# Patient Record
Sex: Female | Born: 1965 | Race: White | Hispanic: No | Smoking: Current every day smoker
Health system: Southern US, Community
[De-identification: ages and names within clinical notes are randomized; demographics above are authoritative.]

## PROBLEM LIST (undated history)

## (undated) DIAGNOSIS — I10 Essential (primary) hypertension: Secondary | ICD-10-CM

## (undated) DIAGNOSIS — F1010 Alcohol abuse, uncomplicated: Secondary | ICD-10-CM

## (undated) DIAGNOSIS — F329 Major depressive disorder, single episode, unspecified: Secondary | ICD-10-CM

## (undated) DIAGNOSIS — F32A Depression, unspecified: Secondary | ICD-10-CM

## (undated) DIAGNOSIS — N289 Disorder of kidney and ureter, unspecified: Secondary | ICD-10-CM

## (undated) HISTORY — PX: BREAST SURGERY: SHX581

## (undated) HISTORY — PX: OTHER SURGICAL HISTORY: SHX169

---

## 2009-09-08 ENCOUNTER — Ambulatory Visit (HOSPITAL_COMMUNITY): Admission: RE | Admit: 2009-09-08 | Discharge: 2009-09-08 | Payer: Self-pay | Admitting: General Surgery

## 2010-05-06 ENCOUNTER — Encounter: Payer: Self-pay | Admitting: Orthopaedic Surgery

## 2010-07-02 LAB — BASIC METABOLIC PANEL
BUN: 10 mg/dL (ref 6–23)
BUN: 13 mg/dL (ref 6–23)
CO2: 29 mEq/L (ref 19–32)
Calcium: 9.6 mg/dL (ref 8.4–10.5)
Chloride: 103 mEq/L (ref 96–112)
Creatinine, Ser: 0.71 mg/dL (ref 0.4–1.2)
GFR calc non Af Amer: >60 mL/min (ref >60)
Glucose, Bld: 101 mg/dL — ABNORMAL HIGH (ref 70–99)
Glucose, Bld: 107 mg/dL — ABNORMAL HIGH (ref 70–99)
Potassium: 4 mEq/L (ref 3.5–5.1)
Sodium: 139 mEq/L (ref 135–145)

## 2010-07-02 LAB — CULTURE, ROUTINE-ABSCESS

## 2010-07-02 LAB — ANAEROBIC CULTURE

## 2010-09-28 ENCOUNTER — Other Ambulatory Visit (HOSPITAL_COMMUNITY): Payer: Self-pay | Admitting: Family Medicine

## 2010-09-28 DIAGNOSIS — N6320 Unspecified lump in the left breast, unspecified quadrant: Secondary | ICD-10-CM

## 2010-10-24 ENCOUNTER — Encounter (HOSPITAL_COMMUNITY): Payer: Self-pay

## 2011-09-24 ENCOUNTER — Emergency Department (HOSPITAL_COMMUNITY)
Admission: EM | Admit: 2011-09-24 | Discharge: 2011-09-24 | Disposition: A | Discharge status: Home / Self Care | Payer: Self-pay | Attending: Emergency Medicine | Admitting: Emergency Medicine

## 2011-09-24 ENCOUNTER — Encounter (HOSPITAL_COMMUNITY): Payer: Self-pay | Admitting: *Deleted

## 2011-09-24 DIAGNOSIS — R000 Tachycardia, unspecified: Secondary | ICD-10-CM | POA: Insufficient documentation

## 2011-09-24 DIAGNOSIS — F17200 Nicotine dependence, unspecified, uncomplicated: Secondary | ICD-10-CM | POA: Insufficient documentation

## 2011-09-24 DIAGNOSIS — N289 Disorder of kidney and ureter, unspecified: Secondary | ICD-10-CM | POA: Insufficient documentation

## 2011-09-24 DIAGNOSIS — F1010 Alcohol abuse, uncomplicated: Secondary | ICD-10-CM | POA: Insufficient documentation

## 2011-09-24 DIAGNOSIS — F1020 Alcohol dependence, uncomplicated: Secondary | ICD-10-CM

## 2011-09-24 DIAGNOSIS — I10 Essential (primary) hypertension: Secondary | ICD-10-CM | POA: Insufficient documentation

## 2011-09-24 HISTORY — DX: Disorder of kidney and ureter, unspecified: N28.9

## 2011-09-24 HISTORY — DX: Essential (primary) hypertension: I10

## 2011-09-24 LAB — COMPREHENSIVE METABOLIC PANEL
BUN: 10 mg/dL (ref 6–23)
CO2: 25 mEq/L (ref 19–32)
Chloride: 100 mEq/L (ref 96–112)
Creatinine, Ser: 0.59 mg/dL (ref 0.50–1.10)
GFR calc non Af Amer: >90 mL/min (ref >90)
Glucose, Bld: 101 mg/dL — ABNORMAL HIGH (ref 70–99)
Total Bilirubin: 0.3 mg/dL (ref 0.3–1.2)

## 2011-09-24 LAB — RAPID URINE DRUG SCREEN, HOSP PERFORMED
Opiates: NOT DETECTED
Tetrahydrocannabinol: NOT DETECTED

## 2011-09-24 LAB — URINALYSIS, ROUTINE W REFLEX MICROSCOPIC
Hgb urine dipstick: NEGATIVE
Leukocytes, UA: NEGATIVE
Protein, ur: NEGATIVE mg/dL
Urobilinogen, UA: 0.2 mg/dL (ref 0.0–1.0)

## 2011-09-24 LAB — DIFFERENTIAL
Lymphocytes Relative: 43 % (ref 12–46)
Monocytes Absolute: 0.6 10*3/uL (ref 0.1–1.0)
Monocytes Relative: 8 % (ref 3–12)
Neutro Abs: 3.6 10*3/uL (ref 1.7–7.7)

## 2011-09-24 LAB — ETHANOL: Alcohol, Ethyl (B): 129 mg/dL — ABNORMAL HIGH (ref 0–11)

## 2011-09-24 LAB — CBC
HCT: 41.8 % (ref 36.0–46.0)
Hemoglobin: 14.4 g/dL (ref 12.0–15.0)
WBC: 8.1 10*3/uL (ref 4.0–10.5)

## 2011-09-24 MED ORDER — LORAZEPAM 2 MG/ML IJ SOLN
1.0000 mg | Freq: Once | INTRAMUSCULAR | Status: AC
  Administered 2011-09-24: 1 mg via INTRAVENOUS
  Filled 2011-09-24: qty 1

## 2011-09-24 MED ORDER — SODIUM CHLORIDE 0.9 % IV BOLUS (SEPSIS)
1000.0000 mL | Freq: Once | INTRAVENOUS | Status: AC
  Administered 2011-09-24: 1000 mL via INTRAVENOUS

## 2011-09-24 NOTE — ED Notes (Signed)
Sandy called at [facility]. Facility will expect pt in 1 hours time.

## 2011-09-24 NOTE — ED Notes (Signed)
Pt out to desk asking again if husband will be taking her, what the plan was and if she needed to stay. Pt continues to slur her words as well as smell of alcohol on her breath. Pt was cooperative and returned to her room when plan was discussed.

## 2011-09-24 NOTE — Progress Notes (Signed)
CALLED SANDY AT [facility]. PT IS ACCEPTED. CALLED KATIE OF [facility] WHO AUTHORIZED3 DAYS #[id number]. DR Ranae Palms AGREES WITH DISPOSITION.

## 2011-09-24 NOTE — ED Notes (Signed)
Pt is discharged to husband's care, to be transported to [facility] per ACT team acceptance in this program

## 2011-09-24 NOTE — ED Notes (Signed)
Pt out to desk asking if she could leave. Pt states "must be outside waiting for husband when he arrives as he has 4 children under the age of 63 with him".  Pt was asked to wait in her room, she asked to wait in the lobby. Pt quietly went back to her room on the third request.

## 2011-09-24 NOTE — BH Assessment (Signed)
Assessment Note   Kaylee Ochoa is an 46 y.o. female. The patient came to the ED requesting detox from alcohol She states that she has been drinking 4-6 glasses of wine daily for the last 20 years. She has had no period of sobriety. This has caused legal problems(DUI&DWI), job loss, loss of friends, and alienated  Her with her husband. She has been married 5 years, they have adopted 4 children. She reports that she has ruined many vacations by drinking. She is not suicidal and has no history of suicide attempts. She is not homicidal and has no history of violence. She is not psychotic. She has no history of seizures. She has spoken to [facility] earlier today and is requesting that she be referred there. Spoke With staff at [facility] and will fax referral information to them for review  Axis I: Alcohol Dependence; Substance induced mood disorder Axis II: Deferred Axis III:  Past Medical History  Diagnosis Date  . Hypertension   . Renal disorder    Axis IV: occupational problems and problems with primary support group Axis V: 41-50 serious symptoms  Past Medical History:  Past Medical History  Diagnosis Date  . Hypertension   . Renal disorder     History reviewed. No pertinent past surgical history.  Family History: History reviewed. No pertinent family history.  Social History:  reports that she has been smoking.  She does not have any smokeless tobacco history on file. She reports that she drinks alcohol. She reports that she does not use illicit drugs.  Additional Social History:     CIWA: CIWA-Ar BP: 153/92 mmHg Pulse Rate: 85  COWS:    Allergies:  Allergies  Allergen Reactions  . Other Hives, Itching and Rash    UNKNOWN ANTIBIOTIC: Patient cannot recall name of the medication but it casued a rash all over the body, hives, and itching    Home Medications:  (Not in a hospital admission)  OB/GYN Status:  Patient's last menstrual period was 09/10/2011.  General Assessment  Data Location of Assessment: [facility] ACT Assessment: Yes Living Arrangements: Spouse/significant other;Children Can pt return to current living arrangement?: Yes Admission Status: Voluntary Is patient capable of signing voluntary admission?: Yes Transfer from: Acute Hospital Referral Source: MD  Education Status Is patient currently in school?: No  Risk to self Suicidal Ideation: No Suicidal Intent: No Is patient at risk for suicide?: No Suicidal Plan?: No Access to Means: No What has been your use of drugs/alcohol within the last 12 months?: etoh daily for 20 years-2-6 glasses of wine daily Previous Attempts/Gestures: No How many times?: 0  Other Self Harm Risks: drinking Triggers for Past Attempts: None known Intentional Self Injurious Behavior: None Family Suicide History: No Recent stressful life event(s): Conflict (Comment);Job Loss;Turmoil (Comment) (conflicy with spouse over drinking) Persecutory voices/beliefs?: No Depression: Yes Depression Symptoms: Insomnia;Tearfulness;Isolating;Loss of interest in usual pleasures;Feeling worthless/self pity Substance abuse history and/or treatment for substance abuse?: Yes Suicide prevention information given to non-admitted patients: Yes  Risk to Others Homicidal Ideation: No Thoughts of Harm to Others: No Current Homicidal Intent: No Current Homicidal Plan: No Access to Homicidal Means: No History of harm to others?: No Assessment of Violence: None Noted Does patient have access to weapons?: No Criminal Charges Pending?: No Does patient have a court date: No  Psychosis Hallucinations: None noted Delusions: None noted  Mental Status Report Appear/Hygiene: Improved Eye Contact: Good Motor Activity: Restlessness Speech: Logical/coherent;Soft Level of Consciousness: Alert;Crying Mood: Depressed;Anxious;Guilty;Fearful Affect: Anxious;Depressed;Fearful  Anxiety Level: Minimal Thought Processes:  Coherent;Relevant Judgement: Unimpaired Orientation: Person;Place;Time;Situation Obsessive Compulsive Thoughts/Behaviors: Minimal  Cognitive Functioning Concentration: Decreased Memory: Recent Intact;Remote Intact IQ: Average Insight: Poor Impulse Control: Poor Appetite: Fair Sleep: No Change Vegetative Symptoms: None  ADLScreening [facility] Assessment Services) Patient's cognitive ability adequate to safely complete daily activities?: Yes Patient able to express need for assistance with ADLs?: Yes Independently performs ADLs?: Yes  Abuse/Neglect [facility]) Physical Abuse: Denies Verbal Abuse: Denies Sexual Abuse: Denies  Prior Inpatient Therapy Prior Inpatient Therapy: No  Prior Outpatient Therapy Prior Outpatient Therapy: Yes Prior Therapy Facilty/Provider(s): Twice in [place];1 in [place] [facility] Reason for Treatment: IOP  ADL Screening (condition at time of admission) Patient's cognitive ability adequate to safely complete daily activities?: Yes Patient able to express need for assistance with ADLs?: Yes Independently performs ADLs?: Yes       Abuse/Neglect Assessment (Assessment to be complete while patient is alone) Physical Abuse: Denies Verbal Abuse: Denies Sexual Abuse: Denies Values / Beliefs Cultural Requests During Hospitalization: None Spiritual Requests During Hospitalization: None        Additional Information 1:1 In Past 12 Months?: No CIRT Risk: No Elopement Risk: No Does patient have medical clearance?: Yes     Disposition:  Disposition Disposition of Patient: Inpatient treatment program;Referred to Patient referred to: [facility]  On Site Evaluation by:   Reviewed with Physician:     Jearld Pies 09/24/2011 4:16 PM

## 2011-09-24 NOTE — ED Provider Notes (Addendum)
History  This chart was scribed for [name], MD by Bennett Scrape. This patient was seen in room APA08/[facility]08 and the patient's care was started at 2:11PM.  CSN: [id number]  Arrival date & time 09/24/11  1342   First MD Initiated Contact with Patient 09/24/11 1411      No chief complaint on file.   The history is provided by the patient. No language interpreter was used.    Kaylee Ochoa is a 46 y.o. female who presents to the Emergency Department requesting help with EtOH detox. Pt states that she has been consuming 4 to 6 glasses of wine a day for the past 20 years. She reports that she has tried detox 10 times before without success. She states that she had half a glass of wine around 1PM today. She denies SI or hallucinations. She denies having a h/o psychiatric  problems. She states that she has a h/o HTN but does not take medications for it. She is a current everyday smoker.  Past Medical History  Diagnosis Date  . Hypertension   . Renal disorder     History reviewed. No pertinent past surgical history.  History reviewed. No pertinent family history.  History  Substance Use Topics  . Smoking status: Current Everyday Smoker  . Smokeless tobacco: Not on file  . Alcohol Use: Yes    Review of Systems  Constitutional: Negative for fatigue.  HENT: Negative for congestion, sinus pressure and ear discharge.   Eyes: Negative for discharge.  Respiratory: Negative for cough.   Cardiovascular: Negative for chest pain.  Gastrointestinal: Negative for abdominal pain and diarrhea.  Genitourinary: Negative for frequency and hematuria.  Musculoskeletal: Negative for back pain.  Skin: Negative for rash.  Neurological: Negative for seizures and headaches.  Hematological: Negative.   Psychiatric/Behavioral: Negative for suicidal ideas and hallucinations. The patient is nervous/anxious.     Allergies  Review of patient's allergies indicates not on file.  Home  Medications  No current outpatient prescriptions on file.  Triage Vitals: BP 184/114  Pulse 110  Temp(Src) 98.7 F (37.1 C) (Oral)  Resp 20  Ht 5' 7.5" (1.715 m)  Wt 131 lb (59.421 kg)  BMI 20.21 kg/m2  SpO2 98%  LMP 09/10/2011  Physical Exam  Nursing note and vitals reviewed. Constitutional: She is oriented to person, place, and time. She appears well-developed and well-nourished.       Mild tremors  HENT:  Head: Normocephalic and atraumatic.  Eyes: Conjunctivae and EOM are normal. No scleral icterus.  Neck: Neck supple.  Cardiovascular: Regular rhythm.  Tachycardia present.  Exam reveals no gallop and no friction rub.   No murmur heard. Pulmonary/Chest: Effort normal and breath sounds normal.  Abdominal: Soft. She exhibits no distension.  Musculoskeletal: Normal range of motion. She exhibits no edema.  Neurological: She is alert and oriented to person, place, and time. Coordination normal.  Skin: No rash noted. No erythema.  Psychiatric: Her behavior is normal. Her mood appears anxious.    ED Course  Procedures (including critical care time)  DIAGNOSTIC STUDIES: Oxygen Saturation is 98% on room air, normal by my interpretation.    COORDINATION OF CARE: 2:19PM-Discussed treatment plan with pt and pt agreed to plan. 3:55PM-Per ACT team member, pt is having withdrawals.   Labs Reviewed  COMPREHENSIVE METABOLIC PANEL - Abnormal; Notable for the following:    Glucose, Bld 101 (*)    AST 57 (*)    All other components within normal limits  URINALYSIS,  ROUTINE W REFLEX MICROSCOPIC - Abnormal; Notable for the following:    Specific Gravity, Urine <1.005 (*)    All other components within normal limits  ETHANOL - Abnormal; Notable for the following:    Alcohol, Ethyl (B) 239 (*)    All other components within normal limits  CBC  DIFFERENTIAL  URINE RAPID DRUG SCREEN (HOSP PERFORMED)   No results found.   No diagnosis found.    MDM  I personally performed the  services described in this documentation, which was scribed in my presence. The recorded information has been reviewed and considered.  Awaiting rehab placement.       [name], MD 09/24/11 2542  [name], MD 09/30/11 [phone]

## 2011-09-24 NOTE — ED Notes (Signed)
ACT team member notified of alcohol level results.

## 2011-09-24 NOTE — ED Notes (Signed)
Kaylee Ochoa From the ACT team at bedside talking to pt.

## 2011-09-24 NOTE — ED Notes (Signed)
Pt here for detox from etoh.  Drinks 4-6 glasses of wine daily.  "my husband wants me to detox"

## 2012-05-26 ENCOUNTER — Emergency Department (HOSPITAL_COMMUNITY)
Admission: EM | Admit: 2012-05-26 | Discharge: 2012-05-26 | Disposition: A | Discharge status: Home / Self Care | Payer: BC Managed Care – PPO | Source: Home / Self Care | Attending: Emergency Medicine | Admitting: Emergency Medicine

## 2012-05-26 ENCOUNTER — Emergency Department (HOSPITAL_COMMUNITY)
Admission: EM | Admit: 2012-05-26 | Discharge: 2012-05-26 | Disposition: A | Discharge status: Other Acute Inpatient Hospital | Payer: BC Managed Care – PPO | Attending: Emergency Medicine | Admitting: Emergency Medicine

## 2012-05-26 ENCOUNTER — Inpatient Hospital Stay (HOSPITAL_COMMUNITY)
Admission: EM | Admit: 2012-05-26 | Discharge: 2012-05-27 | DRG: 897 | Disposition: A | Discharge status: Home / Self Care | Payer: 59 | Source: Intra-hospital | Attending: Psychiatry | Admitting: Psychiatry

## 2012-05-26 ENCOUNTER — Encounter (HOSPITAL_COMMUNITY): Payer: Self-pay | Admitting: Emergency Medicine

## 2012-05-26 ENCOUNTER — Encounter (HOSPITAL_COMMUNITY): Payer: Self-pay

## 2012-05-26 DIAGNOSIS — I10 Essential (primary) hypertension: Secondary | ICD-10-CM | POA: Diagnosis present

## 2012-05-26 DIAGNOSIS — F3289 Other specified depressive episodes: Secondary | ICD-10-CM | POA: Insufficient documentation

## 2012-05-26 DIAGNOSIS — Z79899 Other long term (current) drug therapy: Secondary | ICD-10-CM | POA: Insufficient documentation

## 2012-05-26 DIAGNOSIS — R45851 Suicidal ideations: Secondary | ICD-10-CM | POA: Insufficient documentation

## 2012-05-26 DIAGNOSIS — F17200 Nicotine dependence, unspecified, uncomplicated: Secondary | ICD-10-CM | POA: Insufficient documentation

## 2012-05-26 DIAGNOSIS — F1010 Alcohol abuse, uncomplicated: Secondary | ICD-10-CM | POA: Insufficient documentation

## 2012-05-26 DIAGNOSIS — F10239 Alcohol dependence with withdrawal, unspecified: Principal | ICD-10-CM | POA: Diagnosis present

## 2012-05-26 DIAGNOSIS — Z8659 Personal history of other mental and behavioral disorders: Secondary | ICD-10-CM | POA: Insufficient documentation

## 2012-05-26 DIAGNOSIS — Z87448 Personal history of other diseases of urinary system: Secondary | ICD-10-CM | POA: Insufficient documentation

## 2012-05-26 DIAGNOSIS — F10939 Alcohol use, unspecified with withdrawal, unspecified: Principal | ICD-10-CM | POA: Diagnosis present

## 2012-05-26 DIAGNOSIS — N289 Disorder of kidney and ureter, unspecified: Secondary | ICD-10-CM | POA: Insufficient documentation

## 2012-05-26 DIAGNOSIS — F1020 Alcohol dependence, uncomplicated: Secondary | ICD-10-CM | POA: Diagnosis present

## 2012-05-26 DIAGNOSIS — F329 Major depressive disorder, single episode, unspecified: Secondary | ICD-10-CM | POA: Insufficient documentation

## 2012-05-26 HISTORY — DX: Depression, unspecified: F32.A

## 2012-05-26 HISTORY — DX: Major depressive disorder, single episode, unspecified: F32.9

## 2012-05-26 LAB — COMPREHENSIVE METABOLIC PANEL
ALT: 15 U/L (ref 0–35)
AST: 33 U/L (ref 0–37)
Alkaline Phosphatase: 77 U/L (ref 39–117)
CO2: 26 mEq/L (ref 19–32)
Chloride: 102 mEq/L (ref 96–112)
GFR calc non Af Amer: >90 mL/min (ref >90)
Potassium: 4.1 mEq/L (ref 3.5–5.1)
Sodium: 141 mEq/L (ref 135–145)
Total Bilirubin: 0.4 mg/dL (ref 0.3–1.2)

## 2012-05-26 LAB — RAPID URINE DRUG SCREEN, HOSP PERFORMED
Amphetamines: NOT DETECTED
Barbiturates: NOT DETECTED
Tetrahydrocannabinol: NOT DETECTED

## 2012-05-26 LAB — CBC WITH DIFFERENTIAL/PLATELET
Basophils Absolute: 0.1 10*3/uL (ref 0.0–0.1)
HCT: 44.2 % (ref 36.0–46.0)
Lymphocytes Relative: 43 % (ref 12–46)
Monocytes Absolute: 0.6 10*3/uL (ref 0.1–1.0)
Neutro Abs: 3.2 10*3/uL (ref 1.7–7.7)
Neutrophils Relative %: 46 % (ref 43–77)
RDW: 12.8 % (ref 11.5–15.5)
WBC: 7 10*3/uL (ref 4.0–10.5)

## 2012-05-26 MED ORDER — ALBUTEROL SULFATE HFA 108 (90 BASE) MCG/ACT IN AERS: 2.0000 | INHALATION_SPRAY | Freq: Four times a day (QID) | RESPIRATORY_TRACT | Status: DC | PRN

## 2012-05-26 MED ORDER — THIAMINE HCL 100 MG/ML IJ SOLN: 100.0000 mg | Freq: Every day | INTRAMUSCULAR | Status: DC

## 2012-05-26 MED ORDER — LORAZEPAM 1 MG PO TABS
0.0000 mg | ORAL_TABLET | Freq: Four times a day (QID) | ORAL | Status: DC
  Administered 2012-05-26: 1 mg via ORAL
  Filled 2012-05-26: qty 1

## 2012-05-26 MED ORDER — ONDANSETRON 4 MG PO TBDP
4.0000 mg | ORAL_TABLET | Freq: Once | ORAL | Status: AC
  Administered 2012-05-26: 4 mg via ORAL
  Filled 2012-05-26: qty 1

## 2012-05-26 MED ORDER — VITAMIN B-1 100 MG PO TABS
100.0000 mg | ORAL_TABLET | Freq: Every day | ORAL | Status: DC
  Administered 2012-05-26: 100 mg via ORAL
  Filled 2012-05-26: qty 1

## 2012-05-26 MED ORDER — LORAZEPAM 1 MG PO TABS: 0.0000 mg | ORAL_TABLET | Freq: Two times a day (BID) | ORAL | Status: DC

## 2012-05-26 MED ORDER — LORAZEPAM 2 MG/ML IJ SOLN: 1.0000 mg | Freq: Four times a day (QID) | INTRAMUSCULAR | Status: DC | PRN

## 2012-05-26 MED ORDER — SODIUM CHLORIDE 0.9 % IV BOLUS (SEPSIS): 1000.0000 mL | INTRAVENOUS | Status: DC

## 2012-05-26 MED ORDER — FOLIC ACID 1 MG PO TABS
1.0000 mg | ORAL_TABLET | Freq: Every day | ORAL | Status: DC
  Administered 2012-05-26: 1 mg via ORAL
  Filled 2012-05-26: qty 1

## 2012-05-26 MED ORDER — LORAZEPAM 1 MG PO TABS
1.0000 mg | ORAL_TABLET | Freq: Four times a day (QID) | ORAL | Status: DC | PRN
  Administered 2012-05-26: 1 mg via ORAL
  Filled 2012-05-26: qty 1

## 2012-05-26 MED ORDER — ADULT MULTIVITAMIN W/MINERALS CH
1.0000 | ORAL_TABLET | Freq: Every day | ORAL | Status: DC
  Administered 2012-05-26: 1 via ORAL
  Filled 2012-05-26: qty 1

## 2012-05-26 NOTE — ED Provider Notes (Signed)
Medical screening examination/treatment/procedure(s) were performed by non-physician practitioner and as supervising physician I was immediately available for consultation/collaboration.   Kassey Laforest, MD 05/26/12 2329 

## 2012-05-26 NOTE — ED Notes (Signed)
Was here earlier via EMS and labs were being resulted when pt stated that she didn't want to stay. Rob PA-C tried to talk pt into going through detox program here and she declined. Pt returned after about 30 mins and stated she was ready to go through the program.

## 2012-05-26 NOTE — ED Provider Notes (Signed)
History  This chart was scribed for non-physician practitioner working with [name], MD by Candelaria Stagers, ED Scribe. This patient was seen in room WTR3/[facility]PT3 and the patient's care was started at 3:46 PM   CSN: [id number]  Arrival date & time 05/26/12  1433   First MD Initiated Contact with Patient 05/26/12 1544      Chief Complaint  Patient presents with   Alcohol Intoxication    .320   Suicidal    PER EMS- stated to staff that she desired to harm self at [facility]     The history is provided by the patient and the EMS personnel. No language interpreter was used.   Kaylee Ochoa is a 47 y.o. female who presents to the Emergency Department BIBA from [facility], a detox center, after she expressed SI to the staff there earlier today.  Pt was at [facility] for ETOH detox.  Pt reports that she drank earlier today before going to the treatment center.  She denies SI/HI and reports she was just upset regarding a fight with her husband when she stated SI this morning.  She expresses desire to return to the treatment center.  She denies any pain at this time.  Pt has h/o HTN.  Pt denies use of recreational drugs.           Past Medical History  Diagnosis Date   Hypertension    Renal disorder     No past surgical history on file.  No family history on file.  History  Substance Use Topics   Smoking status: Current Every Day Smoker   Smokeless tobacco: Not on file   Alcohol Use: Yes    OB History   Grav Para Term Preterm Abortions TAB SAB Ect Mult Living                  Review of Systems  Musculoskeletal: Negative for arthralgias.  Psychiatric/Behavioral: Negative for suicidal ideas, self-injury and agitation.  All other systems reviewed and are negative.    Allergies  Other and Sulfa antibiotics  Home Medications   Current Outpatient Rx  Name  Route  Sig  Dispense  Refill   albuterol (VENTOLIN HFA) 108 (90 BASE) MCG/ACT inhaler  Inhalation   Inhale 2 puffs into the lungs every 6 (six) hours as needed.           BP 155/80   Pulse 94   Temp(Src) 98.2 F (36.8 C)   Resp 18   SpO2 100%   LMP 05/12/2012  Physical Exam  Nursing note and vitals reviewed. Constitutional: She is oriented to person, place, and time. She appears well-developed and well-nourished. No distress.  HENT:  Head: Normocephalic and atraumatic.  Eyes: EOM are normal.  Neck: Neck supple. No tracheal deviation present.  Cardiovascular: Normal rate, regular rhythm and normal heart sounds.  Exam reveals no gallop and no friction rub.   No murmur heard. Pulmonary/Chest: Effort normal and breath sounds normal. No respiratory distress. She has no wheezes. She has no rales. She exhibits no tenderness.  Abdominal: Soft. She exhibits no distension and no mass. There is no tenderness. There is no rebound and no guarding.  Musculoskeletal: Normal range of motion.  Neurological: She is alert and oriented to person, place, and time.  CN 3-12 intact.    Skin: Skin is warm and dry.  Psychiatric: She has a normal mood and affect. Her behavior is normal. Judgment and thought content normal. She expresses  no homicidal and no suicidal ideation. She expresses no suicidal plans and no homicidal plans.    ED Course  Procedures   DIAGNOSTIC STUDIES: Oxygen Saturation is 100% on room air, normal by my interpretation.    COORDINATION OF CARE:  3:37 PM Ordered: CBC with Differential; Comprehensive metabolic panel; Drug screen panel, emergency; Ethanol 3:59 PM Will consult with [name], MD.  Pt understands and agrees.    Labs Reviewed  CBC WITH DIFFERENTIAL - Abnormal; Notable for the following:    Hemoglobin 15.1 (*)    MCV 101.4 (*)    MCH 34.6 (*)    All other components within normal limits  COMPREHENSIVE METABOLIC PANEL  ETHANOL   No results found.   1. Alcohol abuse       MDM  This is a 47 year old female, who presents emergency  department with chief complaint of wanting to detox from alcohol. Patient states that she is being seen at the detox center, when she became frustrated, and said, "I just want to kill myself."  I spent approximately 15-20 minutes talking with the patient. I do not feel that the patient is suicidal at this time. She denies any desire to kill herself or harm anyone. The patient has never tried to commit suicide before. She does not live alone. She has a husband and 4 kids, and states that she would never want to kill herself. She states that she had just become frustrated because of an argument with her husband, and in the heat of the moment said that she wanted to. She does not have a plan. She tells me that she just wants to go back to the treatment center, so that she can finish going through treatment for alcohol abuse and put her life back together.  I discussed this patient with Dr. Patria Mane, who agrees with the plan.  I personally performed the services described in this documentation, which was scribed in my presence. The recorded information has been reviewed and is accurate.        [name], PA-C 05/26/12 [phone]

## 2012-05-26 NOTE — ED Notes (Signed)
Pt very upset states wants rehab, pt now denies SI/HI, pt wanting to leave.

## 2012-05-26 NOTE — ED Notes (Signed)
Patient Informed that we are past due for a urine sample. Patient given a cup of water to drink.

## 2012-05-26 NOTE — ED Notes (Signed)
Upon accepting patient there was past due orders on chart. Written contacted [place], [place] to see if he still wanted 1000 ml bolus. Pa reported that bolus could be cancel. Bolus cancel by Clinical research associate per verbal order. Remaining orders will be perform by writter.

## 2012-05-26 NOTE — ED Provider Notes (Signed)
History  This chart was scribed for non-physician practitioner working with [name], MD by Candelaria Stagers, ED Scribe. This patient was seen in room WTR3/[facility]PT3 and the patient's care was started at 3:46 PM   CSN: [id number]  Arrival date & time 05/26/12  1433   First MD Initiated Contact with Patient 05/26/12 1544      Chief Complaint  Patient presents with  . Alcohol Intoxication    .320  . Suicidal    PER EMS- stated to staff that she desired to harm self at [facility]     Alcohol Intoxication Pertinent negatives include no arthralgias.  Patient is a 47 y.o. female presenting with intoxication. The history is provided by the patient and the EMS personnel. No language interpreter was used.   Kaylee Ochoa is a 47 y.o. female who presents to the Emergency Department BIBA from [facility], a detox center, after she expressed SI to the staff there earlier today.  Pt was at [facility] for ETOH detox.  Pt reports that she drank earlier today before going to the treatment center.  She denies SI/HI and reports she was just upset regarding a fight with her husband when she stated SI this morning.  She expresses desire to return to the treatment center.  She denies any pain at this time.  Pt has h/o HTN.  Pt denies use of recreational drugs.           Past Medical History  Diagnosis Date  . Hypertension   . Renal disorder     No past surgical history on file.  No family history on file.  History  Substance Use Topics  . Smoking status: Current Every Day Smoker  . Smokeless tobacco: Not on file  . Alcohol Use: Yes    OB History   Grav Para Term Preterm Abortions TAB SAB Ect Mult Living                  Review of Systems  Musculoskeletal: Negative for arthralgias.  Psychiatric/Behavioral: Negative for suicidal ideas, self-injury and agitation.  All other systems reviewed and are negative.    Allergies  Other and Sulfa antibiotics  Home  Medications   Current Outpatient Rx  Name  Route  Sig  Dispense  Refill  . albuterol (VENTOLIN HFA) 108 (90 BASE) MCG/ACT inhaler   Inhalation   Inhale 2 puffs into the lungs every 6 (six) hours as needed.           BP 155/80  Pulse 94  Temp(Src) 98.2 F (36.8 C)  Resp 18  SpO2 100%  LMP 05/12/2012  Physical Exam  Nursing note and vitals reviewed. Constitutional: She is oriented to person, place, and time. She appears well-developed and well-nourished. No distress.  HENT:  Head: Normocephalic and atraumatic.  Eyes: EOM are normal.  Neck: Neck supple. No tracheal deviation present.  Cardiovascular: Normal rate, regular rhythm and normal heart sounds.  Exam reveals no gallop and no friction rub.   No murmur heard. Pulmonary/Chest: Effort normal and breath sounds normal. No respiratory distress. She has no wheezes. She has no rales. She exhibits no tenderness.  Abdominal: Soft. She exhibits no distension and no mass. There is no tenderness. There is no rebound and no guarding.  Musculoskeletal: Normal range of motion.  Neurological: She is alert and oriented to person, place, and time.  CN 3-12 intact.    Skin: Skin is warm and dry.  Psychiatric: She has a normal  mood and affect. Her behavior is normal. Judgment and thought content normal. She expresses no homicidal and no suicidal ideation. She expresses no suicidal plans and no homicidal plans.    ED Course  Procedures   DIAGNOSTIC STUDIES: Oxygen Saturation is 100% on room air, normal by my interpretation.    COORDINATION OF CARE:  3:37 PM Ordered: CBC with Differential; Comprehensive metabolic panel; Drug screen panel, emergency; Ethanol 3:59 PM Will consult with [name], MD.  Pt understands and agrees.    Labs Reviewed  CBC WITH DIFFERENTIAL - Abnormal; Notable for the following:    Hemoglobin 15.1 (*)    MCV 101.4 (*)    MCH 34.6 (*)    All other components within normal limits  ETHANOL - Abnormal;  Notable for the following:    Alcohol, Ethyl (B) 301 (*)    All other components within normal limits  COMPREHENSIVE METABOLIC PANEL   No results found.   1. Alcohol abuse       MDM  This is a 47 year old female, who presents emergency department with chief complaint of wanting to detox from alcohol. Patient states that she is being seen at the detox center, when she became frustrated, and said, "I just want to kill myself."  I spent approximately 15-20 minutes talking with the patient. I do not feel that the patient is suicidal at this time. She denies any desire to kill herself or harm anyone. The patient has never tried to commit suicide before. She does not live alone. She has a husband and 4 kids, and states that she would never want to kill herself. She states that she had just become frustrated because of an argument with her husband, and in the heat of the moment said that she wanted to. She does not have a plan. She tells me that she just wants to go back to the treatment center, so that she can finish going through treatment for alcohol abuse and put her life back together.  I discussed this patient with Dr. Patria Mane, who agrees with the plan.  I personally performed the services described in this documentation, which was scribed in my presence. The recorded information has been reviewed and is accurate.  5:27 PM [facility] tells me that they do not want to take the patient back with an elevated BAL.  They recommended that the patient start treatment here, and then be moved tomorrow.  I discussed the option with the patient, and she states that she does not want to stay for detox.  The patient maintains that she is not suicidal.  She states that she will follow up with [name] tomorrow.  I have discussed this with Dr. Patria Mane, who agrees that she maybe discharged at this time, and can follow up with [facility] tomorrow.          [name],  PA-C 05/26/12 1618  [name], PA-C 05/26/12 [phone]

## 2012-05-26 NOTE — ED Provider Notes (Signed)
Medical screening examination/treatment/procedure(s) were performed by non-physician practitioner and as supervising physician I was immediately available for consultation/collaboration.   Lyanne Co, MD 05/26/12 2258

## 2012-05-26 NOTE — BH Assessment (Signed)
Assessment Note   Kaylee Ochoa is a 47 y.o. female presenting to [facility] for detox from alcohol.  Pt denies SI/HI/Psych. Pt presented earlier to emerg dept for help with detox, however she left and has now returned stating that she is ready for detox treatment.  Pt reports relapse after 27 days sobriety and cites no precip event for relapse.  Pt consumes 2 bottles of wine daily, last use was 05/26/12, pt says she drank 2 glasses of wine, however BAL shows 301 at 1600 when drawn.  Pt denies any current w/d sxs, no problems with seizures/blackouts and says there are no other SA issues.  Pt.'s last admission for detox was in 2013 with [facility].     Axis I: Alcohol Dependence  Axis II: Deferred Axis III:  Past Medical History  Diagnosis Date  . Hypertension   . Renal disorder   . Depression    Axis IV: other psychosocial or environmental problems, problems related to social environment and problems with primary support group Axis V: 51-60 moderate symptoms  Past Medical History:  Past Medical History  Diagnosis Date  . Hypertension   . Renal disorder   . Depression     History reviewed. No pertinent past surgical history.  Family History: No family history on file.  Social History:  reports that she has been smoking.  She does not have any smokeless tobacco history on file. She reports that  drinks alcohol. She reports that she does not use illicit drugs.  Additional Social History:  Alcohol / Drug Use Pain Medications: None  Prescriptions: None  Over the Counter: none  History of alcohol / drug use?: Yes Longest period of sobriety (when/how long): 27 Days  Negative Consequences of Use: Financial;Personal relationships Withdrawal Symptoms: Other (Comment) Substance #1 Name of Substance 1: Alcohol--Wine  1 - Age of First Use: 45 YOF  1 - Amount (size/oz): 2 Bottles 1 - Frequency: Daily  1 - Duration: On-going  1 - Last Use / Amount: 05/26/12  CIWA: CIWA-Ar BP: 154/87 mmHg Pulse  Rate: 102 Nausea and Vomiting: no nausea and no vomiting Tactile Disturbances: none Tremor: no tremor Auditory Disturbances: not present Paroxysmal Sweats: no sweat visible Visual Disturbances: not present Anxiety: no anxiety, at ease Headache, Fullness in Head: none present Agitation: normal activity Orientation and Clouding of Sensorium: oriented and can do serial additions CIWA-Ar Total: 0 COWS:    Allergies:  Allergies  Allergen Reactions  . Other Hives, Itching and Rash    UNKNOWN ANTIBIOTIC: Patient cannot recall name of the medication but it casued a rash all over the body, hives, and itching  . Sulfa Antibiotics Hives    Home Medications:  (Not in a hospital admission)  OB/GYN Status:  Patient's last menstrual period was 05/12/2012.  General Assessment Data Location of Assessment: [facility] Living Arrangements: Alone Can pt return to current living arrangement?: Yes Admission Status: Voluntary Is patient capable of signing voluntary admission?: Yes Transfer from: Acute Hospital Referral Source: MD  Education Status Is patient currently in school?: No Current Grade: None  Highest grade of school patient has completed: None  Name of school: None  Contact person: None   Risk to self Suicidal Ideation: No Suicidal Intent: No Is patient at risk for suicide?: No Suicidal Plan?: No Access to Means: No What has been your use of drugs/alcohol within the last 12 months?: Abusing alcohol  Previous Attempts/Gestures: No How many times?: 0 Other Self Harm Risks: None  Triggers for  Past Attempts: None known Intentional Self Injurious Behavior: None Family Suicide History: No Recent stressful life event(s): Other (Comment) (Relapse after 27 days sober) Persecutory voices/beliefs?: No Depression: Yes Depression Symptoms: Loss of interest in usual pleasures Substance abuse history and/or treatment for substance abuse?: Yes Suicide prevention information given to  non-admitted patients: Not applicable  Risk to Others Homicidal Ideation: No Thoughts of Harm to Others: No Current Homicidal Intent: No Current Homicidal Plan: No Access to Homicidal Means: No Identified Victim: None  History of harm to others?: No Assessment of Violence: None Noted Violent Behavior Description: None  Does patient have access to weapons?: No Criminal Charges Pending?: No Does patient have a court date: No  Psychosis Hallucinations: None noted Delusions: None noted  Mental Status Report Appear/Hygiene: Disheveled Eye Contact: Fair Motor Activity: Unremarkable Speech: Logical/coherent;Soft Level of Consciousness: Alert Mood: Depressed Affect: Depressed Anxiety Level: None Thought Processes: Coherent;Relevant Judgement: Unimpaired Orientation: Person;Place;Time;Situation Obsessive Compulsive Thoughts/Behaviors: None  Cognitive Functioning Concentration: Normal Memory: Recent Intact;Remote Intact IQ: Average Insight: Fair Impulse Control: Fair Appetite: Good Weight Loss: 0 Weight Gain: 0 Sleep: No Change Total Hours of Sleep: 6 Vegetative Symptoms: None  ADLScreening [facility]) Patient's cognitive ability adequate to safely complete daily activities?: Yes Patient able to express need for assistance with ADLs?: Yes Independently performs ADLs?: Yes (appropriate for developmental age)  Abuse/Neglect [facility]) Physical Abuse: Denies Verbal Abuse: Denies Sexual Abuse: Denies  Prior Inpatient Therapy Prior Inpatient Therapy: Yes Prior Therapy Dates: 2013 Prior Therapy Facilty/Provider(s): [facility] Reason for Treatment: Detox   Prior Outpatient Therapy Prior Outpatient Therapy: No Prior Therapy Dates: None  Prior Therapy Facilty/Provider(s): None  Reason for Treatment: None   ADL Screening (condition at time of admission) Patient's cognitive ability adequate to safely complete daily activities?: Yes Patient able to express need for  assistance with ADLs?: Yes Independently performs ADLs?: Yes (appropriate for developmental age) Weakness of Legs: None Weakness of Arms/Hands: None  Home Assistive Devices/Equipment Home Assistive Devices/Equipment: None  Therapy Consults (therapy consults require a physician order) PT Evaluation Needed: No OT Evalulation Needed: No SLP Evaluation Needed: No Abuse/Neglect Assessment (Assessment to be complete while patient is alone) Physical Abuse: Denies Verbal Abuse: Denies Sexual Abuse: Denies Exploitation of patient/patient's resources: Denies Self-Neglect: Denies Values / Beliefs Cultural Requests During Hospitalization: None Spiritual Requests During Hospitalization: None Consults Spiritual Care Consult Needed: No Social Work Consult Needed: No Merchant navy officer (For Healthcare) Advance Directive: Patient does not have advance directive;Patient would not like information Nutrition Screen- [facility] Adult/[facility] Patient's home diet: Regular Have you recently lost weight without trying?: No Have you been eating poorly because of a decreased appetite?: No Malnutrition Screening Tool Score: 0  Additional Information 1:1 In Past 12 Months?: No CIRT Risk: No Elopement Risk: No Does patient have medical clearance?: Yes     Disposition:  Disposition Disposition of Patient: Inpatient treatment program;Referred to [facility] ) Type of inpatient treatment program: Adult Patient referred to: Other (Comment) [facility] )  On Site Evaluation by:   Reviewed with Physician:     Murrell Redden 05/26/2012 9:35 PM

## 2012-05-26 NOTE — ED Notes (Signed)
PER EMS : Pt was transported via EMS # 41, from [facility]. Pt has a ETOH level of .320. Pt is alert, oriented and cooperative

## 2012-05-26 NOTE — ED Provider Notes (Signed)
History  This chart was scribed for non-physician practitioner working with [name], MD by Candelaria Stagers, ED Scribe. This patient was seen in room [facility] and the patient's care was started at 3:46 PM   CSN: [id number]  Arrival date & time 05/26/12  1732   First MD Initiated Contact with Patient 05/26/12 1544      No chief complaint on file.    Patient is a 47 y.o. female presenting with intoxication. The history is provided by the patient and the EMS personnel. No language interpreter was used.  Alcohol Intoxication Pertinent negatives include no arthralgias.   Kaylee Ochoa is a 47 y.o. female who presents to the emergency department for alcohol detox. Patient was seen here earlier today for the same, but he said not to pursue detox time. She now states that she would like to start detox here, and then continued tomorrow at [facility].  Pt reports that she drank earlier today before going to the treatment center.  She denies SI/HI and reports she was just upset regarding a fight with her husband when she stated SI this morning.  She expresses desire to return to the treatment center.  She denies any pain at this time.  Pt has h/o HTN.  Pt denies use of recreational drugs.           Past Medical History  Diagnosis Date  . Hypertension   . Renal disorder     No past surgical history on file.  No family history on file.  History  Substance Use Topics  . Smoking status: Current Every Day Smoker  . Smokeless tobacco: Not on file  . Alcohol Use: Yes    OB History   Grav Para Term Preterm Abortions TAB SAB Ect Mult Living                  Review of Systems  Musculoskeletal: Negative for arthralgias.  Psychiatric/Behavioral: Negative for suicidal ideas, self-injury and agitation.  All other systems reviewed and are negative.    Allergies  Other and Sulfa antibiotics  Home Medications   Current Outpatient Rx  Name  Route  Sig  Dispense  Refill  .  albuterol (VENTOLIN HFA) 108 (90 BASE) MCG/ACT inhaler   Inhalation   Inhale 2 puffs into the lungs every 6 (six) hours as needed.           BP 155/80  Pulse 94  Temp(Src) 98.2 F (36.8 C)  Resp 18  SpO2 100%  LMP 05/12/2012  Physical Exam  Nursing note and vitals reviewed. Constitutional: She is oriented to person, place, and time. She appears well-developed and well-nourished. No distress.  HENT:  Head: Normocephalic and atraumatic.  Eyes: EOM are normal.  Neck: Neck supple. No tracheal deviation present.  Cardiovascular: Normal rate, regular rhythm and normal heart sounds.  Exam reveals no gallop and no friction rub.   No murmur heard. Pulmonary/Chest: Effort normal and breath sounds normal. No respiratory distress. She has no wheezes. She has no rales. She exhibits no tenderness.  Abdominal: Soft. She exhibits no distension and no mass. There is no tenderness. There is no rebound and no guarding.  Musculoskeletal: Normal range of motion.  Neurological: She is alert and oriented to person, place, and time.  CN 3-12 intact.    Skin: Skin is warm and dry.  Psychiatric: She has a normal mood and affect. Her behavior is normal. Judgment and thought content normal. She expresses no homicidal and no  suicidal ideation. She expresses no suicidal plans and no homicidal plans.    ED Course  Procedures   DIAGNOSTIC STUDIES: Oxygen Saturation is 100% on room air, normal by my interpretation.    COORDINATION OF CARE:  3:37 PM Ordered: CBC with Differential; Comprehensive metabolic panel; Drug screen panel, emergency; Ethanol 3:59 PM Will consult with [name], MD.  Pt understands and agrees.    Labs Reviewed  URINE RAPID DRUG SCREEN (HOSP PERFORMED)   No results found.   No diagnosis found.    MDM  Please see my previous note for more information. Patient will be moved to psych ED, the psych hold orders have been placed, CIWA protocol has been initiated. I find  the patient medically clear to be moved to psych ED begin detox.              [name], PA-C 05/26/12 2002

## 2012-05-26 NOTE — ED Notes (Signed)
Pt very upset that she is at the hospital. Pt denies intoxication.Pt stated that she told the staff that "she wanted to kill herself this morning" Pt is alert, wanting to call her husband

## 2012-05-27 ENCOUNTER — Encounter (HOSPITAL_COMMUNITY): Payer: Self-pay

## 2012-05-27 DIAGNOSIS — F10239 Alcohol dependence with withdrawal, unspecified: Principal | ICD-10-CM

## 2012-05-27 DIAGNOSIS — F1020 Alcohol dependence, uncomplicated: Secondary | ICD-10-CM

## 2012-05-27 DIAGNOSIS — F10939 Alcohol use, unspecified with withdrawal, unspecified: Principal | ICD-10-CM | POA: Diagnosis present

## 2012-05-27 MED ORDER — CLONIDINE HCL 0.1 MG PO TABS: 0.1000 mg | ORAL_TABLET | Freq: Two times a day (BID) | ORAL | Status: DC

## 2012-05-27 MED ORDER — CHLORDIAZEPOXIDE HCL 25 MG PO CAPS
25.0000 mg | ORAL_CAPSULE | Freq: Four times a day (QID) | ORAL | Status: DC
  Administered 2012-05-27 (×2): 25 mg via ORAL
  Filled 2012-05-27 (×2): qty 1

## 2012-05-27 MED ORDER — CHLORDIAZEPOXIDE HCL 25 MG PO CAPS
25.0000 mg | ORAL_CAPSULE | Freq: Four times a day (QID) | ORAL | Status: DC | PRN
  Administered 2012-05-27: 25 mg via ORAL
  Filled 2012-05-27: qty 1

## 2012-05-27 MED ORDER — TRAZODONE HCL 50 MG PO TABS
50.0000 mg | ORAL_TABLET | Freq: Every day | ORAL | Status: DC
  Filled 2012-05-27 (×3): qty 1

## 2012-05-27 MED ORDER — CLONIDINE HCL 0.1 MG PO TABS
0.1000 mg | ORAL_TABLET | Freq: Two times a day (BID) | ORAL | Status: DC
  Administered 2012-05-27: 0.1 mg via ORAL
  Filled 2012-05-27 (×4): qty 1

## 2012-05-27 MED ORDER — ONDANSETRON 4 MG PO TBDP: 4.0000 mg | ORAL_TABLET | Freq: Four times a day (QID) | ORAL | Status: DC | PRN

## 2012-05-27 MED ORDER — ALBUTEROL SULFATE HFA 108 (90 BASE) MCG/ACT IN AERS: 2.0000 | INHALATION_SPRAY | Freq: Four times a day (QID) | RESPIRATORY_TRACT | Status: DC | PRN

## 2012-05-27 MED ORDER — NICOTINE 14 MG/24HR TD PT24
14.0000 mg | MEDICATED_PATCH | Freq: Every day | TRANSDERMAL | Status: DC
  Administered 2012-05-27: 14 mg via TRANSDERMAL
  Filled 2012-05-27 (×4): qty 1

## 2012-05-27 MED ORDER — ADULT MULTIVITAMIN W/MINERALS CH
1.0000 | ORAL_TABLET | Freq: Every day | ORAL | Status: DC
  Administered 2012-05-27: 1 via ORAL
  Filled 2012-05-27 (×3): qty 1

## 2012-05-27 MED ORDER — ALUM & MAG HYDROXIDE-SIMETH 200-200-20 MG/5ML PO SUSP: 30.0000 mL | ORAL | Status: DC | PRN

## 2012-05-27 MED ORDER — CHLORDIAZEPOXIDE HCL 25 MG PO CAPS: 25.0000 mg | ORAL_CAPSULE | Freq: Every day | ORAL | Status: DC

## 2012-05-27 MED ORDER — CHLORDIAZEPOXIDE HCL 25 MG PO CAPS: 25.0000 mg | ORAL_CAPSULE | Freq: Three times a day (TID) | ORAL | Status: DC

## 2012-05-27 MED ORDER — HYDROXYZINE HCL 25 MG PO TABS
25.0000 mg | ORAL_TABLET | Freq: Four times a day (QID) | ORAL | Status: DC | PRN
  Administered 2012-05-27: 25 mg via ORAL

## 2012-05-27 MED ORDER — LOPERAMIDE HCL 2 MG PO CAPS: 2.0000 mg | ORAL_CAPSULE | ORAL | Status: DC | PRN

## 2012-05-27 MED ORDER — MAGNESIUM HYDROXIDE 400 MG/5ML PO SUSP: 30.0000 mL | Freq: Every day | ORAL | Status: DC | PRN

## 2012-05-27 MED ORDER — TRAZODONE HCL 50 MG PO TABS: 50.0000 mg | ORAL_TABLET | Freq: Every evening | ORAL | Status: DC | PRN

## 2012-05-27 MED ORDER — ACETAMINOPHEN 325 MG PO TABS: 650.0000 mg | ORAL_TABLET | Freq: Four times a day (QID) | ORAL | Status: DC | PRN

## 2012-05-27 MED ORDER — VITAMIN B-1 100 MG PO TABS
100.0000 mg | ORAL_TABLET | Freq: Every day | ORAL | Status: DC
  Filled 2012-05-27 (×2): qty 1

## 2012-05-27 MED ORDER — CHLORDIAZEPOXIDE HCL 25 MG PO CAPS: 25.0000 mg | ORAL_CAPSULE | ORAL | Status: DC

## 2012-05-27 MED ORDER — THIAMINE HCL 100 MG/ML IJ SOLN: 100.0000 mg | Freq: Once | INTRAMUSCULAR | Status: DC

## 2012-05-27 NOTE — Discharge Summary (Signed)
Physician Discharge Summary Note  Patient:  Kaylee Ochoa is an 47 y.o., female MRN:  [id number] DOB:  Jun 04, 1965 Patient phone:  [phone] (home)  Patient address:   [address] [place] [address],   Date of Admission:  05/26/2012  Date of Discharge: 05/27/12  Reason for Admission: Alcohol intoxication  Discharge Diagnoses: Active Problems:   Alcohol dependence   Alcohol withdrawal  Review of Systems  Constitutional: Negative.   HENT: Negative.   Eyes: Negative.   Respiratory: Positive for hemoptysis.   Cardiovascular: Negative.   Gastrointestinal: Negative.   Genitourinary: Negative.   Musculoskeletal: Negative.   Skin: Negative.   Endo/Heme/Allergies: Negative.   Psychiatric/Behavioral: Positive for substance abuse. Negative for depression, suicidal ideas, hallucinations and memory loss. The patient is not nervous/anxious and does not have insomnia.    Axis Diagnosis:   AXIS I:  Alcohol Abuse, alcohol dependence AXIS II:  Deferred AXIS III:   Past Medical History  Diagnosis Date  . Hypertension   . Renal disorder   . Depression    AXIS IV:  Alcoholism AXIS V:  63  Level of Care:  RTC  Hospital Course:  Drinking on and off for the last 20 years. When she was with her kids for two years and the drinking escalated. She got a job, husband "kicked her out." He gave her a year to get sober. The first few months she was living by herself, so she got to drink more. 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 They stay with her during weekends. She was told that they could smell it. The work is being supportive. Drinking one a half to two bottles of white wine.  Ms. Bourquin stay in this hospital was rather very brief. She arrived and was discharged on this same day. Ms. dahan [facility] for [facility]. While a patient at the [facility],  her blood pressure was noted to be outrageously high. She was then referred to [facility] to have her high blood pressure readings stabilized. During admission assessment and evaluation, Ms. Dea informed us that she was taken some blood pressure medicine she decribed as a tiny pill, but unable to remember the name. She was not quite sure when she took the medicine last. Her pharmacy was called to verify this information. We verified that the name/dose of the medicine was Clonidine 0.1mg  bid. The last time this medication was filled by the patient was last June, 2013.  Ms. Bucknam was then restarted on her medication for high blood pressure. She was also started on Librium protocol for alcohol detoxification. Once her medication was restarted and this was made aware to the [name], patient was called to return to [name] to continue her treatment for alcoholism.  Upon discharge, patient adamantly denies suicidal, homicidal ideations, auditory, visual hallucinations, delusional thinking and or paranoia. She left [facility] with all personal belongings in no apparent distress. She received 14 days worth supply samples of her discharge medication. Transportation per Statistician.  Consults:  None  Significant Diagnostic Studies:  labs: CBC with diff, CMP, UDS, Toxicology tests.  Discharge Vitals:   Blood pressure 162/106, pulse 104, temperature 98.6 F (37 C), temperature source Oral, resp. rate 16, height 5' 5.5" (1.664 m), weight 53.524 kg (118 lb), last menstrual period 05/12/2012. Body mass index is 19.33 kg/(m^2). Lab Results:   Results for orders  placed during the hospital encounter of 05/26/12 (from the past 72 hour(s))  URINE RAPID DRUG SCREEN (HOSP PERFORMED)     Status: None   Collection Time    05/26/12 11:22 PM      Result Value Range   Opiates NONE DETECTED  NONE DETECTED   Cocaine NONE DETECTED  NONE DETECTED   Benzodiazepines NONE DETECTED  NONE DETECTED   Amphetamines  NONE DETECTED  NONE DETECTED   Tetrahydrocannabinol NONE DETECTED  NONE DETECTED   Barbiturates NONE DETECTED  NONE DETECTED   Comment:            DRUG SCREEN FOR MEDICAL PURPOSES     ONLY.  IF CONFIRMATION IS NEEDED     FOR ANY PURPOSE, NOTIFY LAB     WITHIN 5 DAYS.                LOWEST DETECTABLE LIMITS     FOR URINE DRUG SCREEN     Drug Class       Cutoff (ng/mL)     Amphetamine      1000     Barbiturate      200     Benzodiazepine   200     Tricyclics       300     Opiates          300     Cocaine          300     THC              50    Physical Findings: AIMS: Facial and Oral Movements Muscles of Facial Expression: None, normal Lips and Perioral Area: None, normal Jaw: None, normal Tongue: None, normal,Extremity Movements Upper (arms, wrists, hands, fingers): None, normal Lower (legs, knees, ankles, toes): None, normal, Trunk Movements Neck, shoulders, hips: None, normal, Overall Severity Severity of abnormal movements (highest score from questions above): None, normal Incapacitation due to abnormal movements: None, normal Patient's awareness of abnormal movements (rate only patient's report): No Awareness, Dental Status Current problems with teeth and/or dentures?: No Does patient usually wear dentures?: No  CIWA:  CIWA-Ar Total: 6 COWS:  COWS Total Score: 3  Psychiatric Specialty Exam: See Psychiatric Specialty Exam and Suicide Risk Assessment completed by Attending Physician prior to discharge.  Discharge destination:  [facility]  Is patient on multiple antipsychotic therapies at discharge:  No   Has Patient had three or more failed trials of antipsychotic monotherapy by history:  No  Recommended Plan for Multiple Antipsychotic Therapies: NA      Discharge Orders   Future Orders Complete By Expires     Diet - low sodium heart healthy  As directed     Increase activity slowly  As directed         Medication List    TAKE these  medications     Indication   albuterol 108 (90 BASE) MCG/ACT inhaler  Commonly known as:  PROVENTIL HFA;VENTOLIN HFA  Inhale 2 puffs into the lungs every 6 (six) hours as needed for wheezing or shortness of breath. For shortness of breath   Indication:  Asthma, Chronic Obstructive Lung Disease, Reversible Obstructive Lung Disease     cloNIDine 0.1 MG tablet  Commonly known as:  CATAPRES  Take 1 tablet (0.1 mg total) by mouth 2 (two) times daily. For high blood pressure control   Indication:  High Blood Pressure       Follow-up Information   Follow  up with [name] On 05/28/2012. (Admit to [name] today; volunter should be here by noon)    Contact information:   [address] [place]  [place], [place] [address]  [phone] FAX [phone]        Follow-up recommendations:  Activity:  as tolerated Other:  Keep all scheduled follow-up appointments as recommended.   Comments: Take all your medications as prescribed by your mental healthcare provider. Report any adverse effects and or reactions from your medicines to your outpatient provider promptly. Patient is instructed and cautioned to not engage in alcohol and or illegal drug use while on prescription medicines. In the event of worsening symptoms, patient is instructed to call the crisis hotline, 911 and or go to the nearest ED for appropriate evaluation and treatment of symptoms. Follow-up with your primary care provider for your other medical issues, concerns and or health care needs.   Total Discharge Time:  Greater than 30 minutes.  SignedArmandina Stammer I 05/28/2012, 3:15 PM

## 2012-05-27 NOTE — Tx Team (Addendum)
Interdisciplinary Treatment Plan Update (Adult)  Date: 05/27/2012  Time Reviewed: 10:45 AM   Progress in Treatment: Attending groups: yes Participating in groups: yes Taking medication as prescribed:  Yes Tolerating medication:  Yes Family/Significant othe contact made: Not as yet Patient understands diagnosis: Yes Discussing patient identified problems/goals with staff: Yes Medical problems stabilized or resolved:  Yes Denies suicidal/homicidal ideation: Yes Issues/concerns per patient self-inventory:  NA Other: N/A  New problem(s) identified: Elevated blood pressure readings; patient reports that she has been non compliant with prescribed medication to treat blood pressure  Reason for Continuation of Hospitalization: Medical Issues Withdrawal symptoms  Interventions implemented related to continuation of hospitalization: mood stabilization, medication monitoring and adjustment, group therapy and psycho education, suicide risk assessment, collateral contact, aftercare planning, ongoing physician assessments and safety checks q 15 mins  Additional comments: N/A  Estimated length of stay: 1-2 days  Discharge Plan: Patient to go to [name] today 2/12 or tomorrow once vitals are stable  New goal(s): N/A  Review of initial/current patient goals per problem list:     1.  Goal: Patient will be able to identify effective and ineffective coping patterns  Met: No  Target Date: 1-2 days  As evidenced by: Patient participation in groups and further treatment at [facility] where she will discharge to today or tomorrow 2. Goal (s): Reduce depressive symptoms  Met: No  Target date: 1-2  days when patient discharges to  As Evidenced by Patient participation in groups and further treatment at [facility] where she will discharge to today or tomorrow 3 Complete Detox Protocol and identify comprehensive mental wellness and sobriety plan  Met: No & Yes Target date: 1-2  days As evidenced JX:[id number] report and discharge to [name]    Attendees: Patient:     Family:     Physician:  Geoffery Lyons 05/27/2012 10:45 AM   Nursing:   Robbie Louis, RN 05/27/2012 10:45 AM   Clinical Social Worker Ronda Fairly 05/27/2012 10:45 AM   Other:  Chinita Greenland, RN 05/27/2012 10:45 AM   Other:  Olivia Mackie, Psych Intern 05/27/2012 10:45 AM   Other:   05/27/2012 10:45 AM   Other:   05/27/2012 10:45 AM    Scribe for Treatment Team:   Carney Bern, LCSWA  05/27/2012 10:45 AM

## 2012-05-27 NOTE — H&P (Signed)
Psychiatric Admission Assessment Adult  Patient Identification:  Kaylee Ochoa Date of Evaluation:  05/27/2012 Chief Complaint:  Alcohol Dependence History of Present Illness:: Drinking on and off for the last 20 years. When she was with her kids for two years and the drinking escalated. She got a job, husband "kicked her out." He gave her a year to get sober. The first few months she was living by herself, so she got to drink more. 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 They stay with her during weekends. She was told that they could smell it. The work is being supportive. Drinking one a half to two bottles of white wine Elements:  Location:  In patient. Quality:  Incresed use of alcohol. Severity:  moderate. Timing:  every day. Duration:  last several years. Associated Signs/Synptoms: Depression Symptoms:  depressed mood, anhedonia, fatigue, feelings of worthlessness/guilt, anxiety, loss of energy/fatigue, weight loss, increased appetite, (Hypo) Manic Symptoms:  Denies Anxiety Symptoms:  Excessive Worry, Psychotic Symptoms:  Denies PTSD Symptoms: NA  Psychiatric Specialty Exam: Physical Exam  ROS  Blood pressure 133/90, pulse 98, temperature 98.6 F (37 C), temperature source Oral, resp. rate 16, height 5' 5.5" (1.664 m), weight 53.524 kg (118 lb), last menstrual period 05/12/2012.Body mass index is 19.33 kg/(m^2).  General Appearance: Fairly Groomed  Patent attorney::  Fair  Speech:  Clear and Coherent  Volume:  Decreased  Mood:  worried  Affect:  worried  Thought Process:  Coherent and Goal Directed  Orientation:  Full (Time, Place, and Person)  Thought Content:  worries, concerns  Suicidal Thoughts:  No  Homicidal Thoughts:  No  Memory:  Immediate;   Fair Recent;   Fair Remote;   Fair  Judgement:  Fair  Insight:  Present  Psychomotor Activity:  Decreased  Concentration:  Fair  Recall:  Fair  Akathisia:  No   Handed:  Right  AIMS (if indicated):     Assets:  Desire for Improvement Housing Social Support Transportation  Sleep:  Number of Hours: 4.25    Past Psychiatric History: Diagnosis: Alcohol Dependence  Hospitalizations: [facility] May 2013. Detox  Outpatient Care: AA for the 29 days she abstained/ [facility]  Substance Abuse Care: AA  Self-Mutilation: Denies  Suicidal Attempts:Denies  Violent Behaviors:Denies   Past Medical History:   Past Medical History  Diagnosis Date  . Hypertension   . Renal disorder   . Depression     Allergies:   Allergies  Allergen Reactions  . Other Hives, Itching and Rash    UNKNOWN ANTIBIOTIC: Patient cannot recall name of the medication but it casued a rash all over the body, hives, and itching  . Sulfa Antibiotics Hives   PTA Medications: Prescriptions prior to admission  Medication Sig Dispense Refill  . [DISCONTINUED] albuterol (PROVENTIL HFA;VENTOLIN HFA) 108 (90 BASE) MCG/ACT inhaler Inhale 2 puffs into the lungs every 6 (six) hours as needed for wheezing.        Previous Psychotropic Medications:  Medication/Dose  Denies               Substance Abuse History in the last 12 months:  yes  Consequences of Substance Abuse: Legal Consequences:  2 DWI Family Consequences:  separated from husband  Social History:  reports that she has been smoking Cigarettes.  She has a 2.5 pack-year smoking history. She does not have any smokeless tobacco history on file. She reports that  drinks alcohol.  She reports that she does not use illicit drugs. Additional Social History: Pain Medications: None  Prescriptions: None  Over the Counter: none  History of alcohol / drug use?: Yes Longest period of sobriety (when/how long): 27 Days  Negative Consequences of Use: Financial;Legal;Personal relationships Withdrawal Symptoms: Tremors Name of Substance 1: Alcohol--Wine  1 - Age of First Use: 57 YOF  1 - Amount (size/oz): 2  Bottles 1 - Frequency: Daily  1 - Duration: On-going  1 - Last Use / Amount: 05/26/12                  Current Place of Residence:   Place of Birth:   Family Members: Marital Status:  Separated Children: 4 adopted kids from foster care  Sons: 3 twins  Daughters: 9, 7 Relationships: Education:  Corporate treasurer Problems/Performance: Religious Beliefs/Practices: History of Abuse (Emotional/Phsycial/Sexual) Occupational Experiences; Restaurant work Hotel manager History:  None. Legal History: Hobbies/Interests:  Family History:  History reviewed. No pertinent family history., Alcohol, Depression  Results for orders placed during the hospital encounter of 05/26/12 (from the past 72 hour(s))  URINE RAPID DRUG SCREEN (HOSP PERFORMED)     Status: None   Collection Time    05/26/12 11:22 PM      Result Value Range   Opiates NONE DETECTED  NONE DETECTED   Cocaine NONE DETECTED  NONE DETECTED   Benzodiazepines NONE DETECTED  NONE DETECTED   Amphetamines NONE DETECTED  NONE DETECTED   Tetrahydrocannabinol NONE DETECTED  NONE DETECTED   Barbiturates NONE DETECTED  NONE DETECTED   Comment:            DRUG SCREEN FOR MEDICAL PURPOSES     ONLY.  IF CONFIRMATION IS NEEDED     FOR ANY PURPOSE, NOTIFY LAB     WITHIN 5 DAYS.                LOWEST DETECTABLE LIMITS     FOR URINE DRUG SCREEN     Drug Class       Cutoff (ng/mL)     Amphetamine      1000     Barbiturate      200     Benzodiazepine   200     Tricyclics       300     Opiates          300     Cocaine          300     THC              50   Psychological Evaluations:  Assessment:   AXIS I:  Alcohol Dependence, withdrawal, Substance Induced mood disorder Vs. Adjustment disorder with depression  AXIS II:  Deferred AXIS III:   Past Medical History  Diagnosis Date  . Hypertension   . Renal disorder   . Depression    AXIS IV:  problems with primary support group AXIS V:  61-70 mild symptoms  Treatment  Plan/Recommendations:  Supportive approach / coping skills /relapse prevention                                                                   Treatment Plan Summary: Daily contact with patient to assess and evaluate symptoms  and progress in treatment Medication management Current Medications:  Current Facility-Administered Medications  Medication Dose Route Frequency Provider Last Rate Last Dose  . acetaminophen (TYLENOL) tablet 650 mg  650 mg Oral Q6H PRN Kerry Hough, PA      . albuterol (PROVENTIL HFA;VENTOLIN HFA) 108 (90 BASE) MCG/ACT inhaler 2 puff  2 puff Inhalation Q6H PRN Kerry Hough, PA      . alum & mag hydroxide-simeth (MAALOX/MYLANTA) 200-200-20 MG/5ML suspension 30 mL  30 mL Oral Q4H PRN Kerry Hough, PA      . chlordiazePOXIDE (LIBRIUM) capsule 25 mg  25 mg Oral Q6H PRN Kerry Hough, PA      . chlordiazePOXIDE (LIBRIUM) capsule 25 mg  25 mg Oral QID Kerry Hough, PA   25 mg at 05/27/12 1478   Followed by  . [START ON 05/28/2012] chlordiazePOXIDE (LIBRIUM) capsule 25 mg  25 mg Oral TID Kerry Hough, PA       Followed by  . [START ON 05/29/2012] chlordiazePOXIDE (LIBRIUM) capsule 25 mg  25 mg Oral BH-qamhs Kerry Hough, PA       Followed by  . [START ON 05/31/2012] chlordiazePOXIDE (LIBRIUM) capsule 25 mg  25 mg Oral Daily Kerry Hough, PA      . hydrOXYzine (ATARAX/VISTARIL) tablet 25 mg  25 mg Oral Q6H PRN Kerry Hough, PA   25 mg at 05/27/12 2956  . loperamide (IMODIUM) capsule 2-4 mg  2-4 mg Oral PRN Kerry Hough, PA      . magnesium hydroxide (MILK OF MAGNESIA) suspension 30 mL  30 mL Oral Daily PRN Kerry Hough, PA      . multivitamin with minerals tablet 1 tablet  1 tablet Oral Daily Kerry Hough, PA   1 tablet at 05/27/12 [phone]  . nicotine (NICODERM CQ - dosed in mg/24 hours) patch 14 mg  14 mg Transdermal Q0600 Kerry Hough, PA   14 mg at 05/27/12 8657  . ondansetron (ZOFRAN-ODT) disintegrating tablet 4 mg  4 mg Oral Q6H PRN  Kerry Hough, PA      . thiamine (B-1) injection 100 mg  100 mg Intramuscular Once Kerry Hough, [place]      . Melene Muller ON 05/28/2012] thiamine (VITAMIN B-1) tablet 100 mg  100 mg Oral Daily Kerry Hough, PA      . traZODone (DESYREL) tablet 50 mg  50 mg Oral QHS Kerry Hough, PA        Observation Level/Precautions:  15 minute checks  Laboratory:  As per the ED  Psychotherapy:  Individual/group/relapse prevention  Medications:  Assess for librium detox  Consultations:    Discharge Concerns:    Estimated LOS:  Other:  To be admitted to [name]   I certify that inpatient services furnished can reasonably be expected to improve the patient's condition.   Martisha Toulouse A 2/12/20149:41 AM

## 2012-05-27 NOTE — BHH Counselor (Signed)
Adult Comprehensive Assessment  Patient ID: Kaylee Ochoa, female   DOB: 1965/07/24, 47 y.o.   MRN: [id number]  Information Source: Information source: Patient  Current Stressors:  Educational / Learning stressors: N/A Employment / Job issues: None Family Relationships: Recent split with husband Surveyor, quantity / Lack of resources (include bankruptcy): Not until the last few months after the separation from her husband. Harder to pay bills Housing / Lack of housing: None Physical health (include injuries & life threatening diseases): None Social relationships: None Substance abuse: Alcohol use Bereavement / Loss: None  Living/Environment/Situation:  Living Arrangements: Alone Living conditions (as described by patient or guardian): Lives in [place] in a rented 3 bedroom house How long has patient lived in current situation?: 5 months What is atmosphere in current home: Comfortable  Family History:  Marital status: Separated Separated, when?: September 2013 What types of issues is patient dealing with in the relationship?: Her alcohol abuse Does patient have children?: Yes How many children?: 4 How is patient's relationship with their children?: Wonderful. Sees them daily and stay with her on the weekends  Childhood History:  By whom was/is the patient raised?: Mother Description of patient's relationship with caregiver when they were a child: Very good Patient's description of current relationship with people who raised him/her: Good, older so cannot get around too much but still a good relationship Does patient have siblings?: Yes Number of Siblings: 3 Description of patient's current relationship with siblings: Older sister in [place] but get along well.  Also gets along with younger sister.  Brother and her currently having problems. 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 Currently a Consulting civil engineer?: No Learning disability?: No  Employment/Work Situation:   Employment situation: Employed 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 A bottle and a half a day. If attempted suicide, did drugs/alcohol play a role in this?: No Alcohol/Substance Abuse Treatment Hx: Past detox If yes, describe treatment: [facility], for five days Has alcohol/substance abuse ever caused legal problems?: Yes  Social Support System:   Patient's Community Support System: Fair Describe Community Support System: Her husband until last week, why she is here. 3 or 4 good friends at work Type of faith/religion: Ephriam Knuckles How does patient's faith help to cope with current illness?: Talk to God  Leisure/Recreation:   Leisure and Hobbies: Kids, no time for other things  Strengths/Needs:   What things does the patient do well?: Job and mother. Makes friends easily  In what areas does patient  struggle / problems for patient: Not getting  aggrevated with the kids. Have more structure  Discharge Plan:   Does patient have access to transportation?: Yes Will patient be returning to same living situation after discharge?: No Plan for living situation after discharge: [name] Currently receiving community mental health services: Yes (From Whom) If no, would patient like referral for services when discharged?: No Does patient have financial barriers related to discharge medications?: No  Summary/Recommendations:   Summary and Recommendations (to be completed by the evaluator): Kaylee Ochoa is a 47 year old female, recently seperated from her Husband.  Diagnosed with alcohol dependance. She was a good reporter of her current struggle and easy to follow when providing answers.   Kaylee Ochoa. 05/27/2012

## 2012-05-27 NOTE — Progress Notes (Signed)
Psychoeducational Group Note  Date:  05/27/2012 Time:  1100  Group Topic/Focus:  Personal Choices and Values:   The focus of this group is to help patients assess and explore the importance of values in their lives, how their values affect their decisions, how they express their values and what opposes their expression.  Participation Level: Did Not Attend  Participation Quality:  Not Applicable  Affect:  Not Applicable  Cognitive:  Not Applicable  Insight:  Not Applicable  Engagement in Group: Not Applicable  Additional Comments:  Pt did not attend this group.  Sharyn Lull 05/27/2012, 2:10 PM

## 2012-05-27 NOTE — Progress Notes (Signed)
Nutrition Brief Note  Patient identified on the Malnutrition Screening Tool (MST) Report  Body mass index is 19.33 kg/(m^2). Patient weight low normal based on current BMI.   Patient reports weight loss from 135 lbs since August.  Causes included separation from her husband, increased responsibility, increased ETOH use.  States that she gets the kids after school.  They get home about 5:00.  She prepares them dinner, cleans up and gets them ready to be with their dad at 6:00.  (Children ages 48,7, 90 yo twins.)  Discussed the importance of improved nutrition and eating with her children.    Current diet order is regular, patient is consuming fair intake of meals at this time. Labs and medications reviewed.   If further nutrition issues arise, please consult RD.   Oran Rein, RD, LDN Clinical Inpatient Dietitian Pager:  [phone] Weekend and after hours pager:  [phone]

## 2012-05-27 NOTE — BHH Suicide Risk Assessment (Signed)
Suicide Risk Assessment  Admission Assessment     Nursing information obtained from:  Patient Demographic factors:  Low socioeconomic status;Living alone Current Mental Status:  NA Loss Factors:  Decline in physical health;Loss of significant relationship;Financial problems / change in socioeconomic status Historical Factors:  NA Risk Reduction Factors:  Employed;Positive social support;Sense of responsibility to family;Responsible for children under 51 years of age  CLINICAL FACTORS:   Alcohol/Substance Abuse/Dependencies  COGNITIVE FEATURES THAT CONTRIBUTE TO RISK: None identified   SUICIDE RISK:   Minimal: No identifiable suicidal ideation.  Patients presenting with no risk factors but with morbid ruminations; may be classified as minimal risk based on the severity of the depressive symptoms  PLAN OF CARE: Detox if necessary                              Refer back to  [name] for residential treatment  I certify that inpatient services furnished can reasonably be expected to improve the patient's condition.  Gailen Venne A 05/27/2012, 12:26 PM

## 2012-05-27 NOTE — Progress Notes (Signed)
Patient ID: Kaylee Ochoa, female   DOB: [birth year], 47 y.o.   MRN: [id number]  Admission Note:  47 yr female who presents [facility] in no acute distress for ETOH detox and Depression. Pt appears flat and depressed. Pt was calm and cooperative with admission process.  Pt denies SI/HI/ AVH. Pt CIWA score was 6 at 0015. PT states her husband will take custody of their 4 adopted kids (9,7, twins 70yr) if she does not get help for her drinking. Pt complains of increased anxiety and decreased sleep for the past few days. Pt has Past medical Hx of Asthma, Depression, renal disorder.Skin was assessed and found to be clear of any abnormal marks apart from a scar on L-nipple from cyst removal, small tattoo on R-lower abdomen . POC and unit policies explained and understanding verbalized. Consents obtained. Food and fluids offered and  accepted. Pt had no additional questions or concerns.

## 2012-05-27 NOTE — BHH Suicide Risk Assessment (Signed)
Suicide Risk Assessment  Discharge Assessment     Demographic Factors:  Caucasian  Mental Status Per Nursing Assessment::   On Admission:  NA  Current Mental Status by Physician: In full contact with reality. There are no suicidal ideas, plans or intent. She is motivated to pursue rehab at [facility] today   Loss Factors: NA  Historical Factors: NA  Risk Reduction Factors:   Responsible for children under 45 years of age, Sense of responsibility to family, Employed, Living with another person, especially a relative and Positive social support  Continued Clinical Symptoms:  Alcohol/Substance Abuse/Dependencies  Cognitive Features That Contribute To Risk: No evidence   Suicide Risk:  Minimal: No identifiable suicidal ideation.  Patients presenting with no risk factors but with morbid ruminations; may be classified as minimal risk based on the severity of the depressive symptoms  Discharge Diagnoses:   AXIS I:  Alcohol Dependence AXIS II:  Deferred AXIS III:   Past Medical History  Diagnosis Date  . Hypertension   . Renal disorder   . Depression    AXIS IV:  problems with primary support group AXIS V:  61-70 mild symptoms  Plan Of Care/Follow-up recommendations:  Activity:  As tolerated Diet:  regular [name] Is patient on multiple antipsychotic therapies at discharge:  No   Has Patient had three or more failed trials of antipsychotic monotherapy by history:  No  Recommended Plan for Multiple Antipsychotic Therapies: N/A   Tayven Renteria A 05/27/2012, 2:40 PM

## 2012-05-27 NOTE — Progress Notes (Signed)
Patient ID: Kaylee Ochoa, female   DOB: May 21, 1965, 47 y.o.   MRN: [id number]   CSW spoke with admissions at [facility] as vitals were needed before they could confirm pick up and admission.  Blood pressure at 9 am was high 180/120 with respirations at 120.  Before lunch her vitals were taken again and read 172/108 with pulse of 118 and 128.  Patient reports her blood pressure always runs high and she has been non compliant with medication.  RN will provide reading after lunch today and these will be reported to [facility]. Pending note at this time Clide Dales 12:22 PM   Addendum 05/28/2012 10:43 AM Vitals were printed and faxed to [name] at 1:15PM on 2.12.14.  [name] called at 2:30 PM and stated that they were sending maintenance truck and peer counselor to pick up patient as weather conditions were worsening and they wanted to get her in ASAP.  Patient was thrilled to be discharging to [facility]. ROI signed and with chart.   Carney Bern, LCSWA Clinical Social Worker [phone]

## 2012-05-27 NOTE — Progress Notes (Signed)
Pt was discharged to [facility] today.  She denied any S/I H/I or A/V hallucinations.    She was given f/u appointment, rx, hotline info booklet.She voiced understanding to all instructions provided.  She declined the need for smoking cessation materials.  She removed her nicotine patch before she left.

## 2012-05-27 NOTE — Tx Team (Signed)
Initial Interdisciplinary Treatment Plan  PATIENT STRENGTHS: (choose at least two) Ability for insight General fund of knowledge  PATIENT STRESSORS: Financial difficulties Marital or family conflict Substance abuse   PROBLEM LIST: Problem List/Patient Goals Date to be addressed Date deferred Reason deferred Estimated date of resolution  Detox from ETOH                                                       DISCHARGE CRITERIA:  Ability to meet basic life and health needs Improved stabilization in mood, thinking, and/or behavior Withdrawal symptoms are absent or subacute and managed without 24-hour nursing intervention  PRELIMINARY DISCHARGE PLAN: Attend aftercare/continuing care group Attend 12-step recovery group Outpatient therapy  PATIENT/FAMIILY INVOLVEMENT: This treatment plan has been presented to and reviewed with the patient, Kaylee Ochoa.  The patient and family have been given the opportunity to ask questions and make suggestions.  Jacques Navy A 05/27/2012, 12:59 AM

## 2012-05-27 NOTE — Progress Notes (Signed)
[facility] LCSW Aftercare Discharge Planning Group Note  05/27/2012 8:45 AM  Participation Quality:  Appropriate  Affect:  Appropriate  Cognitive:  Alert and Oriented  Insight:  Limited  Engagement in Group:  Engaged  Modes of Intervention:  Clarification, Exploration, Rapport Building and Support  Summary of Progress/Problems: Patient reports she will be picked up at noon by volunteer from [facility] and needs her vitals faxed to Admissions at [facility] facility.  Patient reports her BAC was over 300 when she arrived at [facility] yesterday and they sent her here for medical reasons last evening.  CSW will make contact.   Clide Dales 05/27/2012,

## 2012-05-28 NOTE — Progress Notes (Signed)
[facility] Adult Case Management Discharge Plan :  Will you be returning to the same living situation after discharge: No. Patient discharged to [facility] At discharge, do you have transportation home?:Yes,  staff from [name] Do you have the ability to pay for your medications:Yes,  insurance  Release of information consent forms completed and in the chart;  Patient's signature needed at discharge.  Patient to Follow up at: Follow-up Information   Follow up with [name] On 05/28/2012. (Admit to [name] today; volunter should be here by noon)    Contact information:   [address]  [place], [place] [address]  [phone] FAX [phone]        Patient denies SI/HI:   Yes,  denies both    Safety Planning and Suicide Prevention discussed:  Yes,  with patient  Clide Dales 05/28/2012, 10:44 AM

## 2012-06-01 NOTE — Progress Notes (Signed)
Patient Discharge Instructions:  After Visit Summary (AVS):   Faxed to:  06/01/12 Discharge Summary Note:   Faxed to:  06/01/12 Psychiatric Admission Assessment Note:   Faxed to:  06/01/12 Suicide Risk Assessment - Discharge Assessment:   Faxed to:  06/01/12 Faxed/Sent to the Next Level Care provider:  06/01/12 Faxed to [facility] @ [phone]  Jerelene Redden, 06/01/2012, 4:16 PM

## 2012-06-12 NOTE — Discharge Summary (Signed)
Agree with assessment and plan Khanh Tanori A. Azlee Monforte, M.D. 

## 2012-11-03 ENCOUNTER — Encounter (HOSPITAL_COMMUNITY): Payer: Self-pay

## 2012-11-03 ENCOUNTER — Emergency Department (HOSPITAL_COMMUNITY)
Admission: EM | Admit: 2012-11-03 | Discharge: 2012-11-04 | Disposition: A | Discharge status: Home / Self Care | Payer: BC Managed Care – PPO | Attending: Emergency Medicine | Admitting: Emergency Medicine

## 2012-11-03 DIAGNOSIS — F329 Major depressive disorder, single episode, unspecified: Secondary | ICD-10-CM | POA: Insufficient documentation

## 2012-11-03 DIAGNOSIS — F10920 Alcohol use, unspecified with intoxication, uncomplicated: Secondary | ICD-10-CM

## 2012-11-03 DIAGNOSIS — F3289 Other specified depressive episodes: Secondary | ICD-10-CM | POA: Insufficient documentation

## 2012-11-03 DIAGNOSIS — I10 Essential (primary) hypertension: Secondary | ICD-10-CM | POA: Insufficient documentation

## 2012-11-03 DIAGNOSIS — Z87448 Personal history of other diseases of urinary system: Secondary | ICD-10-CM | POA: Insufficient documentation

## 2012-11-03 DIAGNOSIS — F1020 Alcohol dependence, uncomplicated: Secondary | ICD-10-CM | POA: Insufficient documentation

## 2012-11-03 DIAGNOSIS — Z3202 Encounter for pregnancy test, result negative: Secondary | ICD-10-CM | POA: Insufficient documentation

## 2012-11-03 DIAGNOSIS — Z79899 Other long term (current) drug therapy: Secondary | ICD-10-CM | POA: Insufficient documentation

## 2012-11-03 DIAGNOSIS — F17200 Nicotine dependence, unspecified, uncomplicated: Secondary | ICD-10-CM | POA: Insufficient documentation

## 2012-11-03 HISTORY — DX: Alcohol abuse, uncomplicated: F10.10

## 2012-11-03 LAB — CBC WITH DIFFERENTIAL/PLATELET
Basophils Absolute: 0 10*3/uL (ref 0.0–0.1)
Eosinophils Relative: 1 % (ref 0–5)
Lymphocytes Relative: 37 % (ref 12–46)
Neutro Abs: 5.9 10*3/uL (ref 1.7–7.7)
Platelets: 216 10*3/uL (ref 150–400)
RDW: 13.2 % (ref 11.5–15.5)
WBC: 10.5 10*3/uL (ref 4.0–10.5)

## 2012-11-03 LAB — ETHANOL: Alcohol, Ethyl (B): 366 mg/dL — ABNORMAL HIGH (ref 0–11)

## 2012-11-03 LAB — BASIC METABOLIC PANEL
CO2: 25 mEq/L (ref 19–32)
Calcium: 9.2 mg/dL (ref 8.4–10.5)
Chloride: 96 mEq/L (ref 96–112)
Sodium: 137 mEq/L (ref 135–145)

## 2012-11-03 NOTE — ED Notes (Signed)
Pt here with friends who [facility] and after she has been medically cleared.  Pt has been drinking heavily for more than a week and has not been eating.  Pt denies SI at this time

## 2012-11-04 LAB — RAPID URINE DRUG SCREEN, HOSP PERFORMED
Amphetamines: NOT DETECTED
Opiates: NOT DETECTED

## 2012-11-04 LAB — URINALYSIS, ROUTINE W REFLEX MICROSCOPIC
Glucose, UA: NEGATIVE mg/dL
Nitrite: NEGATIVE

## 2012-11-04 LAB — URINE MICROSCOPIC-ADD ON

## 2012-11-04 MED ORDER — LORAZEPAM 1 MG PO TABS: 1.0000 mg | ORAL_TABLET | Freq: Four times a day (QID) | ORAL | Status: DC | PRN

## 2012-11-04 MED ORDER — VITAMIN B-1 100 MG PO TABS: 100.0000 mg | ORAL_TABLET | Freq: Every day | ORAL | Status: DC

## 2012-11-04 MED ORDER — LORAZEPAM 2 MG/ML IJ SOLN: 1.0000 mg | Freq: Four times a day (QID) | INTRAMUSCULAR | Status: DC | PRN

## 2012-11-04 MED ORDER — ADULT MULTIVITAMIN W/MINERALS CH: 1.0000 | ORAL_TABLET | Freq: Every day | ORAL | Status: DC

## 2012-11-04 MED ORDER — FOLIC ACID 1 MG PO TABS: 1.0000 mg | ORAL_TABLET | Freq: Every day | ORAL | Status: DC

## 2012-11-04 MED ORDER — FAMOTIDINE 20 MG PO TABS
20.0000 mg | ORAL_TABLET | Freq: Once | ORAL | Status: AC
  Administered 2012-11-04: 20 mg via ORAL
  Filled 2012-11-04: qty 1

## 2012-11-04 MED ORDER — CLONIDINE HCL 0.1 MG PO TABS
0.1000 mg | ORAL_TABLET | Freq: Once | ORAL | Status: AC
  Administered 2012-11-04: 0.1 mg via ORAL
  Filled 2012-11-04: qty 1

## 2012-11-04 MED ORDER — THIAMINE HCL 100 MG/ML IJ SOLN: 100.0000 mg | Freq: Every day | INTRAMUSCULAR | Status: DC

## 2012-11-04 MED ORDER — PANTOPRAZOLE SODIUM 40 MG PO TBEC
40.0000 mg | DELAYED_RELEASE_TABLET | Freq: Once | ORAL | Status: AC
  Administered 2012-11-04: 40 mg via ORAL
  Filled 2012-11-04: qty 1

## 2012-11-04 NOTE — ED Notes (Signed)
Awake, talking with friend. Trying to develop a plan for detox. Tearful at times

## 2012-11-04 NOTE — ED Provider Notes (Addendum)
History    CSN: [id number] Arrival date & time 11/03/12  2258  First MD Initiated Contact with Patient 11/03/12 2329     Chief Complaint  Patient presents with  . V70.1   (Consider location/radiation/quality/duration/timing/severity/associated sxs/prior Treatment) HPI HPI Comments: Kaylee Ochoa is a 47 y.o. female who presents to the Emergency Department complaining of alcohol abuse. She is here accompanied by a friend who [facility] They called the hall and were advised they needed to talk with the admission person after 8 am. She was brought to the ER for etoh detox.   PCP Arn Medal  Past Medical History  Diagnosis Date  . Hypertension   . Renal disorder   . Depression   . ETOH abuse    Past Surgical History  Procedure Laterality Date  . Breast surgery    . Cyst removal on breast     No family history on file. History  Substance Use Topics  . Smoking status: Current Every Day Smoker -- 0.25 packs/day for 10 years    Types: Cigarettes  . Smokeless tobacco: Not on file  . Alcohol Use: Yes     Comment: 2 bottles--daily    OB History   Grav Para Term Preterm Abortions TAB SAB Ect Mult Living                 Review of Systems  Constitutional: Negative for fever.       10 Systems reviewed and are negative for acute change except as noted in the HPI.  HENT: Negative for congestion.   Eyes: Negative for discharge and redness.  Respiratory: Negative for cough and shortness of breath.   Cardiovascular: Negative for chest pain.  Gastrointestinal: Negative for vomiting and abdominal pain.  Musculoskeletal: Negative for back pain.  Skin: Negative for rash.  Neurological: Negative for syncope, numbness and headaches.  Psychiatric/Behavioral:       No behavior change.    Allergies  Other and Sulfa antibiotics  Home Medications   Current Outpatient Rx  Name  Route  Sig  Dispense  Refill  . albuterol  (PROVENTIL HFA;VENTOLIN HFA) 108 (90 BASE) MCG/ACT inhaler   Inhalation   Inhale 2 puffs into the lungs every 6 (six) hours as needed for wheezing or shortness of breath. For shortness of breath         . cloNIDine (CATAPRES) 0.1 MG tablet   Oral   Take 1 tablet (0.1 mg total) by mouth 2 (two) times daily. For high blood pressure control   60 tablet   0    BP 157/107  Pulse 132  Temp(Src) 99 F (37.2 C) (Oral)  Resp 20  Wt 136 lb (61.689 kg)  BMI 22.28 kg/m2  SpO2 98%  LMP 10/13/2012 Physical Exam  Nursing note and vitals reviewed. Constitutional: She appears well-developed and well-nourished.  Awake, alert, nontoxic appearance.  HENT:  Head: Normocephalic and atraumatic.  Eyes: EOM are normal. Pupils are equal, round, and reactive to light.  Neck: Neck supple.  Cardiovascular: Normal rate and intact distal pulses.   Pulmonary/Chest: Effort normal. She exhibits no tenderness.  Abdominal: Soft. There is no tenderness. There is no rebound.  Musculoskeletal: She exhibits no tenderness.  Baseline ROM, no obvious new focal weakness.  Neurological:  Mental status and motor strength appears baseline for patient and situation.  Skin: No rash noted.  Psychiatric: She has a normal mood and affect.  ED Course  Procedures (including critical care time) Results for orders placed during the hospital encounter of 11/03/12  CBC WITH DIFFERENTIAL      Result Value Range   WBC 10.5  4.0 - 10.5 K/uL   RBC 4.69  3.87 - 5.11 MIL/uL   Hemoglobin 14.2  12.0 - 15.0 g/dL   HCT 14.7  82.9 - 56.2 %   MCV 88.9  78.0 - 100.0 fL   MCH 30.3  26.0 - 34.0 pg   MCHC 34.1  30.0 - 36.0 g/dL   RDW 13.0  86.5 - 78.4 %   Platelets 216  150 - 400 K/uL   Neutrophils Relative % 56  43 - 77 %   Neutro Abs 5.9  1.7 - 7.7 K/uL   Lymphocytes Relative 37  12 - 46 %   Lymphs Abs 3.9  0.7 - 4.0 K/uL   Monocytes Relative 5  3 - 12 %   Monocytes Absolute 0.6  0.1 - 1.0 K/uL   Eosinophils Relative 1  0 -  5 %   Eosinophils Absolute 0.1  0.0 - 0.7 K/uL   Basophils Relative 0  0 - 1 %   Basophils Absolute 0.0  0.0 - 0.1 K/uL  BASIC METABOLIC PANEL      Result Value Range   Sodium 137  135 - 145 mEq/L   Potassium 3.5  3.5 - 5.1 mEq/L   Chloride 96  96 - 112 mEq/L   CO2 25  19 - 32 mEq/L   Glucose, Bld 192 (*) 70 - 99 mg/dL   BUN 6  6 - 23 mg/dL   Creatinine, Ser 6.96  0.50 - 1.10 mg/dL   Calcium 9.2  8.4 - 29.5 mg/dL   GFR calc non Af Amer >90  >90 mL/min   GFR calc Af Amer >90  >90 mL/min  URINALYSIS, ROUTINE W REFLEX MICROSCOPIC      Result Value Range   Color, Urine YELLOW  YELLOW   APPearance CLOUDY (*) CLEAR   Specific Gravity, Urine 1.010  1.005 - 1.030   pH 6.0  5.0 - 8.0   Glucose, UA NEGATIVE  NEGATIVE mg/dL   Hgb urine dipstick MODERATE (*) NEGATIVE   Bilirubin Urine NEGATIVE  NEGATIVE   Ketones, ur NEGATIVE  NEGATIVE mg/dL   Protein, ur TRACE (*) NEGATIVE mg/dL   Urobilinogen, UA 0.2  0.0 - 1.0 mg/dL   Nitrite NEGATIVE  NEGATIVE   Leukocytes, UA TRACE (*) NEGATIVE  URINE RAPID DRUG SCREEN (HOSP PERFORMED)      Result Value Range   Opiates NONE DETECTED  NONE DETECTED   Cocaine NONE DETECTED  NONE DETECTED   Benzodiazepines NONE DETECTED  NONE DETECTED   Amphetamines NONE DETECTED  NONE DETECTED   Tetrahydrocannabinol NONE DETECTED  NONE DETECTED   Barbiturates NONE DETECTED  NONE DETECTED  PREGNANCY, URINE      Result Value Range   Preg Test, Ur NEGATIVE  NEGATIVE  ETHANOL      Result Value Range   Alcohol, Ethyl (B) 366 (*) 0 - 11 mg/dL  URINE MICROSCOPIC-ADD ON      Result Value Range   Squamous Epithelial / LPF MANY (*) RARE   WBC, UA 0-2  <3 WBC/hpf   RBC / HPF 0-2  <3 RBC/hpf   Bacteria, UA FEW (*) RARE    0200 Left messages on answering machine for ACT team.  0400 Received call back from [place] who advised she was sick and  unable to attend to the patient. [place] is on vacation. [facility] will need to be called in the AM for someone to see the patient.  MDM   Patient with excessive alcohol use. Medically cleared for evaluation for alcohol detox. ACT to see in the morning.   MDM Reviewed: nursing note and vitals Interpretation: labs     Nicoletta Dress. Colon Branch, MD 11/04/12 0102  Nicoletta Dress. Colon Branch, MD 11/04/12 4132  Nicoletta Dress. Colon Branch, MD 11/04/12 [phone]

## 2012-11-04 NOTE — ED Notes (Signed)
sleeping

## 2012-11-04 NOTE — ED Notes (Signed)
Pt states she wants to leave.  Her plan is to remain with her friend in her care, and follow up with [name] (still awaiting return call from [place] presently). Pt denies suicidal/homicidal ideation; pt is calm, cooperative. EDP notified.

## 2012-11-04 NOTE — ED Provider Notes (Signed)
Patient is not suicidal patient is not having significant alcohol withdrawal. Patient wants discharge. Patient is functional. Patient has a history of alcohol abuse and came in intoxicated. Patient supposedly has alcohol detox or range for today but she wants to go home stay she'll go there on her own.  Kaylee Jakes, MD 11/04/12 [phone]

## 2012-11-04 NOTE — ED Notes (Signed)
Patient assist to restroom, tolerated well. 

## 2012-11-04 NOTE — ED Notes (Signed)
Pt's sponsor from AA with her and will stay all night, has a calming effect on pt.

## 2012-11-04 NOTE — ED Notes (Signed)
Much calmer than upon admission. Laying quietly on stretcher watching TV and talking with friend. cooperative

## 2012-11-04 NOTE — ED Notes (Signed)
Sleeping soundly 

## 2012-11-04 NOTE — ED Notes (Signed)
Pt contacted [name] - per pt, they need records from this facility.  Contacted [name] to check bed availability and see what records they require. Left voicemail for [facility].  Awaiting return call.

## 2012-11-26 ENCOUNTER — Ambulatory Visit (HOSPITAL_COMMUNITY): Admission: RE | Admit: 2012-11-26 | Payer: BC Managed Care – PPO | Source: Home / Self Care | Admitting: Psychiatry

## 2013-05-19 ENCOUNTER — Encounter (HOSPITAL_COMMUNITY): Payer: Self-pay | Admitting: Emergency Medicine

## 2013-05-19 ENCOUNTER — Emergency Department (HOSPITAL_COMMUNITY)
Admission: EM | Admit: 2013-05-19 | Discharge: 2013-05-20 | Disposition: A | Discharge status: Other Acute Inpatient Hospital | Payer: Self-pay | Attending: Emergency Medicine | Admitting: Emergency Medicine

## 2013-05-19 DIAGNOSIS — Z79899 Other long term (current) drug therapy: Secondary | ICD-10-CM | POA: Insufficient documentation

## 2013-05-19 DIAGNOSIS — Z3202 Encounter for pregnancy test, result negative: Secondary | ICD-10-CM | POA: Insufficient documentation

## 2013-05-19 DIAGNOSIS — F1020 Alcohol dependence, uncomplicated: Secondary | ICD-10-CM | POA: Insufficient documentation

## 2013-05-19 DIAGNOSIS — R45851 Suicidal ideations: Secondary | ICD-10-CM | POA: Insufficient documentation

## 2013-05-19 DIAGNOSIS — F17200 Nicotine dependence, unspecified, uncomplicated: Secondary | ICD-10-CM | POA: Insufficient documentation

## 2013-05-19 DIAGNOSIS — I10 Essential (primary) hypertension: Secondary | ICD-10-CM | POA: Insufficient documentation

## 2013-05-19 DIAGNOSIS — F10929 Alcohol use, unspecified with intoxication, unspecified: Secondary | ICD-10-CM

## 2013-05-19 DIAGNOSIS — F411 Generalized anxiety disorder: Secondary | ICD-10-CM | POA: Insufficient documentation

## 2013-05-19 DIAGNOSIS — Z87448 Personal history of other diseases of urinary system: Secondary | ICD-10-CM | POA: Insufficient documentation

## 2013-05-19 LAB — CBC WITH DIFFERENTIAL/PLATELET
BASOS PCT: 1 % (ref 0–1)
Basophils Absolute: 0 10*3/uL (ref 0.0–0.1)
EOS ABS: 0.1 10*3/uL (ref 0.0–0.7)
Eosinophils Relative: 1 % (ref 0–5)
HCT: 45.1 % (ref 36.0–46.0)
HEMOGLOBIN: 15.4 g/dL — AB (ref 12.0–15.0)
LYMPHS ABS: 3 10*3/uL (ref 0.7–4.0)
Lymphocytes Relative: 38 % (ref 12–46)
MCH: 33.8 pg (ref 26.0–34.0)
MCHC: 34.1 g/dL (ref 30.0–36.0)
MCV: 98.9 fL (ref 78.0–100.0)
MONOS PCT: 7 % (ref 3–12)
Monocytes Absolute: 0.5 10*3/uL (ref 0.1–1.0)
NEUTROS ABS: 4.2 10*3/uL (ref 1.7–7.7)
NEUTROS PCT: 54 % (ref 43–77)
PLATELETS: 287 10*3/uL (ref 150–400)
RBC: 4.56 MIL/uL (ref 3.87–5.11)
RDW: 13.1 % (ref 11.5–15.5)
WBC: 7.9 10*3/uL (ref 4.0–10.5)

## 2013-05-19 LAB — URINE MICROSCOPIC-ADD ON

## 2013-05-19 LAB — URINALYSIS, ROUTINE W REFLEX MICROSCOPIC
BILIRUBIN URINE: NEGATIVE
GLUCOSE, UA: NEGATIVE mg/dL
Ketones, ur: NEGATIVE mg/dL
Leukocytes, UA: NEGATIVE
Nitrite: NEGATIVE
PH: 5 (ref 5.0–8.0)
Protein, ur: NEGATIVE mg/dL
SPECIFIC GRAVITY, URINE: 1.015 (ref 1.005–1.030)
Urobilinogen, UA: 0.2 mg/dL (ref 0.0–1.0)

## 2013-05-19 LAB — COMPREHENSIVE METABOLIC PANEL
ALBUMIN: 4.2 g/dL (ref 3.5–5.2)
ALK PHOS: 103 U/L (ref 39–117)
ALT: 42 U/L — AB (ref 0–35)
AST: 119 U/L — ABNORMAL HIGH (ref 0–37)
BILIRUBIN TOTAL: 0.3 mg/dL (ref 0.3–1.2)
BUN: 11 mg/dL (ref 6–23)
CHLORIDE: 101 meq/L (ref 96–112)
CO2: 23 mEq/L (ref 19–32)
Calcium: 8.9 mg/dL (ref 8.4–10.5)
Creatinine, Ser: 0.63 mg/dL (ref 0.50–1.10)
GFR calc Af Amer: >90 mL/min (ref >90)
GFR calc non Af Amer: >90 mL/min (ref >90)
Glucose, Bld: 89 mg/dL (ref 70–99)
POTASSIUM: 3.8 meq/L (ref 3.7–5.3)
SODIUM: 142 meq/L (ref 137–147)
TOTAL PROTEIN: 7.7 g/dL (ref 6.0–8.3)

## 2013-05-19 LAB — ETHANOL
Alcohol, Ethyl (B): 349 mg/dL — ABNORMAL HIGH (ref 0–11)
Alcohol, Ethyl (B): 161 mg/dL — ABNORMAL HIGH (ref 0–11)

## 2013-05-19 LAB — RAPID URINE DRUG SCREEN, HOSP PERFORMED
AMPHETAMINES: NOT DETECTED
BARBITURATES: NOT DETECTED
BENZODIAZEPINES: NOT DETECTED
COCAINE: NOT DETECTED
OPIATES: NOT DETECTED
TETRAHYDROCANNABINOL: NOT DETECTED

## 2013-05-19 LAB — PREGNANCY, URINE: Preg Test, Ur: NEGATIVE

## 2013-05-19 MED ORDER — LORAZEPAM 1 MG PO TABS: 0.0000 mg | ORAL_TABLET | Freq: Two times a day (BID) | ORAL | Status: DC

## 2013-05-19 MED ORDER — SODIUM CHLORIDE 0.9 % IV SOLN
Freq: Once | INTRAVENOUS | Status: AC
  Administered 2013-05-19: 19:00:00 via INTRAVENOUS

## 2013-05-19 MED ORDER — VITAMIN B-1 100 MG PO TABS
100.0000 mg | ORAL_TABLET | Freq: Every day | ORAL | Status: DC
  Administered 2013-05-19: 100 mg via ORAL
  Filled 2013-05-19: qty 1

## 2013-05-19 MED ORDER — LORAZEPAM 1 MG PO TABS
0.0000 mg | ORAL_TABLET | Freq: Four times a day (QID) | ORAL | Status: DC
  Administered 2013-05-19 – 2013-05-20 (×3): 1 mg via ORAL
  Filled 2013-05-19 (×3): qty 1

## 2013-05-19 MED ORDER — THIAMINE HCL 100 MG/ML IJ SOLN: 100.0000 mg | Freq: Every day | INTRAMUSCULAR | Status: DC

## 2013-05-19 NOTE — ED Notes (Signed)
Pt advises feeling better after eating. No needs voiced at this time.

## 2013-05-19 NOTE — ED Provider Notes (Signed)
CSN: [id number]     Arrival date & time 05/19/13  1645 History   First MD Initiated Contact with Patient 05/19/13 1715     Chief Complaint  Patient presents with  . V70.1   (Consider location/radiation/quality/duration/timing/severity/associated sxs/prior Treatment) HPI Patient presents with request for assistance with alcohol abuse. She has a long history of alcohol abuse, has been hospitalized for this before. Patient currently denies suicidal ideation, homicidal ideation. She does have a history of prior suicide attempts, however. She denies current physical pain. She states that she has been drinking increasing amounts, with her significant other, who is also in this facility, currently requesting similar assistance.  Past Medical History  Diagnosis Date  . Hypertension   . Renal disorder   . Depression   . ETOH abuse    Past Surgical History  Procedure Laterality Date  . Breast surgery    . Cyst removal on breast     No family history on file. History  Substance Use Topics  . Smoking status: Current Every Day Smoker -- 0.25 packs/day for 10 years    Types: Cigarettes  . Smokeless tobacco: Not on file  . Alcohol Use: Yes     Comment: heavily daily   OB History   Grav Para Term Preterm Abortions TAB SAB Ect Mult Living                 Review of Systems  Constitutional:       Per HPI, otherwise negative  HENT:       Per HPI, otherwise negative  Respiratory:       Per HPI, otherwise negative  Cardiovascular:       Per HPI, otherwise negative  Gastrointestinal: Negative for vomiting.  Endocrine:       Negative aside from HPI  Genitourinary:       Neg aside from HPI   Musculoskeletal:       Per HPI, otherwise negative  Skin: Negative.   Neurological: Negative for syncope.  Psychiatric/Behavioral: Positive for suicidal ideas. The patient is nervous/anxious.     Allergies  Other and Sulfa antibiotics  Home Medications   Current Outpatient Rx  Name   Route  Sig  Dispense  Refill  . albuterol (PROVENTIL HFA;VENTOLIN HFA) 108 (90 BASE) MCG/ACT inhaler   Inhalation   Inhale 2 puffs into the lungs every 6 (six) hours as needed for wheezing.         Marland Kitchen ibuprofen (ADVIL,MOTRIN) 200 MG tablet   Oral   Take 800 mg by mouth every 6 (six) hours as needed for pain.          BP 136/92  Pulse 107  Temp(Src) 98.1 F (36.7 C) (Oral)  Resp 18  SpO2 98%  LMP 05/09/2013 Physical Exam  Nursing note and vitals reviewed. Constitutional: She is oriented to person, place, and time. She appears well-developed and well-nourished. No distress.  Clinically intoxicated  HENT:  Head: Normocephalic and atraumatic.  Eyes: Conjunctivae and EOM are normal.  Cardiovascular: Normal rate and regular rhythm.   Pulmonary/Chest: Effort normal and breath sounds normal. No stridor. No respiratory distress.  Abdominal: She exhibits no distension.  Musculoskeletal: She exhibits no edema.  Neurological: She is alert and oriented to person, place, and time. No cranial nerve deficit.  Skin: Skin is warm and dry.  Psychiatric: She has a normal mood and affect.  No current suicidal ideation, but the patient does not prior self-harm.    ED Course  Procedures (  including critical care time) Labs Review Labs Reviewed  CBC WITH DIFFERENTIAL - Abnormal; Notable for the following:    Hemoglobin 15.4 (*)    All other components within normal limits  COMPREHENSIVE METABOLIC PANEL - Abnormal; Notable for the following:    AST 119 (*)    ALT 42 (*)    All other components within normal limits  ETHANOL - Abnormal; Notable for the following:    Alcohol, Ethyl (B) 349 (*)    All other components within normal limits  URINALYSIS, ROUTINE W REFLEX MICROSCOPIC - Abnormal; Notable for the following:    Hgb urine dipstick TRACE (*)    All other components within normal limits  PREGNANCY, URINE  URINE MICROSCOPIC-ADD ON  URINE RAPID DRUG SCREEN (HOSP PERFORMED)   Imaging  Review No results found.  EKG Interpretation   None       MDM   Patient female presents with   request for assistance with alcohol abuse. On exam she is awake, alert, hemodynamically stable.  Patient is clinically intoxicated, numerically intoxicated, but is otherwise medically clear for behavioral health consultation.   Carmin Muskrat, MD 05/19/13 [phone]

## 2013-05-19 NOTE — ED Notes (Signed)
Pt requesting something to eat & a blanket. Provided. Pt explained policy for not allowing pt into other rooms & it was not going to happen.

## 2013-05-19 NOTE — [facility] Counselor (Signed)
Writer noted pt's ethanol is 349 @ 1733.  Writer notified EDP ethanol level will need to reach lower level before pt can effectively participate in tele-assessment.  Tele-assessment will be attempted at a later time.

## 2013-05-19 NOTE — ED Notes (Signed)
Pt informed again of the reason for delay. Pt still asking about her friend who is here being seen also. Pt calm & cooperative at this time, NAD noted.

## 2013-05-19 NOTE — ED Notes (Signed)
Pt not in room x 2. Checked boyfriends room.

## 2013-05-19 NOTE — BH Assessment (Signed)
[facility] Assessment Progress Note  At 18:21 I spoke to EDP Dr Vanita Panda in anticipation of [facility]TS assessment.  He reports that pt presents accompanied by her boyfriend, [place] Desanctis.  Both were recently in a rehab program and relapsed shortly after discharge.  Both are seeking detoxification from alcohol today.  Jalene Mullet, MA Triage Specialist 05/19/2013 @ 18:30

## 2013-05-19 NOTE — ED Notes (Signed)
Pt reports needs help getting off of etoh.  Pt says has a friend that is an alcohol abuse counselor in [place] and wants to go there.  Pt denies wanting to hurt herself or others at this time.  Pt says Dec 6th pt tried to jump off of a balcony to kill herself.

## 2013-05-19 NOTE — ED Notes (Signed)
[facility] called & updated on the recent ETOH level,

## 2013-05-20 ENCOUNTER — Encounter (HOSPITAL_COMMUNITY): Payer: Self-pay | Admitting: *Deleted

## 2013-05-20 ENCOUNTER — Inpatient Hospital Stay (HOSPITAL_COMMUNITY)
Admission: EM | Admit: 2013-05-20 | Discharge: 2013-05-21 | DRG: 897 | Disposition: A | Discharge status: Home / Self Care | Payer: 59 | Source: Intra-hospital | Attending: Psychiatry | Admitting: Psychiatry

## 2013-05-20 DIAGNOSIS — F1994 Other psychoactive substance use, unspecified with psychoactive substance-induced mood disorder: Secondary | ICD-10-CM | POA: Diagnosis present

## 2013-05-20 DIAGNOSIS — F1020 Alcohol dependence, uncomplicated: Secondary | ICD-10-CM | POA: Diagnosis present

## 2013-05-20 DIAGNOSIS — F10239 Alcohol dependence with withdrawal, unspecified: Principal | ICD-10-CM | POA: Diagnosis present

## 2013-05-20 DIAGNOSIS — F410 Panic disorder [episodic paroxysmal anxiety] without agoraphobia: Secondary | ICD-10-CM | POA: Diagnosis present

## 2013-05-20 DIAGNOSIS — I10 Essential (primary) hypertension: Secondary | ICD-10-CM | POA: Diagnosis present

## 2013-05-20 DIAGNOSIS — F10939 Alcohol use, unspecified with withdrawal, unspecified: Principal | ICD-10-CM | POA: Diagnosis present

## 2013-05-20 DIAGNOSIS — F17200 Nicotine dependence, unspecified, uncomplicated: Secondary | ICD-10-CM | POA: Diagnosis present

## 2013-05-20 DIAGNOSIS — F329 Major depressive disorder, single episode, unspecified: Secondary | ICD-10-CM | POA: Diagnosis present

## 2013-05-20 MED ORDER — THIAMINE HCL 100 MG/ML IJ SOLN
100.0000 mg | Freq: Once | INTRAMUSCULAR | Status: AC
  Administered 2013-05-20: 100 mg via INTRAMUSCULAR
  Filled 2013-05-20: qty 2

## 2013-05-20 MED ORDER — LOPERAMIDE HCL 2 MG PO CAPS: 2.0000 mg | ORAL_CAPSULE | ORAL | Status: DC | PRN

## 2013-05-20 MED ORDER — CHLORDIAZEPOXIDE HCL 25 MG PO CAPS: 25.0000 mg | ORAL_CAPSULE | Freq: Four times a day (QID) | ORAL | Status: DC | PRN

## 2013-05-20 MED ORDER — ACETAMINOPHEN 325 MG PO TABS: 650.0000 mg | ORAL_TABLET | Freq: Four times a day (QID) | ORAL | Status: DC | PRN

## 2013-05-20 MED ORDER — TRAZODONE HCL 50 MG PO TABS
50.0000 mg | ORAL_TABLET | Freq: Every evening | ORAL | Status: DC | PRN
  Filled 2013-05-20: qty 28

## 2013-05-20 MED ORDER — VITAMIN B-1 100 MG PO TABS
100.0000 mg | ORAL_TABLET | Freq: Every day | ORAL | Status: DC
  Administered 2013-05-21: 100 mg via ORAL
  Filled 2013-05-20 (×3): qty 1

## 2013-05-20 MED ORDER — MAGNESIUM HYDROXIDE 400 MG/5ML PO SUSP: 30.0000 mL | Freq: Every day | ORAL | Status: DC | PRN

## 2013-05-20 MED ORDER — ADULT MULTIVITAMIN W/MINERALS CH
1.0000 | ORAL_TABLET | Freq: Every day | ORAL | Status: DC
  Administered 2013-05-20 – 2013-05-21 (×2): 1 via ORAL
  Filled 2013-05-20 (×4): qty 1

## 2013-05-20 MED ORDER — NONFORMULARY OR COMPOUNDED ITEM: 1.0000 [IU] | Freq: Once | Status: DC

## 2013-05-20 MED ORDER — ONDANSETRON 4 MG PO TBDP: 4.0000 mg | ORAL_TABLET | Freq: Four times a day (QID) | ORAL | Status: DC | PRN

## 2013-05-20 MED ORDER — HYDROXYZINE HCL 25 MG PO TABS: 25.0000 mg | ORAL_TABLET | Freq: Four times a day (QID) | ORAL | Status: DC | PRN

## 2013-05-20 MED ORDER — TUBERCULIN PPD 5 UNIT/0.1ML ID SOLN
5.0000 [IU] | Freq: Once | INTRADERMAL | Status: DC
  Administered 2013-05-20: 5 [IU] via INTRADERMAL
  Filled 2013-05-20: qty 0.1

## 2013-05-20 MED ORDER — ALUM & MAG HYDROXIDE-SIMETH 200-200-20 MG/5ML PO SUSP: 30.0000 mL | ORAL | Status: DC | PRN

## 2013-05-20 NOTE — ED Notes (Signed)
Pt resting calmly w/ eyes closed. Rise & fall of the chest noted. Bed in low position, side rails up x2. NAD noted at this time.  

## 2013-05-20 NOTE — Clinical Social Work Note (Signed)
CSW sent referral to both [facility] and [facility] per pt requets. [facility] has bed available Monday with $300 admission fee and TB test results. Pt has phone assessment at 1:15PM today with [facility] at [phone]. CSW informed MD of need for TB test order. Results must be faxed to [facility] no later than Sunday.   [place], LCSWA  05/20/2013 12:45 PM

## 2013-05-20 NOTE — ED Notes (Signed)
Butch Penny with [facility] requesting pt to wait for transfer d/t pt arriving during med pass. Informed pt nurse Butch Penny that pt has bed and is leaving for transfer at this time as ordered. Receiving nurse voiced understanding. [facility] here for transportation.

## 2013-05-20 NOTE — ED Notes (Signed)
Kaylee Ochoa from [facility] called to advise they had no female beds. This nurse called [name] assessment & advised them of the information provided.

## 2013-05-20 NOTE — BH Assessment (Signed)
Assessment Note  Kaylee Ochoa is a 48 y.o. female presenting to [facility] for alcohol detox.  Pt denies SI/HI/AVH.  Pt has been drinking since she was 48 yrs old--"life is terrible".  Pt reports drinking 2-4 bottles of wine, mixed with 3-4 mini bottles of vodka.  Pt drinks daily, last drink was 05/20/13, she drank 2+ bottles of wine.  Pt c/o w/d sxs: tremors, sweats and hot /cold.  Pt denies issues with seizures, she has blackouts.  Pt has stressors attributing to the increase in alcohol consumption: (1) divorce,(2) children live with ex-spouse, (3) unemployed,(4) financial problems,(5) mother died in 08/14/12.  Pt is requesting rehab services, this writer informed patient that she would need to seek rehab once detox program is complete.    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 Hypertension   . Renal disorder   . Depression   . ETOH abuse    Axis IV: economic problems, occupational problems, other psychosocial or environmental problems, problems related to social environment and problems with primary support group Axis V: 41-50 serious symptoms  Past Medical History:  Past Medical History  Diagnosis Date  . Hypertension   . Renal disorder   . Depression   . ETOH abuse     Past Surgical History  Procedure Laterality Date  . Breast surgery    . Cyst removal on breast      Family History: No family history on file.  Social History:  reports that she has been smoking Cigarettes.  She has a 2.5 pack-year smoking history. She does not have any smokeless tobacco history on file. She reports that she drinks alcohol. She reports that she does not use illicit drugs.  Additional Social History:  Alcohol / Drug Use Pain Medications: None  Prescriptions: None  Over the Counter: None  History of alcohol / drug use?: Yes Longest period of sobriety (when/how long): Only when in detox/rehab  Negative Consequences  of Use: Work / Product/process development scientist relationships;Legal;Financial Withdrawal Symptoms: Fever / Chills;Sweats;Tremors Substance #1 Name of Substance 1: Alcohol--Wine  1 - Age of First Use: 14 YOF  1 - Amount (size/oz): 4 Bottles  1 - Frequency: Daily  1 - Duration: On-going  1 - Last Use / Amount: 05/20/12  CIWA: CIWA-Ar BP: 147/80 mmHg Pulse Rate: 117 Nausea and Vomiting: no nausea and no vomiting Tactile Disturbances: none Tremor: two Auditory Disturbances: not present Paroxysmal Sweats: no sweat visible Visual Disturbances: not present Anxiety: mildly anxious Headache, Fullness in Head: very mild Agitation: two Orientation and Clouding of Sensorium: oriented and can do serial additions CIWA-Ar Total: 6 COWS:    Allergies:  Allergies  Allergen Reactions  . Other Hives, Itching and Rash    UNKNOWN ANTIBIOTIC: Patient cannot recall name of the medication but it casued a rash all over the body, hives, and itching  . Sulfa Antibiotics Hives    Home Medications:  (Not in a hospital admission)  OB/GYN Status:  Patient's last menstrual period was 05/09/2013.  General Assessment Data Location of Assessment: [facility] Is this a Tele or Face-to-Face Assessment?: Tele Assessment Is this an Initial Assessment or a Re-assessment for this encounter?: Initial Assessment Living Arrangements: Alone Can pt return to current living arrangement?: Yes Admission Status: Voluntary Is patient capable of signing voluntary admission?: Yes Transfer from: [facility] Referral Source: MD  Medical Screening Exam ([place]) Medical Exam completed: No Reason for  MSE not completed: Other: (None )  [place] Living Arrangements: Alone Name of Psychiatrist: None  Name of Therapist: None   Education Status Is patient currently in school?: No Current Grade: None  Highest grade of school patient has completed: None  Name of school: None  Contact person: None   Risk to  self Suicidal Ideation: No Suicidal Intent: No Is patient at risk for suicide?: No Suicidal Plan?: No Access to Means: No What has been your use of drugs/alcohol within the last 12 months?: Abusing: alcohol  Previous Attempts/Gestures: No How many times?: 0 Other Self Harm Risks: None  Triggers for Past Attempts: None known Intentional Self Injurious Behavior: None Family Suicide History: No Recent stressful life event(s): Divorce;Job Loss;Financial Problems;Legal Issues;Loss (Comment) (Mother died 72) Persecutory voices/beliefs?: No Depression: Yes Depression Symptoms: Loss of interest in usual pleasures Substance abuse history and/or treatment for substance abuse?: Yes Suicide prevention information given to non-admitted patients: Not applicable  Risk to Others Homicidal Ideation: No Thoughts of Harm to Others: No Current Homicidal Intent: No Current Homicidal Plan: No Access to Homicidal Means: No Identified Victim: None  History of harm to others?: No Assessment of Violence: None Noted Violent Behavior Description: None  Does patient have access to weapons?: No Criminal Charges Pending?: No Does patient have a court date: No  Psychosis Hallucinations: None noted Delusions: None noted  Mental Status Report Appear/Hygiene: Disheveled Eye Contact: Fair Motor Activity: Unremarkable Speech: Logical/coherent Level of Consciousness: Alert Mood: Depressed Affect: Depressed;Appropriate to circumstance Anxiety Level: None Thought Processes: Coherent;Relevant Judgement: Unimpaired Orientation: Person;Place;Time;Situation Obsessive Compulsive Thoughts/Behaviors: None  Cognitive Functioning Concentration: Decreased Memory: Recent Intact;Remote Intact IQ: Average Insight: Fair Impulse Control: Fair Appetite: Fair Weight Loss: 0 Weight Gain: 0 Sleep: Decreased Total Hours of Sleep: 5 Vegetative Symptoms: None  ADLScreening [facility] Assessment Services) Patient's  cognitive ability adequate to safely complete daily activities?: Yes Patient able to express need for assistance with ADLs?: Yes Independently performs ADLs?: Yes (appropriate for developmental age)  Prior Inpatient Therapy Prior Inpatient Therapy: Yes Prior Therapy Dates: 2014, 2012 Prior Therapy Facilty/Provider(s): [facility], [facility] Reason for Treatment: Detox/Rehab   Prior Outpatient Therapy Prior Outpatient Therapy: No Prior Therapy Dates: None  Prior Therapy Facilty/Provider(s): None  Reason for Treatment: None   ADL Screening (condition at time of admission) Patient's cognitive ability adequate to safely complete daily activities?: Yes Is the patient deaf or have difficulty hearing?: No Does the patient have difficulty seeing, even when wearing glasses/contacts?: No Does the patient have difficulty concentrating, remembering, or making decisions?: No Patient able to express need for assistance with ADLs?: Yes Does the patient have difficulty dressing or bathing?: No Independently performs ADLs?: Yes (appropriate for developmental age) Does the patient have difficulty walking or climbing stairs?: No Weakness of Legs: None Weakness of Arms/Hands: None  Home Assistive Devices/Equipment Home Assistive Devices/Equipment: None  Therapy Consults (therapy consults require a physician order) PT Evaluation Needed: No OT Evalulation Needed: No SLP Evaluation Needed: No Abuse/Neglect Assessment (Assessment to be complete while patient is alone) Physical Abuse: Denies Verbal Abuse: Denies Sexual Abuse: Denies Exploitation of patient/patient's resources: Denies Self-Neglect: Denies Values / Beliefs Cultural Requests During Hospitalization: None Spiritual Requests During Hospitalization: None Consults Spiritual Care Consult Needed: No Social Work Consult Needed: No Regulatory affairs officer (For Healthcare) Advance Directive: Patient does not have advance directive;Patient  would not like information Pre-existing out of facility DNR order (yellow form or pink MOST form): No Nutrition Screen- [facility] Adult/[facility] Patient's  home diet: Regular  Additional Information 1:1 In Past 12 Months?: No CIRT Risk: No Elopement Risk: No Does patient have medical clearance?: Yes     Disposition:  Disposition Initial Assessment Completed for this Encounter: Yes Disposition of Patient: Inpatient treatment program;Referred to [facility] ) Type of inpatient treatment program: Adult Patient referred to: Other (Comment) [facility] )  On Site Evaluation by:   Reviewed with Physician:    Girtha Rm 05/20/2013 2:46 AM

## 2013-05-20 NOTE — ED Notes (Signed)
TTS completed. 

## 2013-05-20 NOTE — [facility] Suicide Risk Assessment (Signed)
Suicide Risk Assessment  Admission Assessment     Nursing information obtained from:    Demographic factors:    Current Mental Status:    Loss Factors:    Historical Factors:    Risk Reduction Factors:    Total Time spent with patient: 1 hour  CLINICAL FACTORS:   Alcohol/Substance Abuse/Dependencies   COGNITIVE FEATURES THAT CONTRIBUTE TO RISK:  Closed-mindedness Polarized thinking Thought constriction (tunnel vision)    SUICIDE RISK:   Moderate:  Frequent suicidal ideation with limited intensity, and duration, some specificity in terms of plans, no associated intent, good self-control, limited dysphoria/symptomatology, some risk factors present, and identifiable protective factors, including available and accessible social support.  PLAN OF CARE: Supportive approach/coping skills/relapse prevention                               Identify detox needs                                Reassess and address the co morbidities  I certify that inpatient services furnished can reasonably be expected to improve the patient's condition.  Tiarra Anastacio A 05/20/2013, 3:58 PM

## 2013-05-20 NOTE — BHH Counselor (Signed)
Adult Psychosocial Assessment Update Interdisciplinary Team  Previous [facility] admissions/discharges:  Admissions Discharges  Date: 05/20/13 Date: unknown d/c date.   Date: 05/26/12 Date: 05/27/12  Date: Date:  Date: Date:  Date: Date:   Changes since the last Psychosocial Assessment (including adherence to outpatient mental health and/or substance abuse treatment, situational issues contributing to decompensation and/or relapse). Pt reports that [facility] and stayed sober for about one month. Pt reports that she moved in with her husband too soon and relapsed due to relationship/environmental stressors. Pt stated that she has been to [facility] in summer 2014 but lost her job due to taking time off for treatment. Pt has been unemployed for past 7 months and has been living alone in rented home (unable to pay rent this month and may lose home). Pt reports that she had trouble paying for gas to get to meetings and is at risk of losing her kids (currently with exhusband). She relapsed on alcohol and has been drinking 4-6 bottles of wine for past several months.              Discharge Plan 1. Will you be returning to the same living situation after discharge?   Yes: No:      If no, what is your plan?    Pt hoping for admission into either [facility] in [place] or [place]. Pt plans to follow up at [facility] after treatment for med management and groups.        2. Would you like a referral for services when you are discharged? Yes:     If yes, for what services?  No:       [facility] and [facility]. CSW to fax referrals today.        Summary and Recommendations (to be completed by the evaluator) Pt. Is a 48 year old female presenting for alcohol detox. Pt. Has a history of previous detox, last being 8-12/2012. Pt. States she began drinking the next day. Pt. States her longest period of sobriety is 1 month. Pt reports  drinking 4-5 bottles of wine per day. She denies any drug use. States that she has had a decreased appetite, reporting her last "meal" was probably 1 month ago. Pt. Has no plan at this time for discharge, states that she has an apartment but rent was due yesterday and she was unable to make it.   Recommendations for pt include: crisis stabilization, therapeutic milieu, encourage group attendance and participation, librium taper for withdrawals, medication management for mood stabilization, and development of comprehensive mental wellness/sobriety plan. Pt hoping for admission into either [facility] or [place]. She was also provided with [facility] list per request. Pt plans to follow up at [facility] if she returns to [place].                      Signature:  Smart, Alicia Amel  05/20/2013 10:13 AM

## 2013-05-20 NOTE — Progress Notes (Signed)
D Pt. Denies SI and HI.  NO complaints of pain or discomfort.  A Writer offered support and encouragement.  Discussed discharge plans with pt.  R Pt. Just arrived yesterday but received her TB test this pm due to plans to transfer to a 90 day program tomorrow for ETOH detox.  Pt. Remains safe on the unit.

## 2013-05-20 NOTE — ED Notes (Signed)
TTS placed in room at this time.

## 2013-05-20 NOTE — BHH Counselor (Signed)
Pt accepted by Patriciaann Clan, [place] for detox tx.  Dr. Vinnie Langton) , [phone]

## 2013-05-20 NOTE — Progress Notes (Signed)
[facility] Group Notes:  (Nursing/MHT/Case Management/Adjunct)  Date:  05/20/2013  Time:  2100 Type of Therapy:  wrap up group  Participation Level:  Active  Participation Quality:  Appropriate, Attentive, Sharing and Supportive  Affect:  Appropriate  Cognitive:  Alert  Insight:  Improving  Engagement in Group:  Engaged  Modes of Intervention:  Clarification, Education and Support  Summary of Progress/Problems: Pt shared that she is discharging home tomorrow to transition to a 28 day clean house, [place]. Pt reports that she is committed to sobriety but admits to being in a toxic relationship that " is pretty much over".   Jacques Navy 05/20/2013, 10:42 PM

## 2013-05-20 NOTE — Progress Notes (Signed)
[facility]:Referral faxed [phone] No Female beds available [facility]: Referral Faxed [phone]  [facility], M[facility]

## 2013-05-20 NOTE — Progress Notes (Signed)
Writer informed the nurse Chriss Czar) that the patient has been accepted to [facility].  The nurse will fax the support paperwork to [facility].

## 2013-05-20 NOTE — H&P (Signed)
Psychiatric Admission Assessment Adult  Patient Identification:  Kaylee Ochoa Date of Evaluation:  05/20/2013 Chief Complaint:  Alcohol Dependence History of Present Illness:: 48 Y/o female who after last D/C she went to [facility]. Was there for 28 days. She went to AA and to see her psychiatrist. She went back to work, back to her four kids. Was able to abstain for four months. Went to a camping trip. States her husband was mean to the kids. She could not take it. States they have different parenting skills. After she relapsed  he did not allowed her back home. She went to [facility] [place] for three weeks. She separated and got a divorce. Shortly after she got out of rehab her mother died, she had to deal with the divorce. This was very stressful. She relapsed again. 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 Quality:  presistent relapses. Severity:  severe. Timing:  drinking every day. Duration:  since last relapse several weeks ago. Context:  peristent use of alcohol, last relapse triggered buy her divorce and her mothers death. Associated Signs/Synptoms: Depression Symptoms:  depressed mood, anhedonia, hypersomnia, fatigue, feelings of worthlessness/guilt, difficulty concentrating, hopelessness, panic attacks, loss of energy/fatigue, weight loss, decreased appetite, (Hypo) Manic Symptoms:  Irritable Mood, Labiality of Mood, Anxiety Symptoms:  Excessive Worry, Panic Symptoms, Psychotic Symptoms:  Denies PTSD Symptoms: Negative Total Time spent with patient: 1 hour  Psychiatric Specialty Exam: Physical Exam  Review of Systems  Constitutional: Positive for weight loss and malaise/fatigue.  HENT: Negative.   Eyes: Negative.   Respiratory:       Half a pack a day  Cardiovascular: Negative.   Gastrointestinal: Negative.   Genitourinary:  Negative.   Musculoskeletal: Positive for back pain.  Skin: Negative.   Neurological: Positive for weakness.  Endo/Heme/Allergies: Negative.   Psychiatric/Behavioral: Positive for depression and substance abuse.    Blood pressure 161/95, pulse 90, temperature 98.4 F (36.9 C), temperature source Oral, resp. rate 16, height _0  (1.727 m), weight 56.246 kg (124 lb), last menstrual period 05/09/2013, SpO2 98.00%.Body mass index is 18.86 kg/(m^2).  General Appearance: Fairly Groomed  Engineer, water::  Fair  Speech:  Clear and Coherent and not spontaneous  Volume:  Decreased  Mood:  Anxious and worried  Affect:  Anxious, worried  Thought Process:  Coherent and Goal Directed  Orientation:  Full (Time, Place, and Person)  Thought Content:  symptoms, worries, concerns  Suicidal Thoughts:  No  Homicidal Thoughts:  No  Memory:  Immediate;   Fair Recent;   Fair Remote;   Fair  Judgement:  Fair  Insight:  Present and Shallow  Psychomotor Activity:  Restlessness  Concentration:  Fair  Recall:  [employer] [place]: Fair  Akathisia:  No  Handed:    AIMS (if indicated):     Assets:  Desire for Improvement  Sleep:       Musculoskeletal: Strength & Muscle Tone: within normal limits Gait & Station: normal Patient leans: N/A  Past Psychiatric History: Diagnosis:  Hospitalizations: [place], [facility] [place]  Outpatient Care: Not currently used to see someone at [facility]: [facility], then [facility] (middle of August)  Self-Mutilation: Denies  Suicidal Attempts:Denies  Violent Behaviors:Yes   Past Medical History:   Past Medical History  Diagnosis Date  . Hypertension   .  Renal disorder   . Depression   . ETOH abuse     Allergies:   Allergies  Allergen Reactions  . Other Hives, Itching and Rash    UNKNOWN ANTIBIOTIC: Patient cannot recall name of the medication but it casued a rash all over the body, hives, and itching  .  Sulfa Antibiotics Hives   PTA Medications: No prescriptions prior to admission    Previous Psychotropic Medications:  Medication/Dose  Prozac               Substance Abuse History in the last 12 months:  yes  Consequences of Substance Abuse: Withdrawal Symptoms:   Diaphoresis Diarrhea Tremors  Social History:  reports that she has been smoking Cigarettes.  She has a 2.5 pack-year smoking history. She does not have any smokeless tobacco history on file. She reports that she drinks alcohol. She reports that she does not use illicit drugs. 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 Legal History: assault to government official (drinking) no consequences  Hobbies/Interests:  Family History:  History reviewed. No pertinent family history.  Results for orders placed during the hospital encounter of 05/19/13 (from the past 72 hour(s))  PREGNANCY, URINE     Status: None   Collection Time    05/19/13  5:25 PM      Result Value Range   Preg Test, Ur NEGATIVE  NEGATIVE   Comment:            THE SENSITIVITY OF THIS     METHODOLOGY IS >20 mIU/mL.  URINALYSIS, ROUTINE W REFLEX MICROSCOPIC     Status: Abnormal   Collection Time    05/19/13  5:25 PM      Result Value Range   Color, Urine YELLOW  YELLOW   APPearance CLEAR  CLEAR   Specific Gravity,  Urine 1.015  1.005 - 1.030   pH 5.0  5.0 - 8.0   Glucose, UA NEGATIVE  NEGATIVE mg/dL   Hgb urine dipstick TRACE (*) NEGATIVE   Bilirubin Urine NEGATIVE  NEGATIVE   Ketones, ur NEGATIVE  NEGATIVE mg/dL   Protein, ur NEGATIVE  NEGATIVE mg/dL   Urobilinogen, UA 0.2  0.0 - 1.0 mg/dL   Nitrite NEGATIVE  NEGATIVE   Leukocytes, UA NEGATIVE  NEGATIVE  URINE MICROSCOPIC-ADD ON     Status: None   Collection Time    05/19/13  5:25 PM      Result Value Range   Squamous Epithelial / LPF RARE  RARE   WBC, UA 0-2  <3 WBC/hpf   RBC / HPF 0-2  <3 RBC/hpf   Bacteria, UA RARE  RARE  CBC WITH DIFFERENTIAL     Status: Abnormal   Collection Time    05/19/13  5:33 PM      Result Value Range   WBC 7.9  4.0 - 10.5 K/uL   RBC 4.56  3.87 -  5.11 MIL/uL   Hemoglobin 15.4 (*) 12.0 - 15.0 g/dL   HCT 45.1  36.0 - 46.0 %   MCV 98.9  78.0 - 100.0 fL   MCH 33.8  26.0 - 34.0 pg   MCHC 34.1  30.0 - 36.0 g/dL   RDW 13.1  11.5 - 15.5 %   Platelets 287  150 - 400 K/uL   Neutrophils Relative % 54  43 - 77 %   Neutro Abs 4.2  1.7 - 7.7 K/uL   Lymphocytes Relative 38  12 - 46 %   Lymphs Abs 3.0  0.7 - 4.0 K/uL   Monocytes Relative 7  3 - 12 %   Monocytes Absolute 0.5  0.1 - 1.0 K/uL   Eosinophils Relative 1  0 - 5 %   Eosinophils Absolute 0.1  0.0 - 0.7 K/uL   Basophils Relative 1  0 - 1 %   Basophils Absolute 0.0  0.0 - 0.1 K/uL  COMPREHENSIVE METABOLIC PANEL     Status: Abnormal   Collection Time    05/19/13  5:33 PM      Result Value Range   Sodium 142  137 - 147 mEq/L   Potassium 3.8  3.7 - 5.3 mEq/L   Chloride 101  96 - 112 mEq/L   CO2 23  19 - 32 mEq/L   Glucose, Bld 89  70 - 99 mg/dL   BUN 11  6 - 23 mg/dL   Creatinine, Ser 0.63  0.50 - 1.10 mg/dL   Calcium 8.9  8.4 - 10.5 mg/dL   Total Protein 7.7  6.0 - 8.3 g/dL   Albumin 4.2  3.5 - 5.2 g/dL   AST 119 (*) 0 - 37 U/L   ALT 42 (*) 0 - 35 U/L   Alkaline Phosphatase 103  39 - 117 U/L   Total Bilirubin 0.3  0.3 - 1.2 mg/dL   GFR calc non Af Amer  >90  >90 mL/min   GFR calc Af Amer >90  >90 mL/min   Comment: (NOTE)     The eGFR has been calculated using the CKD EPI equation.     This calculation has not been validated in all clinical situations.     eGFR's persistently <90 mL/min signify possible Chronic Kidney     Disease.  ETHANOL     Status: Abnormal   Collection Time    05/19/13  5:33 PM      Result Value Range   Alcohol, Ethyl (B) 349 (*) 0 - 11 mg/dL   Comment:            LOWEST DETECTABLE LIMIT FOR     SERUM ALCOHOL IS 11 mg/dL     FOR MEDICAL PURPOSES ONLY  URINE RAPID DRUG SCREEN (HOSP PERFORMED)     Status: None   Collection Time    05/19/13  6:23 PM      Result Value Range   Opiates NONE DETECTED  NONE DETECTED   Cocaine NONE DETECTED  NONE DETECTED   Benzodiazepines NONE DETECTED  NONE DETECTED   Amphetamines NONE DETECTED  NONE DETECTED   Tetrahydrocannabinol NONE DETECTED  NONE DETECTED   Barbiturates NONE DETECTED  NONE DETECTED   Comment:            DRUG SCREEN FOR MEDICAL PURPOSES     ONLY.  IF CONFIRMATION IS NEEDED     FOR ANY PURPOSE, NOTIFY LAB     WITHIN 5 DAYS.  LOWEST DETECTABLE LIMITS     FOR URINE DRUG SCREEN     Drug Class       Cutoff (ng/mL)     Amphetamine      1000     Barbiturate      200     Benzodiazepine   638     Tricyclics       453     Opiates          300     Cocaine          300     THC              50  ETHANOL     Status: Abnormal   Collection Time    05/19/13 10:31 PM      Result Value Range   Alcohol, Ethyl (B) 161 (*) 0 - 11 mg/dL   Comment:            LOWEST DETECTABLE LIMIT FOR     SERUM ALCOHOL IS 11 mg/dL     FOR MEDICAL PURPOSES ONLY   Psychological Evaluations:  Assessment:   DSM5:  Schizophrenia Disorders:  nono Obsessive-Compulsive Disorders:  none Trauma-Stressor Disorders:  none Substance/Addictive Disorders:  Alcohol Related Disorder - Severe (303.90) Depressive Disorders:  Major Depressive Disorder - Moderate (296.22)  AXIS I:   Substance Induced Mood Disorder AXIS II:  Deferred AXIS III:   Past Medical History  Diagnosis Date  . Hypertension   . Renal disorder   . Depression   . ETOH abuse    AXIS IV:  economic problems, occupational problems, other psychosocial or environmental problems and problems related to social environment AXIS V:  51-60 moderate symptoms  Treatment Plan/Recommendations:  Supportive approach coping skills/relapse prevention                                                                 Identify need for detox                                                                 Reassess and address the co morbidities Treatment Plan Summary: Daily contact with patient to assess and evaluate symptoms and progress in treatment Medication management Current Medications:  No current facility-administered medications for this encounter.    Observation Level/Precautions:  15 minute checks  Laboratory:  As per the ED  Psychotherapy:  Individual/group  Medications: assess for librium detox  Consultations:    Discharge Concerns:    Estimated LOS: 3-5 days  Other:     I certify that inpatient services furnished can reasonably be expected to improve the patient's condition.   Harlan A 2/5/201511:24 AM

## 2013-05-20 NOTE — Progress Notes (Signed)
Pt. Is a 48 year old female presenting for alcohol detox.  Pt. Has a history of previous detox, last being 8-12/2012.  Pt. States she began drinking the next day.  Pt. States her longest period of sobriety is 1 month.  Pt reports drinking 4-5 bottles of wine per day.  She denies any drug use.  States that she has had a decreased appetite, reporting her last "meal" was probably 1 month ago.  Pt. Has no plan at this time for discharge, states that she has an apartment but rent was due yesterday and she was unable to make it.  Pt. Denies any HI/SI/AVH.

## 2013-05-20 NOTE — ED Notes (Signed)
Attempted to call report was advised all nurses in report till 0730.

## 2013-05-20 NOTE — BHH Group Notes (Signed)
[place] LCSW Group Therapy  05/20/2013 3:16 PM  Type of Therapy:  Group Therapy  Participation Level:  Active  Participation Quality:  Attentive  Affect:  Appropriate  Cognitive:  Alert and Oriented  Insight:  Engaged  Engagement in Therapy:  Engaged  Modes of Intervention:  Confrontation, Discussion, Education, Exploration, Problem-solving, Rapport Building, Socialization and Support  Summary of Progress/Problems:  Finding Balance in Life. Today's group focused on defining balance in one's own words, identifying things that can knock one off balance, and exploring healthy ways to maintain balance in life. Group members were asked to provide an example of a time when they felt off balance, describe how they handled that situation,and process healthier ways to regain balance in the future. Group members were asked to share the most important tool for maintaining balance that they learned while at [facility] and how they plan to apply this method after discharge. Kaylee Ochoa was attentive and engaged throughout today's therapy group. Kaylee Ochoa reported that each day, Kaylee plans to exercise and eat three times a day in order to get her physical health "back on track." Kaylee Ochoa shared that Kaylee also plans to go to [place] to learn better coping skills rather than turning to alcohol. "Also, if I get treatment, my exhusband will allow me to see my children more, which will bring balance to my life." Kaylee Ochoa shows progress in the group setting and improving insight AEB her ability to process how going to treatment and practicing self care will improve her overall sense of life balance.     Smart, Kaliah Haddaway LCSWA  05/20/2013, 3:16 PM

## 2013-05-20 NOTE — BHH Suicide Risk Assessment (Signed)
[place] INPATIENT: Family/Significant Other Suicide Prevention Education  Suicide Prevention Education:  Education Completed; No one has been identified by the patient as the family member/significant other with whom the patient will be residing, and identified as the person(s) who will aid the patient in the event of a mental health crisis (suicidal ideations/suicide attempt).   Pt did not c/o SI at admission, nor have they endorsed SI during their stay here. SPE not required. SPI pamphlet provided to pt and she was encouraged to share information with support network, ask questions, and talk about any concerns relating to SPE.  [place], [place] 05/20/2013 10:28 AM

## 2013-05-20 NOTE — Clinical Social Work Note (Signed)
CSW arranged transportation with [facility] for transport back to [facility] at 11:00AM (pt has no transport back to [facility]).   [place], [place]  05/20/2013 3:51 PM

## 2013-05-21 MED ORDER — TRAZODONE HCL 50 MG PO TABS
50.0000 mg | ORAL_TABLET | Freq: Every evening | ORAL | Status: DC | PRN
Start: 2013-05-21 — End: 2014-05-09

## 2013-05-21 NOTE — Progress Notes (Signed)
Pt discharged per MD orders; pt currently denies SI/HI and auditory/visual hallucinations; pt was given education by RN regarding follow-up appointments and medications and pt denied any questions or concerns about these instructions; pt was then escorted to search room to retrieve her belongings by RN before being discharged to hospital lobby. 

## 2013-05-21 NOTE — Tx Team (Signed)
Interdisciplinary Treatment Plan Update (Adult)  Date: 05/21/2013   Time Reviewed: 10:35 AM  Progress in Treatment:  Attending groups: Yes  Participating in groups:  Yes  Taking medication as prescribed: Yes  Tolerating medication: Yes  Family/Significant othe contact made: No. SPE not required for this pt.   Patient understands diagnosis: Yes, AEB seeking treatment for ETOH detox and mood stabilization.  Discussing patient identified problems/goals with staff: Yes  Medical problems stabilized or resolved: Yes  Denies suicidal/homicidal ideation: Yes during admission, group, self report.  Patient has not harmed self or Others: Yes  New problem(s) identified:  Discharge Plan or Barriers: Pt accepted into [facility] for Monday at 10am and has followup scheduled with Tamela Gammon for med management/assessment for services. [facility] to transport pt to [facility] [place] today.  Additional comments: 48 Y/o female who after last D/C she went to [facility]. Was there for 28 days. She went to AA and to see her psychiatrist. She went back to work, back to her four kids. Was able to abstain for four months. Went to a camping trip. States her husband was mean to the kids. She could not take it. States they have different parenting skills. After she relapsed he did not allowed her back home. She went to [facility] for three weeks. She separated and got a divorce. Shortly after she got out of rehab her mother died, she had to deal with the divorce. This was very stressful. She relapsed again. 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   Reason for Continuation of Hospitalization: d/c today  Estimated length of stay:  D/c today  For review of initial/current patient goals, please see plan of care.  Attendees:  Patient:    Family:    Physician: Carlton Adam MD 05/21/2013 10:35 AM   Nursing: Marye Round RN  05/21/2013  10:35 AM   Clinical Social Worker [place], [place]  05/21/2013 10:35 AM   Other: Hardie Pulley. PA  05/21/2013 10:35 AM   Other:  05/21/2013 10:35 AM   Other:Jennifer C. Nurse CM  05/21/2013. 10:35 AM   Other:    Scribe for Treatment Team:  [place] LCSWA 05/21/2013 10:35 AM
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
## 2013-05-21 NOTE — Progress Notes (Signed)
Pt observed resting in bed with eyes closed. RR WNL, even and unlabored. No acute distress. Level III obs in place for safety and pt is safe. Kaylee Ochoa

## 2013-05-21 NOTE — Progress Notes (Signed)
[facility] Adult Case Management Discharge Plan :  Will you be returning to the same living situation after discharge: Yes,  home until [facility] Admission on monday At discharge, do you have transportation home?:Yes,  [employer] or taxi-CSW working on transportation currently.  Do you have the ability to pay for your medications:Yes,  mental health  Release of information consent forms completed and submitted to Medical Records by CSW.  Patient to Follow up at: Follow-up Information   Follow up with [facility] On 05/24/2013. (Admission scheduled for 10AM. Please call Monica with any questions. )    Contact information:   [address] [place], [place] [address] Phone: [phone] Fax: [phone]      Follow up with [facility] On 05/26/2013. (Arrive between 8am-11:AM for hospital followup/medication management. )    Contact information:   [address] [place], [place] [address] Phone: [phone] Fax: [phone]      Patient denies SI/HI:   Yes,  during admission, self report, and group.    Safety Planning and Suicide Prevention discussed:  Yes,  SPE not required for this pt as she did not endorse SI during admission or stay. SPI pamphlet provided to pt and she was encouraged to share information with support network, ask questions, and talk about any concerns relating to SPE.  Smart, Jayziah Bankhead LCSWA  05/21/2013, 10:46 AM

## 2013-05-21 NOTE — BHH Suicide Risk Assessment (Signed)
Suicide Risk Assessment  Discharge Assessment     Demographic Factors:  Caucasian  Total Time spent with patient: 45 minutes  Psychiatric Specialty Exam:     Blood pressure 144/101, pulse 84, temperature 97.9 F (36.6 C), temperature source Oral, resp. rate 18, height 5\' 8"  (1.727 m), weight 56.246 kg (124 lb), last menstrual period 05/09/2013, SpO2 98.00%.Body mass index is 18.86 kg/(m^2).  General Appearance: Fairly Groomed  Engineer, water::  Fair  Speech:  Clear and Coherent  Volume:  Normal  Mood:  Euthymic  Affect:  Appropriate  Thought Process:  Coherent and Goal Directed  Orientation:  Full (Time, Place, and Person)  Thought Content:  relapse prevention plan  Suicidal Thoughts:  No  Homicidal Thoughts:  No  Memory:  Immediate;   Fair Recent;   Fair Remote;   Fair  Judgement:  Fair  Insight:  Present  Psychomotor Activity:  Normal  Concentration:  Fair  Recall:  [employer]  Language: Fair  Akathisia:  No  Handed:    AIMS (if indicated):     Assets:  Desire for Improvement  Sleep:  Number of Hours: 6.25    Musculoskeletal: Strength & Muscle Tone: within normal limits Gait & Station: normal Patient leans: N/A   Mental Status Per Nursing Assessment::   On Admission:     Current Mental Status by Physician: No active S/S of withdrawal   Loss Factors: Loss of significant relationship  Historical Factors: NA  Risk Reduction Factors:   Sense of responsibility to family and Positive social support  Continued Clinical Symptoms:  Alcohol/Substance Abuse/Dependencies  Cognitive Features That Contribute To Risk:  Closed-mindedness Polarized thinking Thought constriction (tunnel vision)    Suicide Risk:  Minimal: No identifiable suicidal ideation.  Patients presenting with no risk factors but with morbid ruminations; may be classified as minimal risk based on the severity of the depressive symptoms  Discharge Diagnoses:   AXIS I:   Alcohol Dependence, GAD, substance induced mood disorder AXIS II:  Deferred AXIS III:   Past Medical History  Diagnosis Date  . Hypertension   . Renal disorder   . Depression   . ETOH abuse    AXIS IV:  other psychosocial or environmental problems AXIS V:  61-70 mild symptoms  Plan Of Care/Follow-up recommendations:  Activity:  as tolerated Diet:  regular [place] Is patient on multiple antipsychotic therapies at discharge:  No   Has Patient had three or more failed trials of antipsychotic monotherapy by history:  No  Recommended Plan for Multiple Antipsychotic Therapies: NA    Kaylee Ochoa A 05/21/2013, 9:35 AM

## 2013-05-21 NOTE — BHH Group Notes (Signed)
[facility] LCSW Aftercare Discharge Planning Group Note   05/21/2013 10:08 AM  Participation Quality:  Appropriate   Mood/Affect:  Appropriate  Depression Rating:  1  Anxiety Rating:  1  Thoughts of Suicide:  No Will you contract for safety?   NA  Current AVH:  No  Plan for Discharge/Comments:  Pt has admission into [facility] on Monday and followup at [facility] for next week/med management. Pt had TB test-results must be faxed to [place] over the weekend.   Transportation Means: [employer] coming at [place] for transport to [facility].   Supports: none identified.   Smart, [employer]
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
## 2013-05-26 NOTE — Progress Notes (Signed)
Patient Discharge Instructions:  After Visit Summary (AVS):   Faxed to:  05/26/13 Discharge Summary Note:   Faxed to:  05/26/13 Psychiatric Admission Assessment Note:   Faxed to:  05/26/13 Suicide Risk Assessment - Discharge Assessment:   Faxed to:  05/26/13 Faxed/Sent to the Next Level Care provider:  05/26/13 Faxed to [facility] @ [phone] Faxed to [facility] @ [place], 05/26/2013, 3:30 PM

## 2014-05-03 ENCOUNTER — Emergency Department (HOSPITAL_COMMUNITY)
Admission: EM | Admit: 2014-05-03 | Discharge: 2014-05-04 | Disposition: A | Discharge status: Home / Self Care | Payer: Self-pay | Attending: Emergency Medicine | Admitting: Emergency Medicine

## 2014-05-03 ENCOUNTER — Encounter (HOSPITAL_COMMUNITY): Payer: Self-pay | Admitting: Emergency Medicine

## 2014-05-03 DIAGNOSIS — W1839XA Other fall on same level, initial encounter: Secondary | ICD-10-CM | POA: Insufficient documentation

## 2014-05-03 DIAGNOSIS — Y998 Other external cause status: Secondary | ICD-10-CM | POA: Insufficient documentation

## 2014-05-03 DIAGNOSIS — Y9289 Other specified places as the place of occurrence of the external cause: Secondary | ICD-10-CM | POA: Insufficient documentation

## 2014-05-03 DIAGNOSIS — F329 Major depressive disorder, single episode, unspecified: Secondary | ICD-10-CM | POA: Insufficient documentation

## 2014-05-03 DIAGNOSIS — Z720 Tobacco use: Secondary | ICD-10-CM | POA: Insufficient documentation

## 2014-05-03 DIAGNOSIS — I10 Essential (primary) hypertension: Secondary | ICD-10-CM | POA: Insufficient documentation

## 2014-05-03 DIAGNOSIS — Y9389 Activity, other specified: Secondary | ICD-10-CM | POA: Insufficient documentation

## 2014-05-03 DIAGNOSIS — S0081XA Abrasion of other part of head, initial encounter: Secondary | ICD-10-CM | POA: Insufficient documentation

## 2014-05-03 DIAGNOSIS — F1310 Sedative, hypnotic or anxiolytic abuse, uncomplicated: Secondary | ICD-10-CM | POA: Insufficient documentation

## 2014-05-03 DIAGNOSIS — Z8742 Personal history of other diseases of the female genital tract: Secondary | ICD-10-CM | POA: Insufficient documentation

## 2014-05-03 DIAGNOSIS — F10220 Alcohol dependence with intoxication, uncomplicated: Secondary | ICD-10-CM | POA: Insufficient documentation

## 2014-05-03 LAB — CBC
HCT: 45.4 % (ref 36.0–46.0)
Hemoglobin: 15 g/dL (ref 12.0–15.0)
MCH: 36 pg — AB (ref 26.0–34.0)
MCHC: 33 g/dL (ref 30.0–36.0)
MCV: 108.9 fL — AB (ref 78.0–100.0)
PLATELETS: 341 10*3/uL (ref 150–400)
RBC: 4.17 MIL/uL (ref 3.87–5.11)
RDW: 15.1 % (ref 11.5–15.5)
WBC: 9.6 10*3/uL (ref 4.0–10.5)

## 2014-05-03 LAB — COMPREHENSIVE METABOLIC PANEL
ALBUMIN: 4 g/dL (ref 3.5–5.2)
ALT: 103 U/L — AB (ref 0–35)
AST: 208 U/L — AB (ref 0–37)
Alkaline Phosphatase: 263 U/L — ABNORMAL HIGH (ref 39–117)
Anion gap: 8 (ref 5–15)
BUN: 5 mg/dL — ABNORMAL LOW (ref 6–23)
CO2: 26 mmol/L (ref 19–32)
Calcium: 9.2 mg/dL (ref 8.4–10.5)
Chloride: 107 mEq/L (ref 96–112)
Creatinine, Ser: 0.51 mg/dL (ref 0.50–1.10)
Glucose, Bld: 126 mg/dL — ABNORMAL HIGH (ref 70–99)
Potassium: 3.9 mmol/L (ref 3.5–5.1)
Sodium: 141 mmol/L (ref 135–145)
TOTAL PROTEIN: 7.7 g/dL (ref 6.0–8.3)
Total Bilirubin: 0.9 mg/dL (ref 0.3–1.2)

## 2014-05-03 LAB — RAPID URINE DRUG SCREEN, HOSP PERFORMED
Amphetamines: NOT DETECTED
BARBITURATES: NOT DETECTED
BENZODIAZEPINES: POSITIVE — AB
COCAINE: NOT DETECTED
Opiates: NOT DETECTED
Tetrahydrocannabinol: NOT DETECTED

## 2014-05-03 LAB — ACETAMINOPHEN LEVEL

## 2014-05-03 LAB — ETHANOL: Alcohol, Ethyl (B): 432 mg/dL (ref 0–9)

## 2014-05-03 LAB — SALICYLATE LEVEL

## 2014-05-03 MED ORDER — ONDANSETRON HCL 4 MG PO TABS: 4.0000 mg | ORAL_TABLET | Freq: Three times a day (TID) | ORAL | Status: DC | PRN

## 2014-05-03 MED ORDER — NICOTINE 21 MG/24HR TD PT24
21.0000 mg | MEDICATED_PATCH | Freq: Every day | TRANSDERMAL | Status: DC
  Administered 2014-05-03: 21 mg via TRANSDERMAL
  Filled 2014-05-03: qty 1

## 2014-05-03 MED ORDER — ALUM & MAG HYDROXIDE-SIMETH 200-200-20 MG/5ML PO SUSP: 30.0000 mL | ORAL | Status: DC | PRN

## 2014-05-03 MED ORDER — VITAMIN B-1 100 MG PO TABS: 100.0000 mg | ORAL_TABLET | Freq: Every day | ORAL | Status: DC

## 2014-05-03 MED ORDER — LORAZEPAM 1 MG PO TABS
0.0000 mg | ORAL_TABLET | Freq: Four times a day (QID) | ORAL | Status: DC
  Administered 2014-05-03 (×2): 1 mg via ORAL
  Filled 2014-05-03 (×2): qty 1

## 2014-05-03 MED ORDER — THIAMINE HCL 100 MG/ML IJ SOLN
100.0000 mg | Freq: Every day | INTRAMUSCULAR | Status: DC
Start: 2014-05-03 — End: 2014-05-04

## 2014-05-03 MED ORDER — LORAZEPAM 1 MG PO TABS: 0.0000 mg | ORAL_TABLET | Freq: Two times a day (BID) | ORAL | Status: DC

## 2014-05-03 NOTE — ED Notes (Signed)
Pt asked for more Ativan to help her sleep. Explained to pt that she was not due for ativan at this time and that it would be given later. Pt understood.

## 2014-05-03 NOTE — ED Notes (Signed)
Physical assessment deferred due to sedation.  Resting quietly with eyes closed.  Respirations easy and unlabored.

## 2014-05-03 NOTE — ED Notes (Addendum)
Pt. Is unable to use the restroom at this time, but is aware that we need a urine specimen. Pt. Family took two belonging bags to the car.

## 2014-05-03 NOTE — ED Notes (Signed)
Pt here for ETOH detox, last drink 30 minutes ago (1315), minor laceration to forehead, pt states she fell, hit her head, and lost consciousness today. Pt's fiance died this week, pt dealing with stresses from this.  Pt's family worry that patient may harm self. Pt is currently intoxicated.

## 2014-05-03 NOTE — BH Assessment (Signed)
[facility] Assessment Progress Note      Attempted to set up telepsych machine for consult.  Pt RN, Lorenda Ishihara Chrismon reports the patient is "out of it" and blood alcohol was 432.  Pt unable to be assessed at this time.  Requested ED remove order for TTS consult and reorder when pt is appropriate for evaluation.  Notified TTS counselor in ED, Waldon Merl, so she is also aware.

## 2014-05-03 NOTE — BH Assessment (Signed)
Assessment Note  Kaylee Ochoa is an 49 y.o. female with history of depression and alcohol abuse.  Pt's nurse, Luanne  reports the patient is "out of it" and blood alcohol was 432.Pt unable to cooperate in a [facility] assessed at this time. Patient to be re-assessed at a later time when more alert and cooperative.   Per ED note written by [name], patient is a female who presents to the Emergency Department for ETOH detox. Patient admits to having alcohol problems and notes she is a heavy/constant drinker. Patient states she drank prior to arrival is is currently drunk. She states that she had a fall earlier today and got a small cut on her head. Patient notes she also hurt her back. Patient notes that she has been sick recently and lost 35 pounds. She denies any suicidal thoughts to me -- per nursing note family is under a lot of stress and they are concerned she may harm herself. Per friend she states that she spoke with the patient last night about coming into [place] today and getting help for her ETOH abuse.   Per Reginold Agent, NP patient to remain in the ED overnight. Patient to be re-evaluated by psychiatry in the am.    Axis I: Alcohol Dependence and Depressive Disorder NOS Axis II: Deferred Axis III:  Past Medical History  Diagnosis Date  . Hypertension   . Renal disorder   . Depression   . ETOH abuse    Axis IV: other psychosocial or environmental problems, problems related to social environment, problems with access to health care services and problems with primary support group Axis V: 31-40 impairment in reality testing  Past Medical History:  Past Medical History  Diagnosis Date  . Hypertension   . Renal disorder   . Depression   . ETOH abuse     Past Surgical History  Procedure Laterality Date  . Breast surgery    . Cyst removal on breast      Family History: History reviewed. No pertinent family history.  Social History:  reports that she has been smoking  Cigarettes.  She has a 2.5 pack-year smoking history. She does not have any smokeless tobacco history on file. She reports that she drinks alcohol. She reports that she does not use illicit drugs.  Additional Social History:  Alcohol / Drug Use Pain Medications: SEE MAR Prescriptions: SEE MAR (Per EPIC notes patient prescribed Trazadone) Over the Counter: SEE MAR History of alcohol / drug use?: Yes Substance #1 Name of Substance 1: Patient not appropriate for evaluation at this time. Collateral inforamtion to be gatthered and/or patient to be re-assessed when alert.  Per previous notes patient has a hx of alcohol dependence.  1 - Age of First Use: Per previous EPIC notes patient drinking since age 47 1 - Amount (size/oz): Unable to obtain cut 1 - Frequency: Patient not appropriate for evaluation at this time. Collateral inforamtion to be gatthered and/or patient to be re-assessed when alert.  Per previous notes patient has a hx of drinking 2-4 bottles of wine, mixed with 3-4 mini bottles of vodka 1 - Duration: on-going  1 - Last Use / Amount: Patient presents to the ED intoxicated. Possibly patient's last drink was today. Collateral inforamtion to be gatthered and/or patient to be re-assessed when alert for more accurate information.   CIWA: CIWA-Ar BP: 134/87 mmHg Pulse Rate: 97 COWS:    Allergies:  Allergies  Allergen Reactions  . Other Hives, Itching and  Rash    UNKNOWN ANTIBIOTIC: Patient cannot recall name of the medication but it casued a rash all over the body, hives, and itching  . Sulfa Antibiotics Hives    Home Medications:  (Not in a hospital admission)  OB/GYN Status:  No LMP recorded.  General Assessment Data Location of Assessment: [facility] Is this a Tele or Face-to-Face Assessment?: Face-to-Face Is this an Initial Assessment or a Re-assessment for this encounter?: Initial Assessment Living Arrangements: Alone, Other (Comment) (Alone per previous notes ) Can pt return  to current living arrangement?: Yes Admission Status: Voluntary Is patient capable of signing voluntary admission?: No Transfer from: [facility] Referral Source: Self/Family/Friend     [place] Living Arrangements: Alone, Other (Comment) (Alone per previous notes ) Name of Psychiatrist:  Tomasita Crumble ) Name of Therapist:  (unk)  Education Status Is patient currently in school?:  (unk ) Current Grade:  (n/a)  Risk to self with the past 6 months Suicidal Ideation:  (unable to determine ) Suicidal Intent:  (unable to determine ) Is patient at risk for suicide?:  (unable to determine ) Suicidal Plan?:  (unable to determine ) Access to Means:  (unable to determine ) What has been your use of drugs/alcohol within the last 12 months?:  (unable to determine ) Previous Attempts/Gestures:  (unable to determine ) How many times?:  (unable to determine ) Other Self Harm Risks:  (n/a) Triggers for Past Attempts:  (unable to determine ) Intentional Self Injurious Behavior:  (unable to determine ) Family Suicide History: Unknown Recent stressful life event(s): Other (Comment) (unable to determine ) Persecutory voices/beliefs?:  (unable to determine ) Depression: Yes Depression Symptoms: Feeling angry/irritable, Feeling worthless/self pity, Fatigue, Tearfulness, Insomnia, Despondent, Loss of interest in usual pleasures, Guilt, Isolating Substance abuse history and/or treatment for substance abuse?: No Suicide prevention information given to non-admitted patients: Yes  Risk to Others within the past 6 months Homicidal Ideation: No Thoughts of Harm to Others: No Current Homicidal Intent: No Current Homicidal Plan: No Access to Homicidal Means: No Identified Victim:  (n/a) History of harm to others?: No Assessment of Violence: None Noted Violent Behavior Description:  (patient calm and cooperative ) Does patient have access to weapons?: No Criminal Charges Pending?: No Does  patient have a court date: No  Psychosis Hallucinations: None noted Delusions: None noted  Mental Status Report Appear/Hygiene: Disheveled Eye Contact: Unable to Assess Motor Activity: Unable to assess Speech: Unable to assess Level of Consciousness: Unable to assess Mood:  (unable to determine ) Affect: Unable to Assess Anxiety Level:  (UTA) Thought Processes: Unable to Assess Judgement: Unable to Assess Orientation: Unable to assess Obsessive Compulsive Thoughts/Behaviors: Unable to Assess  Cognitive Functioning Concentration: Unable to Assess Memory: Unable to Assess IQ: Average Insight: Unable to Assess Impulse Control: Unable to Assess Appetite: Poor Weight Loss:  (unk ) Weight Gain:  (unk) Sleep: Unable to Assess Total Hours of Sleep:  (unk) Vegetative Symptoms: Unable to Assess  ADLScreening [facility] Assessment Services) Patient's cognitive ability adequate to safely complete daily activities?: Yes Patient able to express need for assistance with ADLs?:  (Unable to determine. Collateral inforamtion to be gatthered and/or patient to be re-assessed when alert. to gain more accurate inforamtion ) Independently performs ADLs?: No (Collateral inforamtion to be gatthered and/or patient to be re-assessed when alert.  )  Prior Inpatient Therapy Prior Inpatient Therapy: Yes Prior Therapy Dates:  [facility]- 05/2013 and 05/2012) Prior Therapy Facilty/Provider(s):  [facility]) Reason for Treatment:  (  substance abuse )  Prior Outpatient Therapy Prior Outpatient Therapy:  (UTA) Prior Therapy Dates:  (n/a) Prior Therapy Facilty/Provider(s):  (n/a) Reason for Treatment:  (n/a)  ADL Screening (condition at time of admission) Patient's cognitive ability adequate to safely complete daily activities?: Yes Is the patient deaf or have difficulty hearing?: No Does the patient have difficulty seeing, even when wearing glasses/contacts?: No Does the patient have difficulty concentrating,  remembering, or making decisions?: No Patient able to express need for assistance with ADLs?:  (Unable to determine. Collateral inforamtion to be gatthered and/or patient to be re-assessed when alert. to gain more accurate inforamtion ) Does the patient have difficulty dressing or bathing?: No Independently performs ADLs?: No (Collateral inforamtion to be gatthered and/or patient to be re-assessed when alert.  ) Does the patient have difficulty walking or climbing stairs?: No Weakness of Legs: None Weakness of Arms/Hands: None  Home Assistive Devices/Equipment Home Assistive Devices/Equipment: None    Abuse/Neglect Assessment (Assessment to be complete while patient is alone) Physical Abuse: Denies Verbal Abuse: Denies Sexual Abuse: Denies Exploitation of patient/patient's resources: Denies Self-Neglect: Denies Values / Beliefs Cultural Requests During Hospitalization: None Spiritual Requests During Hospitalization: None   Advance Directives (For Healthcare) Does patient have an advance directive?: No    Additional Information 1:1 In Past 12 Months?: No CIRT Risk: No Elopement Risk: No Does patient have medical clearance?: Yes     Disposition:  Disposition Initial Assessment Completed for this Encounter: Yes Disposition of Patient: Other dispositions (Per Reginold Agent, NP pt. to be re-evaluted in the am by psychia)  On Site Evaluation by:   Reviewed with Physician:    Waldon Merl Annie [facility] 05/03/2014 5:17 PM

## 2014-05-03 NOTE — ED Notes (Signed)
Patient states her longest period of sobriety was 3 months when she was in a sober living house.  States at that time she was able to see her 4 children whom she has lost custody.  Scheduled Ativan held due to decreased BP and absence of elevated CIWA.  Fluids /food given and tolerated well.

## 2014-05-03 NOTE — ED Provider Notes (Signed)
CSN: [id number]     Arrival date & time 05/03/14  1333 History  This chart was scribed for non-physician practitioner, Earmon Phoenix, working with Orpah Greek, by Ian Bushman, ED Scribe. This patient was seen in room WTR4/[facility]PT4 and the patient's care was started at 2:10 PM.    First MD Initiated Contact with Patient 05/03/14 1407     Chief Complaint  Patient presents with  . Suicidal  . ETOH detox      (Consider location/radiation/quality/duration/timing/severity/associated sxs/prior Treatment)   The history is provided by the patient.    HPI Comments: Kaylee Ochoa is a 49 y.o. female who presents to the Emergency Department for ETOH detox.  Patient admits to having alcohol problems and notes she is a heavy/constant drinker. Patient states she drank prior to arrival is is currently drunk. She states that she had a fall earlier today and got a small cut on her head. Patient notes she also hurt her back. Patient notes that she has been sick recently and lost 35 pounds. She denies any suicidal thoughts to me -- per nursing note family is under a lot of stress and they are concerned she may harm herself. Per friend she states that she spoke with the patient last night about coming into [place] today and getting help for her ETOH abuse. Level V caveat due to alcohol intoxication.   Past Medical History  Diagnosis Date  . Hypertension   . Renal disorder   . Depression   . ETOH abuse    Past Surgical History  Procedure Laterality Date  . Breast surgery    . Cyst removal on breast     History reviewed. No pertinent family history. History  Substance Use Topics  . Smoking status: Current Every Day Smoker -- 0.25 packs/day for 10 years    Types: Cigarettes  . Smokeless tobacco: Not on file  . Alcohol Use: Yes     Comment: heavily daily   OB History    No data available     Review of Systems  Unable to perform ROS: Mental status change      Allergies   Other and Sulfa antibiotics  Home Medications   Prior to Admission medications   Medication Sig Start Date End Date Taking? Authorizing Provider  traZODone (DESYREL) 50 MG tablet Take 1 tablet (50 mg total) by mouth at bedtime as needed for sleep (may repeat X 1). For sleep 05/21/13   Encarnacion Slates, NP   BP 134/87 mmHg  Pulse 97  Temp(Src) 97.8 F (36.6 C) (Oral)  Resp 16  SpO2 97% Physical Exam  Constitutional: She appears well-developed and well-nourished.  HENT:  Head: Normocephalic. Head is without raccoon's eyes and without Battle's sign.  Right Ear: Tympanic membrane, external ear and ear canal normal. No hemotympanum.  Left Ear: Tympanic membrane, external ear and ear canal normal. No hemotympanum.  Nose: Nose normal. No nasal septal hematoma.  Mouth/Throat: Uvula is midline, oropharynx is clear and moist and mucous membranes are normal.  Small abrasion of mid-forehead just posterior to hairline.   Eyes: Conjunctivae, EOM and lids are normal. Pupils are equal, round, and reactive to light. Right eye exhibits no nystagmus. Left eye exhibits no nystagmus.  No visible hyphema noted  Neck: Normal range of motion. Neck supple.  Cardiovascular: Normal rate and regular rhythm.   Pulmonary/Chest: Effort normal and breath sounds normal.  Abdominal: Soft. There is no tenderness.  Musculoskeletal:  Cervical back: She exhibits normal range of motion, no tenderness and no bony tenderness.       Thoracic back: She exhibits no tenderness and no bony tenderness.       Lumbar back: She exhibits no tenderness and no bony tenderness.  Neurological: She is alert. She has normal strength and normal reflexes. No cranial nerve deficit or sensory deficit. Coordination abnormal. GCS eye subscore is 4. GCS verbal subscore is 5. GCS motor subscore is 6.  Slurred speech, unsteady gait, pt is intoxication.   Skin: Skin is warm and dry.  Psychiatric: She has a normal mood and affect.  Nursing note  and vitals reviewed.   ED Course  Procedures (including critical care time)   DIAGNOSTIC STUDIES: Oxygen Saturation is 97% on RA, adequate by my interpretation.    COORDINATION OF CARE: 2:17 PM Discussed treatment plan with patient at beside, the patient agrees with the plan and has no further questions at this time.  Labs Review Labs Reviewed  ACETAMINOPHEN LEVEL - Abnormal; Notable for the following:    Acetaminophen (Tylenol), Serum <10.0 (*)    All other components within normal limits  CBC - Abnormal; Notable for the following:    MCV 108.9 (*)    MCH 36.0 (*)    All other components within normal limits  COMPREHENSIVE METABOLIC PANEL - Abnormal; Notable for the following:    Glucose, Bld 126 (*)    BUN 5 (*)    AST 208 (*)    ALT 103 (*)    Alkaline Phosphatase 263 (*)    All other components within normal limits  ETHANOL - Abnormal; Notable for the following:    Alcohol, Ethyl (B) 432 (*)    All other components within normal limits  SALICYLATE LEVEL  URINE RAPID DRUG SCREEN (HOSP PERFORMED)    Imaging Review No results found.   EKG Interpretation None       3:30 PM Patient seen and examined. Work-up initiated. Holding orders completed.   Vital signs reviewed and are as follows: BP 134/87 mmHg  Pulse 97  Temp(Src) 97.8 F (36.6 C) (Oral)  Resp 16  SpO2 97%  Labs reviewed. Her alcohol is 432. She will need to sober up prior to being seen by [facility].   8:30 PM Pt medically cleared. She will need TTS assessment when she is sober.   Dr. Alvino Chapel made aware of patient at end-of-shift.    MDM   Final diagnoses:  Alcohol dependence with uncomplicated intoxication   Medically cleared. Do not suspect head injury. Pt is clinically intoxicated.   I personally performed the services described in this documentation, which was scribed in my presence. The recorded information has been reviewed and is accurate.    [name], PA-C 05/03/14  2032  Orpah Greek, MD 05/06/14 [phone]

## 2014-05-04 ENCOUNTER — Encounter (HOSPITAL_COMMUNITY): Payer: Self-pay | Admitting: Behavioral Health

## 2014-05-04 ENCOUNTER — Inpatient Hospital Stay (HOSPITAL_COMMUNITY)
Admission: EM | Admit: 2014-05-04 | Discharge: 2014-05-09 | DRG: 897 | Disposition: A | Discharge status: Home / Self Care | Payer: 59 | Source: Intra-hospital | Attending: Psychiatry | Admitting: Psychiatry

## 2014-05-04 DIAGNOSIS — I10 Essential (primary) hypertension: Secondary | ICD-10-CM | POA: Diagnosis present

## 2014-05-04 DIAGNOSIS — F1019 Alcohol abuse with unspecified alcohol-induced disorder: Secondary | ICD-10-CM | POA: Diagnosis present

## 2014-05-04 DIAGNOSIS — F19929 Other psychoactive substance use, unspecified with intoxication, unspecified: Secondary | ICD-10-CM

## 2014-05-04 DIAGNOSIS — F1020 Alcohol dependence, uncomplicated: Secondary | ICD-10-CM | POA: Diagnosis present

## 2014-05-04 DIAGNOSIS — F419 Anxiety disorder, unspecified: Secondary | ICD-10-CM | POA: Diagnosis present

## 2014-05-04 DIAGNOSIS — Y908 Blood alcohol level of 240 mg/100 ml or more: Secondary | ICD-10-CM | POA: Diagnosis present

## 2014-05-04 DIAGNOSIS — F17210 Nicotine dependence, cigarettes, uncomplicated: Secondary | ICD-10-CM | POA: Diagnosis present

## 2014-05-04 DIAGNOSIS — F10288 Alcohol dependence with other alcohol-induced disorder: Principal | ICD-10-CM | POA: Diagnosis present

## 2014-05-04 DIAGNOSIS — F10239 Alcohol dependence with withdrawal, unspecified: Secondary | ICD-10-CM | POA: Diagnosis present

## 2014-05-04 DIAGNOSIS — F1994 Other psychoactive substance use, unspecified with psychoactive substance-induced mood disorder: Secondary | ICD-10-CM | POA: Diagnosis not present

## 2014-05-04 DIAGNOSIS — F10939 Alcohol use, unspecified with withdrawal, unspecified: Secondary | ICD-10-CM | POA: Diagnosis present

## 2014-05-04 DIAGNOSIS — G4700 Insomnia, unspecified: Secondary | ICD-10-CM | POA: Diagnosis present

## 2014-05-04 DIAGNOSIS — Z634 Disappearance and death of family member: Secondary | ICD-10-CM

## 2014-05-04 DIAGNOSIS — F3289 Other specified depressive episodes: Secondary | ICD-10-CM

## 2014-05-04 LAB — ETHANOL: Alcohol, Ethyl (B): 124 mg/dL — ABNORMAL HIGH (ref 0–9)

## 2014-05-04 MED ORDER — MAGNESIUM HYDROXIDE 400 MG/5ML PO SUSP: 30.0000 mL | Freq: Every day | ORAL | Status: DC | PRN

## 2014-05-04 MED ORDER — LOPERAMIDE HCL 2 MG PO CAPS: 2.0000 mg | ORAL_CAPSULE | ORAL | Status: AC | PRN

## 2014-05-04 MED ORDER — TRAZODONE HCL 50 MG PO TABS: 50.0000 mg | ORAL_TABLET | Freq: Every evening | ORAL | Status: DC | PRN

## 2014-05-04 MED ORDER — LORAZEPAM 1 MG PO TABS
1.0000 mg | ORAL_TABLET | Freq: Four times a day (QID) | ORAL | Status: AC
  Administered 2014-05-04 (×4): 1 mg via ORAL
  Filled 2014-05-04 (×4): qty 1

## 2014-05-04 MED ORDER — ACETAMINOPHEN 325 MG PO TABS: 650.0000 mg | ORAL_TABLET | Freq: Four times a day (QID) | ORAL | Status: DC | PRN

## 2014-05-04 MED ORDER — HYDROXYZINE HCL 25 MG PO TABS
25.0000 mg | ORAL_TABLET | Freq: Four times a day (QID) | ORAL | Status: AC | PRN
  Administered 2014-05-05 – 2014-05-07 (×3): 25 mg via ORAL
  Filled 2014-05-04 (×3): qty 1

## 2014-05-04 MED ORDER — ADULT MULTIVITAMIN W/MINERALS CH
1.0000 | ORAL_TABLET | Freq: Every day | ORAL | Status: DC
  Administered 2014-05-04 – 2014-05-09 (×6): 1 via ORAL
  Filled 2014-05-04 (×10): qty 1

## 2014-05-04 MED ORDER — THIAMINE HCL 100 MG/ML IJ SOLN: 100.0000 mg | Freq: Once | INTRAMUSCULAR | Status: DC

## 2014-05-04 MED ORDER — LORAZEPAM 1 MG PO TABS
1.0000 mg | ORAL_TABLET | Freq: Every day | ORAL | Status: AC
  Administered 2014-05-07: 1 mg via ORAL
  Filled 2014-05-04: qty 1

## 2014-05-04 MED ORDER — LORAZEPAM 1 MG PO TABS
1.0000 mg | ORAL_TABLET | Freq: Two times a day (BID) | ORAL | Status: AC
  Administered 2014-05-06 (×2): 1 mg via ORAL
  Filled 2014-05-04 (×2): qty 1

## 2014-05-04 MED ORDER — ALUM & MAG HYDROXIDE-SIMETH 200-200-20 MG/5ML PO SUSP: 30.0000 mL | ORAL | Status: DC | PRN

## 2014-05-04 MED ORDER — BACITRACIN-NEOMYCIN-POLYMYXIN OINTMENT TUBE
TOPICAL_OINTMENT | CUTANEOUS | Status: DC | PRN
  Administered 2014-05-04: 22:00:00 via TOPICAL
  Filled 2014-05-04: qty 1
  Filled 2014-05-04: qty 15

## 2014-05-04 MED ORDER — ALBUTEROL SULFATE HFA 108 (90 BASE) MCG/ACT IN AERS: 2.0000 | INHALATION_SPRAY | Freq: Four times a day (QID) | RESPIRATORY_TRACT | Status: DC | PRN

## 2014-05-04 MED ORDER — VITAMIN B-1 100 MG PO TABS
100.0000 mg | ORAL_TABLET | Freq: Every day | ORAL | Status: DC
  Administered 2014-05-05 – 2014-05-09 (×5): 100 mg via ORAL
  Filled 2014-05-04 (×8): qty 1

## 2014-05-04 MED ORDER — ENSURE COMPLETE PO LIQD
237.0000 mL | Freq: Two times a day (BID) | ORAL | Status: DC
  Administered 2014-05-04 – 2014-05-08 (×9): 237 mL via ORAL

## 2014-05-04 MED ORDER — LORAZEPAM 1 MG PO TABS
1.0000 mg | ORAL_TABLET | Freq: Once | ORAL | Status: AC
  Administered 2014-05-04: 1 mg via ORAL
  Filled 2014-05-04: qty 1

## 2014-05-04 MED ORDER — LORAZEPAM 1 MG PO TABS: 1.0000 mg | ORAL_TABLET | Freq: Four times a day (QID) | ORAL | Status: AC | PRN

## 2014-05-04 MED ORDER — ONDANSETRON 4 MG PO TBDP: 4.0000 mg | ORAL_TABLET | Freq: Four times a day (QID) | ORAL | Status: AC | PRN

## 2014-05-04 MED ORDER — LORAZEPAM 1 MG PO TABS
1.0000 mg | ORAL_TABLET | Freq: Three times a day (TID) | ORAL | Status: AC
  Administered 2014-05-05 (×3): 1 mg via ORAL
  Filled 2014-05-04 (×3): qty 1

## 2014-05-04 MED ORDER — MIRTAZAPINE 7.5 MG PO TABS
7.5000 mg | ORAL_TABLET | Freq: Every day | ORAL | Status: DC
  Administered 2014-05-04: 7.5 mg via ORAL
  Filled 2014-05-04 (×3): qty 1

## 2014-05-04 NOTE — BHH Counselor (Signed)
TTS Counselor spoke with Kaylee Ochoa [facility]) about pt's need for re-assessment since pt was unable to participate in assessment earlier in the evening due to intoxication. TTS Counselor will initiate assessment shortly.  Counselor will also complete support paperwork with pt since she has been accepted to [facility], Bed 501[address]2. Attending physician will be Dr. Sabra Heck.    Ramond Dial, [facility] Triage Specialist

## 2014-05-04 NOTE — Clinical Social Work Note (Signed)
Per patient request, patient referral faxed to [facility] for review.  Tilden Fossa, MSW, [place] Worker [facility] [phone]
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
## 2014-05-04 NOTE — ED Notes (Signed)
Pt states that she feels SOB and that when this happens she drinks alcohol. Pt sats normal. SPoke with Dr. Randal Buba and gave verbal order for 1mg  ativan.

## 2014-05-04 NOTE — Progress Notes (Signed)
D: Patient presents with depressed affect and anxious mood. Writer observed that the patient has been resting all day in her room; she's been having some tremors and mild anxiety. Currently using a wheelchair due to unsteady gait. Patient reported not having much of an appetite; Ensures ordered for the patient. Patient adheres to the medication regimen.  A: Support and encouragement provided to patient. Administered medications per ordering MD. Monitor Q15 minute checks for safety.  R: Patient receptive. Denies SI/HI and AVH. Patient remains safe on the unit.

## 2014-05-04 NOTE — BHH Group Notes (Signed)
   [facility] LCSW Aftercare Discharge Planning Group Note  05/04/2014  8:45 AM   Participation Quality: Alert, Appropriate and Oriented  Mood/Affect: Depressed and Flat  Depression Rating: 10  Anxiety Rating: 10  Thoughts of Suicide: Pt denies SI/HI  Will you contract for safety? Yes  Current AVH: Pt denies  Plan for Discharge/Comments: Pt attended discharge planning group and actively participated in group. CSW provided pt with today's workbook. Patient reports feeling "terrible" today. She is endorsing high levels of depression and anxiety. She reports being hospitalized for alcohol abuse and depression. She reports drinking 1/5th of liquor daily prior to hospitalization. She reports that her fiance recently died on 20-May-2022 related to alcohol abuse. She lives in a homeless housing program in [place] and is interested in residential treatment at [facility] prior to discharge.   Transportation Means: Pt reports access to transportation  Supports: Patient mentioned having several friends who are supportive.  Tilden Fossa, MSW, [employer] Worker [facility] [phone]

## 2014-05-04 NOTE — Progress Notes (Signed)
49 y/o female who presents voluntarily for alcohol detox.  Patient states she drink 1 quart to a 5th of vodka daily.  Patient states she has been drinking for 30 years.  Patient states in her mother died in 08/10/2012 and in 95 her father and sister passed away.  Patient states most recently her fiance passed away 25-May-2022.  Patient states her depression has increased and her drinking has gotten worse.  Patient states she is currently all alone and states she does not know how she will make it.  Patient denies SI/HI and denies AVH.  Patient states she has not been able to hold down a job due to her showing up drunk or being to drunk to go to work. Patient states she is ready to get her life together and stop drinking because she does not want to die.  Patient had no belongings to secure in a locker.  Consents obtained, fall safety plan explained and patient verbalized understanding.  Patient escorted and oriented to the unit.  Patient offered no additional questions or concerns.
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
## 2014-05-04 NOTE — BHH Group Notes (Signed)
[place] LCSW Group Therapy 05/04/2014  1:15 PM Type of Therapy: Group Therapy Participation Level: Active  Participation Quality: Attentive, Sharing and Supportive  Affect: Depressed and flat  Cognitive: Alert and Oriented  Insight: Developing/Improving and Engaged  Engagement in Therapy: Developing/Improving and Engaged  Modes of Intervention: Clarification, Confrontation, Discussion, Education, Exploration, Limit-setting, Orientation, Problem-solving, Rapport Building, Art therapist, Socialization and Support  Summary of Progress/Problems: The topic for group today was emotional regulation. This group focused on both positive and negative emotion identification and allowed group members to process ways to identify feelings, regulate negative emotions, and find healthy ways to manage internal/external emotions. Group members were asked to reflect on a time when their reaction to an emotion led to a negative outcome and explored how alternative responses using emotion regulation would have benefited them. Group members were also asked to discuss a time when emotion regulation was utilized when a negative emotion was experienced. Patient discussed her need to manage her loneliness. CSW and patient explored ways in which patient can increase her support system when she returns home. Patient discussed losing multiple family members and self-medicating with alcohol. CSW and other group members provided patient with emotional support and encouragement.   Tilden Fossa, MSW, [place] Worker [facility] [phone]

## 2014-05-04 NOTE — ED Notes (Signed)
TTS at bedside. 

## 2014-05-04 NOTE — Progress Notes (Signed)
NUTRITION ASSESSMENT  Pt identified as at risk on the Malnutrition Screen Tool  INTERVENTION: 1. Educated patient on the importance of nutrition and encouraged intake of food and beverages. 2. Discussed weight goals. 3. Supplements: Continue Ensure Complete po BID, each supplement provides 350 kcal and 13 grams of protein  NUTRITION DIAGNOSIS: Unintentional weight loss related to sub-optimal intake as evidenced by pt report.   Goal: Pt to meet >/= 90% of their estimated nutrition needs.  Monitor:  PO intake  Assessment:  Pt admitted with alcohol abuse and withdrawal.  Pt states appetite is "terrible", reports only having bacon for breakfast. Pt reports 30 lb weight loss over last 3 months. UBW of 140 lb.   Pt receiving Ensure supplements BID, likes them and is drinking them.  Discussed with pt the importance of eating 3 meals a day with snacks, emphasizing protein consumption. Discussed the importance of good nutrition for mental health and aiding in recovery.   Height: Ht Readings from Last 1 Encounters:  05/04/14 5\' 2"  (1.575 m)    Weight: Wt Readings from Last 1 Encounters:  05/04/14 110 lb (49.896 kg)    Weight Hx: Wt Readings from Last 10 Encounters:  05/04/14 110 lb (49.896 kg)  05/20/13 124 lb (56.246 kg)  11/03/12 136 lb (61.689 kg)  05/27/12 118 lb (53.524 kg)  09/24/11 131 lb (59.421 kg)    BMI:  Body mass index is 20.11 kg/(m^2). Pt meets criteria for normal range based on current BMI.  Estimated Nutritional Needs: Kcal: 30-35 kcal/kg Protein: > 1 gram protein/kg Fluid: 1 ml/kcal  Diet Order: Diet regular Pt is also offered choice of unit snacks mid-morning and mid-afternoon.  Pt is eating as desired.   Lab results and medications reviewed.   Clayton Bibles, MS, RD, LDN Pager: [phone] After Hours Pager: [phone]

## 2014-05-04 NOTE — BHH Counselor (Signed)
Per Patriciaann Clan, PA, pt meets inpt criteria. Per [facility]), pt has been accepted to [facility]. Attending physician is Dr. Sabra Heck. TTS Counselor informed pt of disposition, as well as pt's attending RN Larene Beach).    Ramond Dial, [facility] Triage Specialist

## 2014-05-04 NOTE — Tx Team (Signed)
Initial Interdisciplinary Treatment Plan   PATIENT STRESSORS: Health problems Loss of fiance Jan 13th of a heart attack Substance abuse Traumatic event   PATIENT STRENGTHS: Ability for insight Capable of independent living General fund of knowledge Motivation for treatment/growth   PROBLEM LIST: Problem List/Patient Goals Date to be addressed Date deferred Reason deferred Estimated date of resolution  "I want to talk to someone about grief and loss." 05/04/2014     "I want to help with my drinking." 05/04/2014                                                DISCHARGE CRITERIA:  Ability to meet basic life and health needs Adequate post-discharge living arrangements Improved stabilization in mood, thinking, and/or behavior Motivation to continue treatment in a less acute level of care Safe-care adequate arrangements made Withdrawal symptoms are absent or subacute and managed without 24-hour nursing intervention  PRELIMINARY DISCHARGE PLAN: Attend aftercare/continuing care group Attend PHP/IOP Attend 12-step recovery group Return to previous living arrangement  PATIENT/FAMIILY INVOLVEMENT: This treatment plan has been presented to and reviewed with the patient, ADISON REIFSTECK.  The patient and family have been given the opportunity to ask questions and make suggestions.  Pricilla Riffle M 05/04/2014, 6:58 AM

## 2014-05-05 DIAGNOSIS — Z634 Disappearance and death of family member: Secondary | ICD-10-CM

## 2014-05-05 LAB — TSH: TSH: 4.505 u[IU]/mL — AB (ref 0.350–4.500)

## 2014-05-05 MED ORDER — TRAZODONE HCL 50 MG PO TABS
50.0000 mg | ORAL_TABLET | Freq: Every evening | ORAL | Status: DC | PRN
  Administered 2014-05-05: 50 mg via ORAL
  Filled 2014-05-05: qty 1

## 2014-05-05 MED ORDER — MIRTAZAPINE 15 MG PO TABS
15.0000 mg | ORAL_TABLET | Freq: Every day | ORAL | Status: DC
  Administered 2014-05-05: 15 mg via ORAL
  Filled 2014-05-05 (×3): qty 1

## 2014-05-05 MED ORDER — NICOTINE 21 MG/24HR TD PT24
21.0000 mg | MEDICATED_PATCH | Freq: Every day | TRANSDERMAL | Status: DC
  Administered 2014-05-05 – 2014-05-09 (×5): 21 mg via TRANSDERMAL
  Filled 2014-05-05 (×8): qty 1

## 2014-05-05 NOTE — Progress Notes (Signed)
Recreation Therapy Notes  Animal-Assisted Activity/Therapy (AAA/T) Program Checklist/Progress Notes Patient Eligibility Criteria Checklist & Daily Group note for Rec Tx Intervention  Date: 01.21.2016 Time: 2:45pm Location: [address]   AAA/T Program Assumption of Risk Form signed by Patient/ or Parent Legal Guardian yes  Patient is free of allergies or sever asthma yes  Patient reports no fear of animals yes  Patient reports no history of cruelty to animals yes  Patient understands his/her participation is voluntary yes  Patient washes hands before animal contact yes  Patient washes hands after animal contact yes  Behavioral Response: Did not attend.   Laureen Ochs Gretchen Weinfeld, LRT/CTRS  Moo Gravley L 05/05/2014 5:06 PM

## 2014-05-05 NOTE — Plan of Care (Signed)
Problem: Ineffective individual coping Goal: STG: Patient will remain free from self harm Outcome: Progressing Patient remains free from self harm. 15 minute checks continued per protocol for patient safety.   Problem: Alteration in mood & ability to function due to Goal: LTG-Pt reports reduction in suicidal thoughts (Patient reports reduction in suicidal thoughts and is able to verbalize a safety plan for whenever patient is feeling suicidal)  Outcome: Progressing Patient denies having any suicidal thoughts today. Goal: LTG-Patient demonstrates decreased signs of withdrawal 1/20: Goal not met: Pt continues to have withdrawal symptoms of anxiety, agitation, and sweating and a CIWA score of a 8. Pt to show decrease withdrawal symptoms prior to d/c.  Tilden Fossa, MSW, [place] Worker [facility] [phone] 05/04/2014 10:07 AM     (Patient demonstrates decreased signs of withdrawal to the point the patient is safe to return home and continue treatment in an outpatient setting)  Outcome: Not Progressing Patient continues to experience increasing signs of withdrawal including "sweats" "shakiness" and "weakness."   Comments:  Delray Alt, RN

## 2014-05-05 NOTE — BHH Group Notes (Signed)
[facility] Group Notes:  (Nursing/MHT/Case Management/Adjunct)  Date:  05/05/2014  Time:  0900am  Type of Therapy:  Nurse Education  Participation Level:  Did Not Attend  Participation Quality:  Did not attend  Affect:  Did not attend  Cognitive:  Did not attend  Insight:  None  Engagement in Group:  Did not attend  Modes of Intervention:  Discussion, Education, Orientation and Support  Summary of Progress/Problems: Patient did not attend group and remained in bed d/t feeling "weak."  Jimmye Norman, [place] A 05/05/2014, 12:41 PM

## 2014-05-05 NOTE — Clinical Social Work Note (Signed)
CSW spoke with Melissa at [facility] who reports that she will review patient's referral and put her on wait list if appropriate. No bed availability expected until the beginning of next week.  Tilden Fossa, MSW, [place] Worker [facility] [phone]

## 2014-05-05 NOTE — BHH Group Notes (Signed)
[facility] LCSW Group Therapy 05/05/2014  1:15 PM   Type of Therapy: Group Therapy  Participation Level: Did Not Attend. Per tech, patient is sleeping and has not been feeling well today.   Tilden Fossa, MSW, [place] Worker [facility] [phone]

## 2014-05-05 NOTE — Progress Notes (Signed)
D: client in bed most of the shift reports sadness d/t death of fiancee and other relatives. Client uses WC when up.  A: Writer introduced self to client, provided support, informed client that Mable Fill will be in to see her tomorrow. Client encouraged to attend group, reviewed medications administered as ordered. R: client is safe on the unit, went down to karaoke and returned with in five minutes stating "it is was too loud"  Client is receptive of seeing chaplin tomorrow.
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
## 2014-05-05 NOTE — Progress Notes (Signed)
D: When asked about her day, pt stated, "not too good". After going over the details pt informed the writer that she started her downward spiral after her divorce. Stated that Feb 2015 she was at [facility] and discharged the [facility] for [facility] days. However, pt stated that immediately after discharge she began using. Pt stated that she won't be able to return there because it costs $300.00.  A:  Support and encouragement was offered. 15 min checks continued for safety.  R: Pt remains safe.

## 2014-05-05 NOTE — Progress Notes (Signed)
[facility] MD Progress Note  05/05/2014 11:58 AM Kaylee Ochoa  MRN:  [id number] Subjective: Patient states "I am depressed ,I just lost my fiance ,what do you think I should feel?' Objective: Patient seen and chart reviewed.Patient discussed with treatment team. Patient with hx of alcohol use disorder ,presents after feeling depressed and wanting detox. Patient continues to be in grief ,following the death of her fiance. Patient reports nightmares which affected her sleep last night. Patient continues to have withdrawal sx ,night sweats ,tremors ,chills. Patient denies SI/HI. Pt is willing to get help for her alcohol abuse. Referrals in progress.   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 Alcohol use disorder, severe, dependence [F10.20] 05/04/2014  . 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 Alcohol withdrawal [F10.239] 05/04/2014  . Alcohol dependence [F10.20] 05/27/2012   Total Time spent with patient: 30 minutes   Past Medical History:  Past Medical History  Diagnosis Date  . Hypertension   . Renal disorder   . Depression   . ETOH abuse     Past Surgical History  Procedure Laterality Date  . Breast surgery    . Cyst removal on breast     Family History: History reviewed. No pertinent family history. Social History:  History  Alcohol Use  . Yes    Comment: heavily daily     History  Drug Use No    History   Social History  . Marital Status: Married    Spouse Name: N/A    Number of Children: N/A  . Years of Education: N/A    Social History Main Topics  . Smoking status: Current Every Day Smoker -- 0.25 packs/day for 10 years    Types: Cigarettes  . Smokeless tobacco: None  . Alcohol Use: Yes     Comment: heavily daily  . Drug Use: No  . Sexual Activity: No   Other Topics Concern  . None   Social History Narrative   Additional History:    Sleep: Fair  Appetite:  Poor   Assessment:   Musculoskeletal: Strength & Muscle Tone: within normal limits Gait & Station: normal Patient leans: N/A   Psychiatric Specialty Exam: Physical Exam  Review of Systems  Constitutional: Positive for chills, malaise/fatigue and diaphoresis.  HENT: Negative.   Eyes: Negative.   Respiratory: Negative.   Cardiovascular: Negative.   Gastrointestinal: Negative.   Genitourinary: Negative.   Musculoskeletal: Positive for myalgias.  Skin: Negative.   Neurological: Positive for tremors.  Psychiatric/Behavioral: Positive for depression and substance abuse. The patient is nervous/anxious.     Blood pressure 131/90, pulse 101, temperature 98.2 F (36.8 C), temperature source Oral, resp. rate 18, height 5\' 2"  (1.575 m), weight 49.896 kg (110 lb), last menstrual period 02/12/2014, SpO2 100 %.Body mass index is 20.11 kg/(m^2).  General Appearance: Disheveled  Eye Sport and exercise psychologist::  Fair  Speech:  Clear and Coherent  Volume:  Normal  Mood:  Anxious and Depressed  Affect:  Tearful  Thought Process:  Goal Directed  Orientation:  Full (Time, Place, and Person)  Thought Content:  Rumination  Suicidal Thoughts:  No  Homicidal Thoughts:  No  Memory:  Immediate;  Fair Recent;   Fair Remote;   Fair  Judgement:  Poor  Insight:  Present  Psychomotor Activity:  Tremor  Concentration:  Poor  Recall:  [employer] of Knowledge:Fair  Language: Fair  Akathisia:  No  Handed:  Right  AIMS (if indicated):     Assets:  Communication Skills Desire for Improvement  ADL's:  Intact  Cognition: WNL  Sleep:  Number of Hours: 6.75      Current Medications: Current Facility-Administered Medications  Medication Dose Route Frequency Provider Last Rate Last Dose  . acetaminophen (TYLENOL) tablet 650 mg  650 mg Oral Q6H PRN Laverle Hobby, PA-C      . albuterol (PROVENTIL HFA;VENTOLIN HFA) 108 (90 BASE) MCG/ACT inhaler 2 puff  2 puff Inhalation Q6H PRN Laverle Hobby, PA-C      . alum & mag hydroxide-simeth (MAALOX/MYLANTA) 200-200-20 MG/5ML suspension 30 mL  30 mL Oral Q4H PRN Laverle Hobby, PA-C      . feeding supplement (ENSURE COMPLETE) (ENSURE COMPLETE) liquid 237 mL  237 mL Oral BID BM Nicholaus Bloom, MD   237 mL at 05/05/14 1024  . hydrOXYzine (ATARAX/VISTARIL) tablet 25 mg  25 mg Oral Q6H PRN Laverle Hobby, PA-C      . loperamide (IMODIUM) capsule 2-4 mg  2-4 mg Oral PRN Laverle Hobby, PA-C      . LORazepam (ATIVAN) tablet 1 mg  1 mg Oral Q6H PRN Laverle Hobby, PA-C      . LORazepam (ATIVAN) tablet 1 mg  1 mg Oral TID Laverle Hobby, PA-C   1 mg at 05/05/14 0844   Followed by  . [START ON 05/06/2014] LORazepam (ATIVAN) tablet 1 mg  1 mg Oral BID Laverle Hobby, PA-C       Followed by  . [START ON 05/07/2014] LORazepam (ATIVAN) tablet 1 mg  1 mg Oral Daily Spencer E Simon, PA-C      . magnesium hydroxide (MILK OF MAGNESIA) suspension 30 mL  30 mL Oral Daily PRN Laverle Hobby, PA-C      . mirtazapine (REMERON) tablet 15 mg  15 mg Oral QHS Delainy Mcelhiney, MD      . multivitamin with minerals tablet 1 tablet  1 tablet Oral Daily Laverle Hobby, PA-C   1 tablet at 05/05/14 [phone]  . neomycin-bacitracin-polymyxin (NEOSPORIN) ointment   Topical PRN Laverle Hobby, PA-C      . ondansetron (ZOFRAN-ODT) disintegrating tablet 4 mg  4 mg Oral Q6H PRN Laverle Hobby, PA-C      . thiamine (B-1) injection 100 mg  100 mg Intramuscular Once Laverle Hobby, PA-C   100 mg at 05/04/14 0630  . thiamine (VITAMIN B-1) tablet 100 mg  100 mg Oral Daily Laverle Hobby, PA-C   100 mg at 05/05/14 0737  . traZODone (DESYREL)  tablet 50 mg  50 mg Oral QHS PRN Ursula Alert, MD        Lab Results:  Results for orders placed or performed during the hospital encounter of 05/04/14 (from the past 48 hour(s))  TSH     Status: Abnormal   Collection Time: 05/05/14  7:20 AM  Result Value Ref Range   TSH 4.505 (H) 0.350 - 4.500 uIU/mL    Comment: Performed at [facility]    Physical Findings: AIMS: Facial and Oral Movements Muscles of Facial Expression: None, normal Lips and Perioral Area: None, normal Jaw: None, normal Tongue: None, normal,Extremity Movements  Upper (arms, wrists, hands, fingers): None, normal Lower (legs, knees, ankles, toes): None, normal, Trunk Movements Neck, shoulders, hips: None, normal, Overall Severity Severity of abnormal movements (highest score from questions above): None, normal Incapacitation due to abnormal movements: None, normal Patient's awareness of abnormal movements (rate only patient's report): No Awareness, Dental Status Current problems with teeth and/or dentures?: No Does patient usually wear dentures?: No  CIWA:  CIWA-Ar Total: 2 COWS:     Treatment Plan Summary: Daily contact with patient to assess and evaluate symptoms and progress in treatment and Medication management Will increase Remeron 15 mg po qhs for sleep,appetite as well as depression. Will continue CIWA/Ativan protocol. Will continue Trazodone 50 mg po qhs prn for sleep. Patient to be referred to substance abuse program once stable. CSW will work on disposition. Patient to be encouraged to attend group activities. Reviewed labs - TSH elevated ,will order T3,T4.        Medical Decision Making:  Established Problem, Stable/Improving (1), New problem, with additional work up planned, Review of Psycho-Social Stressors (1), Review or order medicine tests (1), Review of Medication Regimen & Side Effects (2) and Review of New Medication or Change in Dosage (2) Problem Points:  Established problem,  stable/improving (1), New problem, with additional work-up planned (4), Review of last therapy session (1) and Review of psycho-social stressors (1) Data Points:  Review or order medicine tests (1) Review and summation of old records (2) Review of medication regiment & side effects (2) Review of new medications or change in dosage (2)    Erek Kowal md 05/05/2014, 11:58 AM

## 2014-05-05 NOTE — Clinical Social Work Note (Signed)
Patient information faxed to [place] for review.  Tilden Fossa, MSW, [place] Worker [facility] [phone]

## 2014-05-05 NOTE — Progress Notes (Addendum)
D: Patient is alert and oriented. Pt's mood and affect is depressed, blunted, and sad. Pt's eye contact is brief. Pt complains of "shakiness," "blah" feeling, and "sweatiness." Pt reports her depression and hopelessness 10/10 and anxiety 9/10. Pt reports her goal for the day is to "sleep, eat, phone calls." Pt complains of weakness around 1220 today, pt reports she did not eat breakfast. Pt denies SI/HI and AVH at this time. Pt is not attending groups d/t physical state of feeling "weak." Patient requesting nicotine patch. Pt remains in bed resting the majority of the day. Pt observed to brighten in mood this evening and went down to cafeteria for meal. A: Pt encouraged to eat lunch. Pt given and encouraged to increase fluids. Nicotine patch order placed and acknowledged. Order for spiritual consult acknowledged, Matt, chaplin contacted by RN, pt sleeping upon [place] arrival; Mable Fill will return tomorrow. Encouragement/Support provided to pt. Scheduled medications administered per providers orders (See MAR). 15 minute checks continued per protocol for patient safety.  R: Patient cooperative and receptive to nursing interventions.

## 2014-05-06 LAB — T4, FREE: Free T4: 0.82 ng/dL (ref 0.80–1.80)

## 2014-05-06 MED ORDER — MIRTAZAPINE 30 MG PO TABS
30.0000 mg | ORAL_TABLET | Freq: Every day | ORAL | Status: DC
  Administered 2014-05-06 – 2014-05-08 (×3): 30 mg via ORAL
  Filled 2014-05-06 (×4): qty 1

## 2014-05-06 MED ORDER — DOXEPIN HCL 10 MG PO CAPS
10.0000 mg | ORAL_CAPSULE | Freq: Every day | ORAL | Status: DC
  Administered 2014-05-06 – 2014-05-08 (×3): 10 mg via ORAL
  Filled 2014-05-06 (×5): qty 1

## 2014-05-06 MED ORDER — DOXEPIN HCL 10 MG PO CAPS: 10.0000 mg | ORAL_CAPSULE | Freq: Every evening | ORAL | Status: DC | PRN

## 2014-05-06 NOTE — Plan of Care (Signed)
Problem: Alteration in mood Goal: STG-Pt Able to Identify Plan For Continuing Care at D/C Pt. Will be able to identify a plan for continuing care at discharge  Outcome: Progressing Client reports she will be going home with a friend and wants to discuss plans with SW.

## 2014-05-06 NOTE — Plan of Care (Signed)
Problem: Alteration in mood Goal: STG-Pt Able to Identify Plan For Continuing Care at D/C Pt. Will be able to identify a plan for continuing care at discharge  Outcome: Progressing Client reports she will be going to stay with GF, upon discharge.

## 2014-05-06 NOTE — Progress Notes (Signed)
Pls note that TSH was abnormal. @ 4.505.  T3 and T4 free ordered 05/05/2014.  Pending results.  Called lab to verify.  Notified that blood work was sent out to outside facility.  Plan to contact hospitalist for further advise if results come back abnormal.

## 2014-05-06 NOTE — Clinical Social Work Note (Signed)
CSW spoke w friend Monico Hoar at patient's request.  Per Myrna Blazer, pt has had multiple losses (fiance died when detoxing).  Friend has patient's car keys, coat, possessions.  Says patient has 4 children she has "signed away", sounded hopeless to friend.  Will help w transportation, friend will involve associates from 12 step group to assist w patient transition.  Informed friend that patient must arrive between 58 - noon on Monday.  Edwyna Shell, LCSW Clinical Social Worker

## 2014-05-06 NOTE — Progress Notes (Signed)
D: Patient is alert and oriented. Pt's mood and affect is depressed and blunted but pleasant upon interaction with RN. Pt's eye contact is fair. Pt rates her depression and hopelessness 10/10 and anxiety 8/10. Pt reports her goal for the day is "finding after treatment, taking care of personal items such as bills, sleep, class, have social worker talk to [place] (pt's friend)." Pt denies SI/HI and AVH at this time. Pt is requesting to speak with a social worker one on one today about her discharge plan. Pt reports she did not sleep well last night. Pt remains in bed resting the majority of the morning. Pt did not attend morning group. Pt reports that she no longer needs wheelchair. Pt reports this afternoon that she is happy she is "feeling better." A: LCSW, Webb Silversmith notified of pt's request and met with pt. Pt encourage to get out of bed and rest lest during the day in hopes of sleeping better at night. Wheelchair order d/c'd by NP May and wheelchair removed from bedside. Encouragement/Support provided to pt. Scheduled medications administered per providers orders (See MAR). 15 minute checks continued per protocol for patient safety.  R: Patient cooperative and receptive to nursing interventions. Pt remains safe.

## 2014-05-06 NOTE — Progress Notes (Signed)
[facility] MD Progress Note  05/06/2014 10:41 AM Kaylee Ochoa  MRN:  [id number] Subjective: Patient states "I am depressed ,I did not sleep well last night because of the trazodone ,it gave me nightmares.' Objective: Patient seen and chart reviewed.Patient discussed with treatment team. Patient with hx of alcohol use disorder ,presents after feeling depressed and wanting detox. Patient continues to be in grief ,following the death of her fiance. Patient reports nightmares again last night. Patient appears to be slowed ,withdrawn ,anxious ,affect is anxious and tearful. Patient continues to have withdrawal sx from alcohol. Discussed with patient that her mood sx are also likely due to withdrawal from alcohol. Encouraged to take medications and attend group activities. Patient denies SI/HI. Pt is willing to get help for her alcohol abuse. Referrals in progress.   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 Bereavement [Z63.4] 05/05/2014  . Alcohol use disorder, severe, dependence [F10.20] 05/04/2014  . 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 Alcohol withdrawal [F10.239] 05/04/2014  . Alcohol dependence [F10.20] 05/27/2012   Total Time spent with patient: 30 minutes   Past Medical History:  Past Medical History  Diagnosis Date  . Hypertension   . Renal disorder   . Depression   . ETOH abuse     Past Surgical History  Procedure Laterality Date  . Breast surgery    . Cyst removal on breast     Family History: History reviewed. No pertinent family  history. Social History:  History  Alcohol Use  . Yes    Comment: heavily daily     History  Drug Use No    History   Social History  . Marital Status: Married    Spouse Name: N/A    Number of Children: N/A  . Years of Education: N/A   Social History Main Topics  . Smoking status: Current Every Day Smoker -- 0.25 packs/day for 10 years    Types: Cigarettes  . Smokeless tobacco: None  . Alcohol Use: Yes     Comment: heavily daily  . Drug Use: No  . Sexual Activity: No   Other Topics Concern  . None   Social History Narrative   Additional History:    Sleep: Fair  Appetite:  Poor   Assessment:   Musculoskeletal: Strength & Muscle Tone: within normal limits Gait & Station: normal Patient leans: N/A   Psychiatric Specialty Exam: Physical Exam  Review of Systems  Constitutional: Positive for chills and malaise/fatigue.  Musculoskeletal: Positive for myalgias.  Psychiatric/Behavioral: Positive for depression and substance abuse. The patient is nervous/anxious and has insomnia.     Blood pressure 129/97, pulse 98, temperature 98.7 F (37.1 C), temperature source Oral, resp. rate 18, height 5\' 2"  (1.575 m), weight 49.896 kg (110 lb), last menstrual period 02/12/2014, SpO2 100 %.Body mass index is 20.11 kg/(m^2).  General Appearance: Disheveled  Eye Sport and exercise psychologist::  Fair  Speech:  Clear and Coherent  Volume:  Normal  Mood:  Anxious and Depressed  Affect:  Tearful  Thought Process:  Goal Directed  Orientation:  Full (Time, Place, and Person)  Thought Content:  Rumination  Suicidal Thoughts:  No  Homicidal Thoughts:  No  Memory:  Immediate;   Fair Recent;   Fair Remote;   Fair  Judgement:  Poor  Insight:  Present  Psychomotor Activity:  Tremor  Concentration:  Poor  Recall:  [employer] of Knowledge:Fair  Language: Fair  Akathisia:  No  Handed:  Right  AIMS (if indicated):     Assets:  Communication Skills Desire for Improvement  ADL's:  Intact   Cognition: WNL  Sleep:  Number of Hours: 2.75     Current Medications: Current Facility-Administered Medications  Medication Dose Route Frequency Provider Last Rate Last Dose  . acetaminophen (TYLENOL) tablet 650 mg  650 mg Oral Q6H PRN Laverle Hobby, PA-C      . albuterol (PROVENTIL HFA;VENTOLIN HFA) 108 (90 BASE) MCG/ACT inhaler 2 puff  2 puff Inhalation Q6H PRN Laverle Hobby, PA-C      . alum & mag hydroxide-simeth (MAALOX/MYLANTA) 200-200-20 MG/5ML suspension 30 mL  30 mL Oral Q4H PRN Laverle Hobby, PA-C      . doxepin (SINEQUAN) capsule 10 mg  10 mg Oral QHS PRN Ursula Alert, MD      . feeding supplement (ENSURE COMPLETE) (ENSURE COMPLETE) liquid 237 mL  237 mL Oral BID BM Nicholaus Bloom, MD   237 mL at 05/06/14 7425  . hydrOXYzine (ATARAX/VISTARIL) tablet 25 mg  25 mg Oral Q6H PRN Laverle Hobby, PA-C   25 mg at 05/06/14 0104  . loperamide (IMODIUM) capsule 2-4 mg  2-4 mg Oral PRN Laverle Hobby, PA-C      . LORazepam (ATIVAN) tablet 1 mg  1 mg Oral Q6H PRN Laverle Hobby, PA-C      . LORazepam (ATIVAN) tablet 1 mg  1 mg Oral BID Laverle Hobby, PA-C   1 mg at 05/06/14 0736   Followed by  . [START ON 05/07/2014] LORazepam (ATIVAN) tablet 1 mg  1 mg Oral Daily Spencer E Simon, PA-C      . magnesium hydroxide (MILK OF MAGNESIA) suspension 30 mL  30 mL Oral Daily PRN Laverle Hobby, PA-C      . mirtazapine (REMERON) tablet 30 mg  30 mg Oral QHS Ursula Alert, MD      . multivitamin with minerals tablet 1 tablet  1 tablet Oral Daily Laverle Hobby, PA-C   1 tablet at 05/06/14 9563  . neomycin-bacitracin-polymyxin (NEOSPORIN) ointment   Topical PRN Laverle Hobby, PA-C      . nicotine (NICODERM CQ - dosed in mg/24 hours) patch 21 mg  21 mg Transdermal Daily Ursula Alert, MD   21 mg at 05/06/14 0738  . ondansetron (ZOFRAN-ODT) disintegrating tablet 4 mg  4 mg Oral Q6H PRN Laverle Hobby, PA-C      . thiamine (B-1) injection 100 mg  100 mg Intramuscular Once Laverle Hobby, PA-C   100 mg at 05/04/14 0630  . thiamine (VITAMIN B-1) tablet 100 mg  100 mg Oral Daily Laverle Hobby, PA-C   100 mg at 05/06/14 8756    Lab Results:  Results for orders placed or performed during the hospital encounter of 05/04/14 (from the past 48 hour(s))  TSH     Status: Abnormal   Collection Time: 05/05/14  7:20 AM  Result Value Ref Range   TSH 4.505 (H) 0.350 - 4.500 uIU/mL    Comment: Performed at [facility]    Physical Findings: AIMS: Facial and Oral Movements Muscles of Facial Expression: None, normal Lips and  Perioral Area: None, normal Jaw: None, normal Tongue: None, normal,Extremity Movements Upper (arms, wrists, hands, fingers): None, normal Lower (legs, knees, ankles, toes): None, normal, Trunk Movements Neck, shoulders, hips: None, normal, Overall Severity Severity of abnormal movements (highest score from questions above): None, normal Incapacitation due to abnormal movements: None, normal Patient's awareness of abnormal movements (rate only patient's report): No Awareness, Dental Status Current problems with teeth and/or dentures?: No Does patient usually wear dentures?: No  CIWA:  CIWA-Ar Total: 3 COWS:     Treatment Plan Summary: Daily contact with patient to assess and evaluate symptoms and progress in treatment and Medication management Will increase Remeron 30 mg po qhs for sleep,appetite as well as depression. Will continue CIWA/Ativan protocol. Will DC Trazodone for nightmares. Add Doxepin 10 mg po qhs . If she is ok with it ,could continue it. Reevaluate on Saturday for side effects or nightmares. Patient to be referred to substance abuse program once stable. CSW will work on disposition. Patient to be encouraged to attend group activities. Reviewed labs - TSH elevated ,will order T3,T4- will order hospitalist consult as needed ,once results are reviewed .       Medical Decision Making:  New problem, with additional work up  planned, Review of Psycho-Social Stressors (1), Established Problem, Worsening (2), Review or order medicine tests (1), Review of Medication Regimen & Side Effects (2) and Review of New Medication or Change in Dosage (2) Problem Points:  Established problem, worsening (2), New problem, with additional work-up planned (4), Review of last therapy session (1) and Review of psycho-social stressors (1) Data Points:  Review or order medicine tests (1) Review and summation of old records (2) Review of medication regiment & side effects (2) Review of new medications or change in dosage (2)    Zayden Hahne md 05/06/2014, 10:41 AM

## 2014-05-06 NOTE — [facility] Suicide Risk Assessment (Signed)
[name] INPATIENT:  Family/Significant Other Suicide Prevention Education  Suicide Prevention Education:  Education Completed; Kaylee Ochoa Ochoa [phone])  has been identified by the patient as the family member/significant other with whom the patient will be residing, and identified as the person(s) who will aid the patient in the event of a mental health crisis (suicidal ideations/suicide attempt).  With written consent from the patient, the family member/significant other has been provided the following suicide prevention education, prior to the and/or following the discharge of the patient.  The suicide prevention education provided includes the following:  Suicide risk factors  Suicide prevention and interventions  National Suicide Hotline telephone number  [facility] assessment telephone number  [facility] Emergency Assistance Kaylee Ochoa Ochoa and/or Residential Mobile Crisis Unit telephone number  Request made of family/significant other to:  Remove weapons (e.g., guns, rifles, knives), all items previously/currently identified as safety concern.    Remove drugs/medications (over-the-counter, prescriptions, illicit drugs), all items previously/currently identified as a safety concern.  The family member/significant other verbalizes understanding of the suicide prevention education information provided.  The family member/significant other agrees to remove the items of safety concern listed above.  Kaylee Ochoa Ochoa Ochoa, Kaylee Ochoa Kaylee Ochoa Ochoa C 05/06/2014, 1:35 PM

## 2014-05-06 NOTE — Clinical Social Work Note (Signed)
[facility] auth # [id number] given by Vaughan Basta - transmitted to [facility]/Fumi on VM.  Per [facility], call Francine/[facility] [phone]) w any problems.  Edwyna Shell, LCSW Clinical Social Worker

## 2014-05-06 NOTE — Plan of Care (Signed)
Problem: Ineffective individual coping Goal: STG: Patient will remain free from self harm Outcome: Progressing Patient remains free from self harm. 15 minute checks continued per protocol for patient safety.   Problem: Alteration in mood & ability to function due to Goal: LTG-Pt reports reduction in suicidal thoughts (Patient reports reduction in suicidal thoughts and is able to verbalize a safety plan for whenever patient is feeling suicidal)  Outcome: Progressing Patient denies having any suicidal thoughts today. Goal: LTG-Patient demonstrates decreased signs of withdrawal 1/20: Goal not met: Pt continues to have withdrawal symptoms of anxiety, agitation, and sweating and a CIWA score of a 8. Pt to show decrease withdrawal symptoms prior to d/c.  Tilden Fossa, MSW, [place] Worker [facility] [phone] 05/04/2014 10:07 AM     (Patient demonstrates decreased signs of withdrawal to the point the patient is safe to return home and continue treatment in an outpatient setting)  Outcome: Progressing Patient reports that she is feeling better today and that her withdrawal symptoms have decreased.  Problem: Alteration in mood Goal: STG-Pt Able to Identify Plan For Continuing Care at D/C Pt. Will be able to identify a plan for continuing care at discharge  Outcome: Progressing Patient has been actively working on her discharge plan today and requested to speak with social worker, whom was notified and met one on one with pt.

## 2014-05-06 NOTE — Clinical Social Work Note (Addendum)
Fumi from [facility] states they can admit patient today or Monday, [facility] auth # needed.  CSW [place] informed.  Edwyna Shell, LCSW Clinical Social Worker

## 2014-05-06 NOTE — Progress Notes (Signed)
D: Client affect brighter today, although she reports concerns about discharge plans. "have you heard of it, [place]" "I talked to a few people and they say it's terrible" "they say that they make you go to your room and you can't communicate with people" "they say it's horrible" "I can't handle that situation" "I'm going to see if they will sent me somewhere else" "client rates depression at "10" of 10. "I'm depressed about my fiance' that died last week" "I like to sleep cause he talks to me in my sleep" A: Writer provides emotional support, encourages client to try to stay up during the day, so she can sleep during the night. Writer encouraged group and interaction. Staff will monitor q23min for safety. R: client is safe on the unit, attended group.

## 2014-05-07 DIAGNOSIS — F1994 Other psychoactive substance use, unspecified with psychoactive substance-induced mood disorder: Secondary | ICD-10-CM

## 2014-05-07 DIAGNOSIS — F10239 Alcohol dependence with withdrawal, unspecified: Secondary | ICD-10-CM

## 2014-05-07 DIAGNOSIS — Z634 Disappearance and death of family member: Secondary | ICD-10-CM

## 2014-05-07 LAB — T3: T3, Total: 123 ng/dL (ref 71–180)

## 2014-05-07 MED ORDER — LOPERAMIDE HCL 2 MG PO CAPS: 2.0000 mg | ORAL_CAPSULE | ORAL | Status: DC | PRN

## 2014-05-07 MED ORDER — HYDROXYZINE HCL 25 MG PO TABS
25.0000 mg | ORAL_TABLET | Freq: Four times a day (QID) | ORAL | Status: DC | PRN
  Administered 2014-05-07 – 2014-05-08 (×2): 25 mg via ORAL
  Filled 2014-05-07 (×2): qty 1

## 2014-05-07 NOTE — Progress Notes (Signed)
D)  Has been out on the hall more this evening and attended group.  Interacting appropriately with staff and peers, asking appropriate questions, focusing on discharge at this time.  Stated had been told she could be discharged to go to [facility] for long term, but has heard negative things and would like to go somewhere else, but doesn't have insurance.  Has been pleasant, cooperative, states feeling better, but states having night sweats, still occasional diarrhea and anxiety, but thinks most of it is related to her fiance dying last week.   A)  Encouraged her to discuss other possibilities with her CM,support, contracts for safety, will continue to monitor for safety, R)  Safety maintained.

## 2014-05-07 NOTE — Progress Notes (Signed)
[facility] MD Progress Note  05/07/2014 12:30 PM Kaylee Ochoa  MRN:  [id number]   Subjective: I like new medication.  I still have nightmares but they're less intense from the past.    Objective: Patient seen and chart reviewed.  Patient continued to endorse severe depression and crying spells.  She is taking doxepin at bedtime.  She mentioned it is better than trazodone.  She endorse continued to have flashback and dream about her fianc who deceased.  She has tremors and shakes but it is getting better.  She admitted having crying spells and sometime feeling hopeless and helpless.  Her medications were increased yesterday.  She is tolerating well.  She is going to the groups however admitted not able to participate because she used to cry.  She is hoping that medicine will help her.  She does not want drugs and alcohol in her life in the future.  She wants to get treatment and help for her alcohol abuse.  Her free T3 and T4 came normal.  Patient mentioned her physical symptoms are much improved and she no longer have any mylagia, fever-like feeling or any body aches.  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 Bereavement [Z63.4] 05/05/2014  . Alcohol use disorder, severe, dependence [F10.20] 05/04/2014  . 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 Alcohol withdrawal [F10.239] 05/04/2014  . Alcohol dependence [F10.20] 05/27/2012   Total Time spent with patient: 30 minutes   Past Medical History:  Past Medical History  Diagnosis Date  . Hypertension   .  Renal disorder   . Depression   . ETOH abuse     Past Surgical History  Procedure Laterality Date  . Breast surgery    . Cyst removal on breast     Family History: History reviewed. No pertinent family history. Social History:  History  Alcohol Use  . Yes    Comment: heavily daily     History  Drug Use No    History   Social History  . Marital Status: Married    Spouse Name: N/A    Number of Children: N/A  . Years of Education: N/A   Social History Main Topics  . Smoking status: Current Every Day Smoker -- 0.25 packs/day for 10 years    Types: Cigarettes  . Smokeless tobacco: None  . Alcohol Use: Yes     Comment: heavily daily  . Drug Use: No  . Sexual Activity: No   Other Topics Concern  . None   Social History Narrative   Additional History:    Sleep: Fair  Appetite:  Poor   Assessment:   Musculoskeletal: Strength & Muscle Tone: within normal limits Gait & Station: normal Patient leans: N/A   Psychiatric Specialty Exam: Physical Exam  Review of Systems  Psychiatric/Behavioral: Positive for depression. The patient is nervous/anxious and has insomnia.     Blood pressure 117/83, pulse 110, temperature 98.1 F (36.7 C), temperature source Oral, resp. rate 16, height 5\' 2"  (1.575 m), weight 49.896 kg (110 lb), last menstrual period 02/12/2014, SpO2 100 %.Body mass index is 20.11 kg/(m^2).  General Appearance: Lawyer::  Fair  Speech:  Clear and Coherent  Volume:  Normal  Mood:  Anxious and Depressed  Affect:  Tearful  Thought Process:  Goal Directed  Orientation:  Full (Time, Place, and Person)  Thought Content:  Rumination  Suicidal Thoughts:  No  Homicidal Thoughts:  No  Memory:  Immediate;   Fair Recent;   Fair Remote;   Fair  Judgement:  Poor  Insight:  Present  Psychomotor Activity:  Tremor  Concentration:  Poor  Recall:  [employer] of Knowledge:Fair  Language: Fair  Akathisia:  No  Handed:  Right  AIMS (if  indicated):     Assets:  Communication Skills Desire for Improvement  ADL's:  Intact  Cognition: WNL  Sleep:  Number of Hours: 2.75     Current Medications: Current Facility-Administered Medications  Medication Dose Route Frequency Provider Last Rate Last Dose  . acetaminophen (TYLENOL) tablet 650 mg  650 mg Oral Q6H PRN Laverle Hobby, PA-C      . albuterol (PROVENTIL HFA;VENTOLIN HFA) 108 (90 BASE) MCG/ACT inhaler 2 puff  2 puff Inhalation Q6H PRN Laverle Hobby, PA-C      . alum & mag hydroxide-simeth (MAALOX/MYLANTA) 200-200-20 MG/5ML suspension 30 mL  30 mL Oral Q4H PRN Laverle Hobby, PA-C      . doxepin (SINEQUAN) capsule 10 mg  10 mg Oral QHS Ursula Alert, MD   10 mg at 05/06/14 2145  . feeding supplement (ENSURE COMPLETE) (ENSURE COMPLETE) liquid 237 mL  237 mL Oral BID BM Nicholaus Bloom, MD   237 mL at 05/07/14 1157  . magnesium hydroxide (MILK OF MAGNESIA) suspension 30 mL  30 mL Oral Daily PRN Laverle Hobby, PA-C      . mirtazapine (REMERON) tablet 30 mg  30 mg Oral QHS Ursula Alert, MD   30 mg at 05/06/14 2145  . multivitamin with minerals tablet 1 tablet  1 tablet Oral Daily Laverle Hobby, PA-C   1 tablet at 05/07/14 1157  . neomycin-bacitracin-polymyxin (NEOSPORIN) ointment   Topical PRN Laverle Hobby, PA-C      . nicotine (NICODERM CQ - dosed in mg/24 hours) patch 21 mg  21 mg Transdermal Daily Saramma Eappen, MD   21 mg at 05/07/14 1157  . thiamine (B-1) injection 100 mg  100 mg Intramuscular Once Laverle Hobby, PA-C   100 mg at 05/04/14 0630  . thiamine (VITAMIN B-1) tablet 100 mg  100 mg Oral Daily Laverle Hobby, PA-C   100 mg at 05/07/14 1157    Lab Results:  Results for orders placed or performed during the hospital encounter of 05/04/14 (from the past 48 hour(s))  T4, free     Status: None   Collection Time: 05/06/14  7:55 AM  Result Value Ref Range   Free T4 0.82 0.80 - 1.80 ng/dL    Comment: Performed at [facility]  T3     Status:  None   Collection Time: 05/06/14  7:55 AM  Result Value Ref Range   T3, Total 123 71 - 180 ng/dL    Comment: (NOTE) Performed At: [facility] [place], [place] [id number] Lindon Romp MD [id number] Performed at [facility]     Physical Findings: AIMS: Facial and Oral Movements Muscles of Facial Expression: None, normal Lips and Perioral Area: None, normal Jaw: None, normal Tongue: None, normal,Extremity Movements Upper (arms, wrists, hands, fingers): None, normal Lower (legs, knees, ankles, toes): None, normal, Trunk  Movements Neck, shoulders, hips: None, normal, Overall Severity Severity of abnormal movements (highest score from questions above): None, normal Incapacitation due to abnormal movements: None, normal Patient's awareness of abnormal movements (rate only patient's report): No Awareness, Dental Status Current problems with teeth and/or dentures?: No Does patient usually wear dentures?: No  CIWA:  CIWA-Ar Total: 0 COWS:     Treatment Plan Summary: Daily contact with patient to assess and evaluate symptoms and progress in treatment and Medication management Will continue  Remeron 30 mg po qhs for sleep,appetite as well as depression. Will continue CIWA/Ativan protocol. Will continue Doxepin 10 mg po qhs .  She is tolerating well and denies any side effects.   Her free T3 and T4 is normal. Patient to be referred to substance abuse program once stable. CSW will work on disposition. Patient to be encouraged to attend group activities.   Medical Decision Making:  Review of Psycho-Social Stressors (1), Review or order clinical lab tests (1), Review or order medicine tests (1) and Review of Medication Regimen & Side Effects (2) Problem Points:  Established problem, worsening (2), New problem, with additional work-up planned (4), Review of last therapy session (1) and Review of psycho-social stressors (1) Data Points:   Review or order medicine tests (1) Review and summation of old records (2) Review of medication regiment & side effects (2) Review of new medications or change in dosage (2)    Elliot Simoneaux T. md 05/07/2014, 12:30 PM

## 2014-05-07 NOTE — Progress Notes (Signed)
[facility] Group Notes:  (Nursing/MHT/Case Management/Adjunct)  Date:  05/07/2014  Time:  12:02 AM  Type of Therapy:  Psychoeducational Skills  Participation Level:  Active  Participation Quality:  Appropriate  Affect:  Excited  Cognitive:  Appropriate  Insight:  Good  Engagement in Group:  Engaged  Modes of Intervention:  Education  Summary of Progress/Problems: The patient attended the Wrap-Up group last evening on the 400 hallway. She expressed in group that she was initially upbeat when she learned that the case manager found placement at a detox center, but she felt differently after talking with her peers. She states that she doesn't like a number of the rules at [facility] and that she needs to be able to have freedom and be able to interact with her peers. She feels that their is an alternative to being sent to [facility] and that she might have a place to live according to her sister. In addition, the patient feels that she should have her pocketbook in her possession along with her checkbook so that she can pay bills. As a theme for the day, her coping skill will be comprised of attending A.A. Meetings.   [name] S 05/07/2014, 12:02 AM

## 2014-05-07 NOTE — Progress Notes (Signed)
Adult Psychoeducational Group Note  Date:  05/07/2014 Time:  9:14 PM  Group Topic/Focus:  Wrap-Up Group:   The focus of this group is to help patients review their daily goal of treatment and discuss progress on daily workbooks.  Participation Level:  Active  Participation Quality:  Appropriate  Affect:  Appropriate  Cognitive:  Appropriate  Insight: Appropriate  Engagement in Group:  Engaged  Modes of Intervention:  Discussion  Additional Comments: The patient expressed that she had a up and down day.The patient also said that getting up this morning was her up but she was down about discharge.  Kaylee Ochoa 05/07/2014, 9:14 PM

## 2014-05-07 NOTE — Progress Notes (Signed)
Patient ID: FLANNERY CAVALLERO, female   DOB: 30-Nov-1965, 49 y.o.   MRN: [id number] Patient shared with weekend CSW staff that she does not wish to discharge to [place] as planned due to reports she has heard from other patients regarding environment there. Patient also shared that she had been referred to [facility] initially by another CSW here at [facility] and she had called [facility] on Friday, 05/06/14, afternoon and was todl admissions staff person Lenna Sciara would be working there today 05/07/14. CSW called admissions at [phone] and voicemail greeting states Lenna Sciara works only Monday thru Friday. Patient informed that she could discharge Monday at [place] to [place] as planned or discharge later in the day on Monday and follow up with [facility] with the knowledge that admit date nor status cannot be guaranteed. Patient agreeable to thinking about choices over nite and speaking with her support person who planned to transport her to [place] as she would need a place to stay at discharge. Importance of some type of follow up treatment discussed as patient knows she needs treatment and maybe a requirement for reestablishing contact with children.  CSW staff to check in with patient tomorrow. Sheilah Pigeon, LCSW

## 2014-05-07 NOTE — BHH Group Notes (Signed)
[facility] LCSW Group Therapy Note  05/07/2014 / 11:15 AM  Type of Therapy and Topic:  Group Therapy: Avoiding Self-Sabotaging and Enabling Behaviors  Participation Level:  Active  Therapeutic Goals: 1. Patient will identify one obstacle that relates to self-sabotage and enabling behaviors 2. Patient will identify one personal self-sabotaging or enabling behavior they did prior to admission 3. Patient able to establish a plan to change the above identified behavior they did prior to admission:  4. Patient will demonstrate ability to communicate their needs through discussion and/or role plays.   Summary of Patient Progress: The main focus of today's process group was to discuss what "self-sabotage" means and use Motivational Interviewing to discuss what benefits, negative or positive, were involved in a self-identified self-sabotaging behavior. We then talked about reasons the patient may want to change the behavior and current desire to change. The patient identified with self sabotaging in the form of substance abuse. She was engaged in group process as evidenced by her comments and processing of recent stressors including death of fiancee and temporary loss of custody of child(ren). Patient was able to process and relate negative effects of substance use to current stressors.     Therapeutic Modalities:   Cognitive Behavioral Therapy Person-Centered Therapy Motivational Interviewing   Sheilah Pigeon, LCSW

## 2014-05-07 NOTE — Progress Notes (Signed)
Patient ID: Kaylee Ochoa, female   DOB: [birth year], 49 y.o.   MRN: [id number]   D: Pt was in the bed most of the day, she did not attend any groups nor did she engage in treatment. Pt reported that she continues to have withdrawals, she was given her Ativan per protocol. Pt has remained very flat and depressed on the unit. Pt reported that her depression was a 10, and that her hopelessness was a 9. Pt reported being negative SI/HI, no AH/VH noted. A: 15 min checks continued for patient safety. R: Pt safety maintained.

## 2014-05-08 MED ORDER — DOXEPIN HCL 10 MG PO CAPS: 10.0000 mg | ORAL_CAPSULE | Freq: Every day | ORAL | Status: DC

## 2014-05-08 MED ORDER — MIRTAZAPINE 30 MG PO TABS: 30.0000 mg | ORAL_TABLET | Freq: Every day | ORAL | Status: DC

## 2014-05-08 MED ORDER — ALBUTEROL SULFATE HFA 108 (90 BASE) MCG/ACT IN AERS: 2.0000 | INHALATION_SPRAY | Freq: Four times a day (QID) | RESPIRATORY_TRACT | Status: AC | PRN

## 2014-05-08 MED ORDER — HYDROXYZINE HCL 25 MG PO TABS: 25.0000 mg | ORAL_TABLET | Freq: Four times a day (QID) | ORAL | Status: DC | PRN

## 2014-05-08 NOTE — Discharge Summary (Signed)
Physician Discharge Summary Note  Patient:  Kaylee Ochoa is an 49 y.o., female MRN:  [id number] DOB:  02/28/1966 Patient phone:  [phone] (home)  Patient address:   [place] [address],  Total Time spent with patient: Greater than 30 minutes  Date of Admission:  05/04/2014 Date of Discharge: 05/09/2014   Reason for Admission:  Kaylee Ochoa is a 49 yo female that was accompanied to the [facility], patient wants to get help with detox. Kaylee Ochoa has underwent multiple detox programs in the past. She states she does not see her children as often due to her drinking problem. She was last at [facility] for detox this past Feb. Kaylee Ochoa currently lives alone and states that her fiance just passed away 1 week ago on 2014/05/06 due to alcoholism-related complications. She is afraid of further complications of alcoholism such as the ones that affected her boyfriend and other immediate family members (mother, father, and sister have all passed away within the last 3 months).   Principal Problem: Alcohol use disorder, severe, dependence Discharge Diagnoses: Patient Active Problem List   Diagnosis Date Noted  . Bereavement [Z63.4] 05/05/2014  . Alcohol use disorder, severe, dependence [F10.20] 05/04/2014  . 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 Alcohol withdrawal [F10.239] 05/04/2014  . Alcohol dependence [F10.20] 05/27/2012    Musculoskeletal: Strength & Muscle Tone: within normal limits Gait & Station: normal Patient leans: N/A  Psychiatric Specialty Exam:  See Suicide Risk Assessment Physical Exam  Review of Systems  Genitourinary: Negative.   Psychiatric/Behavioral: Positive for substance abuse. Negative for suicidal ideas and hallucinations. Depression: stable. Memory loss: Stable. Nervous/anxious: Stable.   All other systems reviewed and are negative.   Blood pressure 126/76, pulse 65, temperature 98.7 F (37.1 C),  temperature source Oral, resp. rate 20, height 5\' 2"  (1.575 m), weight 49.896 kg (110 lb), last menstrual period 02/12/2014, SpO2 100 %.Body mass index is 20.11 kg/(m^2).    Past Medical History:  Past Medical History  Diagnosis Date  . Hypertension   . Renal disorder   . Depression   . ETOH abuse     Past Surgical History  Procedure Laterality Date  . Breast surgery    . Cyst removal on breast     Family History: History reviewed. No pertinent family history. Social History:  History  Alcohol Use  . Yes    Comment: heavily daily     History  Drug Use No    History   Social History  . Marital Status: Married    Spouse Name: N/A    Number of Children: N/A  . Years of Education: N/A   Social History Main Topics  . Smoking status: Current Every Day Smoker -- 0.25 packs/day for 10 years    Types: Cigarettes  . Smokeless tobacco: None  . Alcohol Use: Yes     Comment: heavily daily  . Drug Use: No  . Sexual Activity: No   Other Topics Concern  . None   Social History Narrative    Past Psychiatric History: Hospitalizations:  Outpatient Care:  Substance Abuse Care:  Self-Mutilation:  Suicidal Attempts:  Violent Behaviors:   Risk to Self: Is patient at risk for suicide?: No What has been your use of drugs/alcohol within the last 12 months?: drinking approximately 1/5 of liquor daily prior to admission Risk to Others:   Prior Inpatient Therapy:   Prior Outpatient Therapy:    Level of Care:  [facility]  Hospital Course:    Kaylee Ochoa was admitted for Alcohol use disorder, severe, dependence and crisis management.  She was treated with Vistaril for anxiety, Remeron for depression, Sinequan for insomnia.  Medical problems were identified and treated as needed.  Home medications were restarted as appropriate.  Improvement was monitored by observation and Kaylee Ochoa of symptom reduction.  Emotional and mental status was monitored by daily self  inventory reports completed by Kaylee Ochoa and clinical staff.         Kaylee Ochoa was evaluated by the treatment team for stability and plans for continued recovery upon discharge.  She was offered further treatment options upon discharge including Residential, Intensive Outpatient and Outpatient treatment. He/She will follow up with [facility].   Kaylee Ochoa motivation was an integral factor for scheduling further treatment.  Employment, transportation, bed availability, health status, family support, and any pending legal issues were also considered during her hospital stay.  Upon completion of this admission the patient was both mentally and medically stable for discharge denying suicidal/homicidal ideation, auditory/visual/tactile hallucinations, delusional thoughts and paranoia.       Consults:  psychiatry  Significant Diagnostic Studies:  labs: UDS, ETOH,CBC/Diff, CMET, Urinalysis  Discharge Vitals:   Blood pressure 126/76, pulse 65, temperature 98.7 F (37.1 C), temperature source Oral, resp. rate 20, height 5\' 2"  (1.575 m), weight 49.896 kg (110 lb), last menstrual period 02/12/2014, SpO2 100 %. Body mass index is 20.11 kg/(m^2). Lab Results:   Results for orders placed or performed during the hospital encounter of 05/04/14 (from the past 72 hour(s))  T4, free     Status: None   Collection Time: 05/06/14  7:55 AM  Result Value Ref Range   Free T4 0.82 0.80 - 1.80 ng/dL    Comment: Performed at [facility]  T3     Status: None   Collection Time: 05/06/14  7:55 AM  Result Value Ref Range   T3, Total 123 71 - 180 ng/dL    Comment: (NOTE) Performed At: [facility] [place], [place] [id number] Lindon Romp MD [id number] Performed at [facility]     Physical Findings: AIMS: Facial and Oral Movements Muscles of Facial Expression: None, normal Lips and Perioral Area: None, normal Jaw: None, normal Tongue: None,  normal,Extremity Movements Upper (arms, wrists, hands, fingers): None, normal Lower (legs, knees, ankles, toes): None, normal, Trunk Movements Neck, shoulders, hips: None, normal, Overall Severity Severity of abnormal movements (highest score from questions above): None, normal Incapacitation due to abnormal movements: None, normal Patient's awareness of abnormal movements (rate only patient's Ochoa): No Awareness, Dental Status Current problems with teeth and/or dentures?: No Does patient usually wear dentures?: No  CIWA:  CIWA-Ar Total: 2 COWS:      See Psychiatric Specialty Exam and Suicide Risk Assessment completed by Attending Physician prior to discharge.  Discharge destination:  [facility]  Is patient on multiple antipsychotic therapies at discharge:  No   Has Patient had three or more failed trials of antipsychotic monotherapy by history:  No    Recommended Plan for Multiple Antipsychotic Therapies: NA  Discharge Instructions    Discharge instructions    Complete by:  As directed   Take all of you medications as prescribed by your mental healthcare provider.  Ochoa any adverse effects and reactions from your medications to your outpatient provider promptly. Do not engage in alcohol and or illegal drug use while on prescription  medicines. In the event of worsening symptoms call the crisis hotline, 911, and or go to the nearest emergency department for appropriate evaluation and treatment of symptoms. Follow-up with your primary care provider for your medical issues, concerns and or health care needs.   Keep all scheduled appointments.  If you are unable to keep an appointment call to reschedule.  Let the nurse know if you will need medications before next scheduled appointment.            Medication List    STOP taking these medications        traZODone 50 MG tablet  Commonly known as:  DESYREL      TAKE these medications      Indication   albuterol 108 (90 BASE)  MCG/ACT inhaler  Commonly known as:  PROVENTIL HFA;VENTOLIN HFA  Inhale 2 puffs into the lungs every 6 (six) hours as needed for wheezing or shortness of breath.   Indication:  Asthma     doxepin 10 MG capsule  Commonly known as:  SINEQUAN  Take 1 capsule (10 mg total) by mouth at bedtime. For sleep   Indication:  sleep     hydrOXYzine 25 MG tablet  Commonly known as:  ATARAX/VISTARIL  Take 1 tablet (25 mg total) by mouth every 6 (six) hours as needed for anxiety. For anxiety   Indication:  anxiety     mirtazapine 30 MG tablet  Commonly known as:  REMERON  Take 1 tablet (30 mg total) by mouth at bedtime. For depression   Indication:  Trouble Sleeping, Major Depressive Disorder         Follow-up recommendations:  Activity:  As tolerated Diet:  As tolerated  Comments:   Patient has been instructed to take medications as prescribed; and Ochoa adverse effects to outpatient provider.  Follow up with primary doctor for any medical issues and If symptoms recur Ochoa to nearest emergency or crisis hot line.    Total Discharge Time: Greater than 30 minutes  Signed: Earleen Newport, FNP-BC 05/08/2014, 3:51 PM   I have personally seen the patient and agreed with the findings and involved in the treatment plan. Berniece Andreas, MD

## 2014-05-08 NOTE — Plan of Care (Signed)
Problem: Alteration in mood & ability to function due to Goal: STG: Patient verbalizes decreases in signs of withdrawal Outcome: Not Progressing Pt continues to report anxiety, the feeling of shakiness, tremulous at times, diarrhea and sweating.

## 2014-05-08 NOTE — BHH Group Notes (Signed)
[facility] LCSW Group Therapy  05/08/2014 1:15 PM   Type of Therapy:  Group Therapy  Participation Level:  Did Not Attend - pt came as group was wrapping up  Regan Lemming, [place] 05/08/2014 3:19 PM

## 2014-05-08 NOTE — Progress Notes (Signed)
Patient ID: Kaylee Ochoa, female   DOB: 05/01/1965, 49 y.o.   MRN: [id number]   D: Pt has been very flat and depressed on the unit today, she has been upset that she has to go to [facility]. Pt reported that she did not want to go there, and that no one was listening to her. Pt reported her depression as a 9, and her helplessness as a 8. Pt reported that if she is sent somewhere that she does not want to go she will not followup. Pt reported being negative SI/HI, no AH/VH noted. A: 15 min checks continued for patient safety. R: Pt safety maintained.

## 2014-05-08 NOTE — Progress Notes (Signed)
D)  Had a visit from friends who were going to provide a ride to [facility] in am.  States has discussed the program with them and is feeling more positive about going.  States is going to make it a good experience and is going to do her best.  Talked about her fiance who died and said she has to do this for herself, doesn't want to be like him, setting her goals so that she can help others.  A)  Will continue to monitor for safety, continue POC, support and encouragement. R)  Safety maintained.

## 2014-05-08 NOTE — BHH Suicide Risk Assessment (Signed)
[facility] Discharge Suicide Risk Assessment   Demographic Factors:  Living alone  Total Time spent with patient: 30 minutes  Musculoskeletal: Strength & Muscle Tone: within normal limits Gait & Station: normal Patient leans: N/A  Psychiatric Specialty Exam: Physical Exam  Eyes: Right eye exhibits discharge.    ROS  Blood pressure 126/76, pulse 65, temperature 98.7 F (37.1 C), temperature source Oral, resp. rate 20, height 5\' 2"  (1.575 m), weight 49.896 kg (110 lb), last menstrual period 02/12/2014, SpO2 100 %.Body mass index is 20.11 kg/(m^2).  General Appearance: Casual  Eye Contact::  Good  Speech:  Normal Rate409  Volume:  Normal  Mood:  Anxious  Affect:  Congruent  Thought Process:  Coherent  Orientation:  Full (Time, Place, and Person)  Thought Content:  WDL  Suicidal Thoughts:  No  Homicidal Thoughts:  No  Memory:  Immediate;   Good  Judgement:  Intact  Insight:  Good  Psychomotor Activity:  Normal  Concentration:  Good  Recall:  Good  Fund of Knowledge:Good  Language: Good  Akathisia:  No  Handed:  Right  AIMS (if indicated):     Assets:  Communication Skills Desire for Improvement Housing Resilience  Sleep:  Number of Hours: 2.5  Cognition: WNL  ADL's:  Intact   Have you used any form of tobacco in the last 30 days? (Cigarettes, Smokeless Tobacco, Cigars, and/or Pipes): Yes  Has this patient used any form of tobacco in the last 30 days? (Cigarettes, Smokeless Tobacco, Cigars, and/or Pipes) No  Mental Status Per Nursing Assessment::   On Admission:  NA  Current Mental Status by Physician: NA  Loss Factors: Loss of significant relationship  Historical Factors: Impulsivity  Risk Reduction Factors:   Religious beliefs about death, Positive social support, Positive therapeutic relationship and Positive coping skills or problem solving skills  Continued Clinical Symptoms:  Alcohol/Substance Abuse/Dependencies  Cognitive Features That Contribute To  Risk:  Closed-mindedness    Suicide Risk:  Minimal: No identifiable suicidal ideation.  Patients presenting with no risk factors but with morbid ruminations; may be classified as minimal risk based on the severity of the depressive symptoms  Principal Problem: Alcohol use disorder, severe, dependence Discharge Diagnoses:  Patient Active Problem List   Diagnosis Date Noted  . Bereavement [Z63.4] 05/05/2014  . Alcohol use disorder, severe, dependence [F10.20] 05/04/2014  . 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 Alcohol withdrawal [F10.239] 05/04/2014  . Alcohol dependence [F10.20] 05/27/2012      Plan Of Care/Follow-up recommendations:  Activity:  as tolerated Diet:  un changed from past  Is patient on multiple antipsychotic therapies at discharge:  No   Has Patient had three or more failed trials of antipsychotic monotherapy by history:  No  Recommended Plan for Multiple Antipsychotic Therapies: NA    Earl Zellmer T. 05/08/2014, 5:46 PM

## 2014-05-08 NOTE — Progress Notes (Signed)
Attended group 

## 2014-05-08 NOTE — Plan of Care (Signed)
Problem: Ineffective individual coping Goal: STG: Patient will remain free from self harm Outcome: Progressing Pt is able to contract for safety.  Has been compliant with medications and attending groups, journaling to document progress as well as questions and keep information.

## 2014-05-08 NOTE — BHH Counselor (Signed)
Patient aware of need to make decision regarding [facility] admit. Writer has asked adult weekend CSW to inform attending of patient's decision after checking in with her at 1PM as she will need to be ready for early discharge if she accepts the [facility] bed.   CSW did speak with pt's support person MS Tesh at [phone].  Sheilah Pigeon, LCSW

## 2014-05-08 NOTE — BHH Counselor (Signed)
Patient gave CSW permission to contact one of her supports, Monico Hoar  At [phone] as Ms Myrna Blazer has offered to provide transportation to [place] where patient has been accepted for admnit on 05/09/14.  No answer, voicemail message left at 10:20 AM. CSW also providing general information internet printout for [facility] and [facility] to patient. Patient expressed understanding she will be discharged early 05/09/14 to [place] or early afternoon 05/09/14 with no guarantee of a bed at any area treatment facility for which she qualifies.  Sheilah Pigeon, LCSW

## 2014-05-09 NOTE — Progress Notes (Signed)
Discharge Note:  Patient discharged with friends who are taking her to [facility] today.  Patient denied SI and HI.  Denied A/V hallucinations.  Denied pain.  Patient stated she received all her belongings, clothing, toiletries, misc items.  Suicide prevention information given and discussed with patient who stated she understood and had no questions.  Patient stated she appreciated all assistance received from [facility] staff.

## 2014-05-09 NOTE — Progress Notes (Signed)
  [facility] Adult [facility] Management Discharge Plan :  Will you be returning to the same living situation after discharge:  No. Patient to discharge to [place]. At discharge, do you have transportation home?: Yes,  Patient reports that a friend will provide transportation Do you have the ability to pay for your medications: Yes,  patient will be provided with prescriptions at discharge  Release of information consent forms completed and in the chart;  Patient's signature needed at discharge.  Patient to Follow up at: Follow-up Information    Follow up with [facility] On 05/09/2014.   Why:  Please present between 9 am and 11 am for continued treatment. Please call office if you need to reschedule.   Contact information:   [address], [place], [place] [address] Phone: [phone]      Patient denies SI/HI: Yes,  denies    Safety Planning and Suicide Prevention discussed: Yes,  with paitent and friend  Has patient been referred to the Quitline?: Patient refused referral  Adoni Greenough, Casimiro Needle 05/09/2014, 8:42 AM

## 2014-05-09 NOTE — Clinical Social Work Note (Signed)
CSW contacted [facility] to confirm that patient has a bed today. CSW spoke with RN Rise Paganini who reports that patient discharged this morning.   Tilden Fossa, MSW, [place] Worker [facility] [phone]

## 2014-05-12 NOTE — Progress Notes (Signed)
Patient Discharge Instructions:  No documentation was faxed to [place] for [facility].  Per the SW the information was already faxed. Patsey Berthold, 05/12/2014, 2:56 PM

## 2014-09-08 ENCOUNTER — Encounter (HOSPITAL_COMMUNITY): Payer: Self-pay | Admitting: *Deleted

## 2014-09-08 ENCOUNTER — Emergency Department (HOSPITAL_COMMUNITY)
Admission: EM | Admit: 2014-09-08 | Discharge: 2014-09-09 | Disposition: A | Discharge status: Home / Self Care | Payer: Medicaid Other | Attending: Emergency Medicine | Admitting: Emergency Medicine

## 2014-09-08 DIAGNOSIS — Z720 Tobacco use: Secondary | ICD-10-CM | POA: Insufficient documentation

## 2014-09-08 DIAGNOSIS — Z87448 Personal history of other diseases of urinary system: Secondary | ICD-10-CM | POA: Insufficient documentation

## 2014-09-08 DIAGNOSIS — F329 Major depressive disorder, single episode, unspecified: Secondary | ICD-10-CM | POA: Insufficient documentation

## 2014-09-08 DIAGNOSIS — I10 Essential (primary) hypertension: Secondary | ICD-10-CM | POA: Insufficient documentation

## 2014-09-08 DIAGNOSIS — F1020 Alcohol dependence, uncomplicated: Secondary | ICD-10-CM | POA: Insufficient documentation

## 2014-09-08 LAB — CBC WITH DIFFERENTIAL/PLATELET
Basophils Absolute: 0 10*3/uL (ref 0.0–0.1)
Basophils Relative: 0 % (ref 0–1)
Eosinophils Absolute: 0 10*3/uL (ref 0.0–0.7)
Eosinophils Relative: 0 % (ref 0–5)
HCT: 39.6 % (ref 36.0–46.0)
Hemoglobin: 13.6 g/dL (ref 12.0–15.0)
LYMPHS PCT: 45 % (ref 12–46)
Lymphs Abs: 3.2 10*3/uL (ref 0.7–4.0)
MCH: 34.5 pg — ABNORMAL HIGH (ref 26.0–34.0)
MCHC: 34.3 g/dL (ref 30.0–36.0)
MCV: 100.5 fL — AB (ref 78.0–100.0)
MONOS PCT: 9 % (ref 3–12)
Monocytes Absolute: 0.7 10*3/uL (ref 0.1–1.0)
NEUTROS ABS: 3.2 10*3/uL (ref 1.7–7.7)
NEUTROS PCT: 46 % (ref 43–77)
Platelets: 173 10*3/uL (ref 150–400)
RBC: 3.94 MIL/uL (ref 3.87–5.11)
RDW: 14.9 % (ref 11.5–15.5)
WBC: 7.1 10*3/uL (ref 4.0–10.5)

## 2014-09-08 LAB — COMPREHENSIVE METABOLIC PANEL
ALBUMIN: 4 g/dL (ref 3.5–5.0)
ALT: 41 U/L (ref 14–54)
AST: 117 U/L — AB (ref 15–41)
Alkaline Phosphatase: 200 U/L — ABNORMAL HIGH (ref 38–126)
Anion gap: 14 (ref 5–15)
BILIRUBIN TOTAL: 0.5 mg/dL (ref 0.3–1.2)
BUN: 8 mg/dL (ref 6–20)
CO2: 27 mmol/L (ref 22–32)
Calcium: 9.4 mg/dL (ref 8.9–10.3)
Chloride: 99 mmol/L — ABNORMAL LOW (ref 101–111)
Creatinine, Ser: 0.61 mg/dL (ref 0.44–1.00)
GFR calc non Af Amer: >60 mL/min (ref >60)
Glucose, Bld: 117 mg/dL — ABNORMAL HIGH (ref 65–99)
POTASSIUM: 3.8 mmol/L (ref 3.5–5.1)
SODIUM: 140 mmol/L (ref 135–145)
TOTAL PROTEIN: 7.3 g/dL (ref 6.5–8.1)

## 2014-09-08 LAB — RAPID URINE DRUG SCREEN, HOSP PERFORMED
AMPHETAMINES: NOT DETECTED
Barbiturates: NOT DETECTED
Benzodiazepines: NOT DETECTED
Cocaine: NOT DETECTED
Opiates: NOT DETECTED
Tetrahydrocannabinol: NOT DETECTED

## 2014-09-08 LAB — ETHANOL: ALCOHOL ETHYL (B): 480 mg/dL — AB (ref <5)

## 2014-09-08 MED ORDER — SODIUM CHLORIDE 0.9 % IV BOLUS (SEPSIS)
1000.0000 mL | Freq: Once | INTRAVENOUS | Status: AC
  Administered 2014-09-09: 1000 mL via INTRAVENOUS

## 2014-09-08 NOTE — ED Notes (Signed)
Pt was found unconscious by her friends. Pt has been on a 2 day drinking binge. Pt's fiance died 2 months ago. Pt went to his house and drank for 2 days. Pt states she is depressed. Pt denies SI/HI.
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
## 2014-09-08 NOTE — ED Notes (Signed)
CRITICAL VALUE ALERT  Critical value received: ETOH: 480  Date of notification: 09/08/2014  Time of notification:  2209  Critical value read back: yes  Nurse who received alert:  Natividad Brood, RN  MD notified (1st page):  Called Dr. Lacinda Axon [phone]  Time of first page:   MD notified (2nd page):  Time of second page:  Responding MD:    Time MD responded:  2210

## 2014-09-09 MED ORDER — CHLORDIAZEPOXIDE HCL 25 MG PO CAPS: ORAL_CAPSULE | ORAL | Status: DC

## 2014-09-09 NOTE — ED Provider Notes (Signed)
Patient now awake and alert. She was seen earlier for acute alcohol intoxication. Patient's significant other is present and willing to take her home. I did discuss with her prescription of Librium taper. She understands that she cannot drink alcohol taking this. She'll be provided with resources for outpatient detox/rehabilitation.  Orpah Greek, MD 09/09/14 [phone]

## 2014-09-09 NOTE — ED Notes (Signed)
Pt sleeping. 

## 2014-09-09 NOTE — Discharge Instructions (Signed)
Alcohol Use Disorder °Alcohol use disorder is a mental disorder. It is not a one-time incident of heavy drinking. Alcohol use disorder is the excessive and uncontrollable use of alcohol over time that leads to problems with functioning in one or more areas of daily living. People with this disorder risk harming themselves and others when they drink to excess. Alcohol use disorder also can cause other mental disorders, such as mood and anxiety disorders, and serious physical problems. People with alcohol use disorder often misuse other drugs.  °Alcohol use disorder is common and widespread. Some people with this disorder drink alcohol to cope with or escape from negative life events. Others drink to relieve chronic pain or symptoms of mental illness. People with a family history of alcohol use disorder are at higher risk of losing control and using alcohol to excess.  °SYMPTOMS  °Signs and symptoms of alcohol use disorder may include the following:  °· Consumption of alcohol in larger amounts or over a longer period of time than intended. °· Multiple unsuccessful attempts to cut down or control alcohol use.   °· A great deal of time spent obtaining alcohol, using alcohol, or recovering from the effects of alcohol (hangover). °· A strong desire or urge to use alcohol (cravings).   °· Continued use of alcohol despite problems at work, school, or home because of alcohol use.   °· Continued use of alcohol despite problems in relationships because of alcohol use. °· Continued use of alcohol in situations when it is physically hazardous, such as driving a car. °· Continued use of alcohol despite awareness of a physical or psychological problem that is likely related to alcohol use. Physical problems related to alcohol use can involve the brain, heart, liver, stomach, and intestines. Psychological problems related to alcohol use include intoxication, depression, anxiety, psychosis, delirium, and dementia.   °· The need for  increased amounts of alcohol to achieve the same desired effect, or a decreased effect from the consumption of the same amount of alcohol (tolerance). °· Withdrawal symptoms upon reducing or stopping alcohol use, or alcohol use to reduce or avoid withdrawal symptoms. Withdrawal symptoms include: °¨ Racing heart. °¨ Hand tremor. °¨ Difficulty sleeping. °¨ Nausea. °¨ Vomiting. °¨ Hallucinations. °¨ Restlessness. °¨ Seizures. °DIAGNOSIS °Alcohol use disorder is diagnosed through an assessment by your health care provider. Your health care provider may start by asking three or four questions to screen for excessive or problematic alcohol use. To confirm a diagnosis of alcohol use disorder, at least two symptoms must be present within a 12-month period. The severity of alcohol use disorder depends on the number of symptoms: °· Mild--two or three. °· Moderate--four or five. °· Severe--six or more. °Your health care provider may perform a physical exam or use results from lab tests to see if you have physical problems resulting from alcohol use. Your health care provider may refer you to a mental health professional for evaluation. °TREATMENT  °Some people with alcohol use disorder are able to reduce their alcohol use to low-risk levels. Some people with alcohol use disorder need to quit drinking alcohol. When necessary, mental health professionals with specialized training in substance use treatment can help. Your health care provider can help you decide how severe your alcohol use disorder is and what type of treatment you need. The following forms of treatment are available:  °· Detoxification. Detoxification involves the use of prescription medicines to prevent alcohol withdrawal symptoms in the first week after quitting. This is important for people with a history of symptoms   of withdrawal and for heavy drinkers who are likely to have withdrawal symptoms. Alcohol withdrawal can be dangerous and, in severe cases, cause  death. Detoxification is usually provided in a hospital or in-patient substance use treatment facility. °· Counseling or talk therapy. Talk therapy is provided by substance use treatment counselors. It addresses the reasons people use alcohol and ways to keep them from drinking again. The goals of talk therapy are to help people with alcohol use disorder find healthy activities and ways to cope with life stress, to identify and avoid triggers for alcohol use, and to handle cravings, which can cause relapse. °· Medicines. Different medicines can help treat alcohol use disorder through the following actions: °¨ Decrease alcohol cravings. °¨ Decrease the positive reward response felt from alcohol use. °¨ Produce an uncomfortable physical reaction when alcohol is used (aversion therapy). °· Support groups. Support groups are run by people who have quit drinking. They provide emotional support, advice, and guidance. °These forms of treatment are often combined. Some people with alcohol use disorder benefit from intensive combination treatment provided by specialized substance use treatment centers. Both inpatient and outpatient treatment programs are available. °Document Released: 05/09/2004 Document Revised: 08/16/2013 Document Reviewed: 07/09/2012 °Exit[employer]® Patient Information ©2015 [employer]. This information is not intended to replace advice given to you by your health care provider. Make sure you discuss any questions you have with your health care provider. °Substance Abuse Treatment Programs ° °Intensive Outpatient Programs °[facility]     °[address]      °[place], [place]                   °336-878-6098      ° °[facility] °[address] °[place], [place] °336-379-7146 ° °[facility]     °(Inpatient and outpatient)     °[address]           °[phone]   ° °[facility] °336-288-1484 (Suboxone and  Methadone) ° °[address]      °[place], [place] [address]      °336-882-2125      ° °[address] °[place], [place] °852-3033 ° °[facility] (Outpatient/Inpatient, [facility])    °(insurance only) [phone]      °       °[facility]Groups & Residential) °[place], [place] °336-389-1413 ° °   °[facility]     °[address]     °[place], [place]      °[phone]      ° °[facility] (for caregivers and family) °[address] °[place], [place] °336-299-4655 ° ° ° ° ° °Residential Treatment Programs °[facility]      °[address], [place], [place] [address]  °([phone]      ° °[facility] °[address] [place], [place] [address] °[phone] ° °[facility]        °336-248-8914      ° °[facility] °1-800-659-3381 ° °[facility]             °[address]                                         °[place], [place]                                                °  877-615-2722 or 336-784-9470                              ° °[facility] [place] °[address] °[place], [address] °1.877.941.8954 ° °[facility]    °[address]      °[place], [place]     °[phone]      ° °[facility] °[address] °[place], [place] °336-285-9073 ° °[facility]   °[address]     °[place], [place] [address]     °[phone]      °Admissions: 8am-3pm M-F ° °[facility] °[address] °[place], [place] °336-227-7417 ° °[facility]: Residential Program (90 Days)   °[place], [place]      °336-725-8389 or 800-758-6077    ° °[facility] °[place], [place] °(Walk in Hours over the weekend or by referral) ° °[facility] °[address], [place], [place] [address] °(336) [phone] ° °Crisis Mobile: [facility]:  1-877-626-1772 (for crisis response 24 hours a day) °[facility] Hotline:       1-800-256-2452 °Outpatient Psychiatry and Counseling ° °[facility]: Mobile Crisis Management 24 hours:  1-877-626-1772 ° °[facility] sliding scale fee and walk in schedule: M-F 8am-12pm/1pm-3pm °[address]  °[place], [place] [address] °[phone] ° °[facility] °[address] °[place], [place] [address] °336-703-9650 ° °[facility] ([facility] known [facility])- new patient walk-in appointments available Monday - Friday 8am -3pm.          °[address] °[place], [place] [address] °336-676-6840 or crisis line- 336-676-6905 ° °[facility]/ Intensive Outpatient Therapy Program °[address] °[place], [place] [address] °336-832-9804 ° °[facility]                  °Crisis Services      °[phone]      °[address]     °[place], [place] [address]                ° °[facility]   °[facility] °[phone] °[address] °[place], [place] [address] ° ° °[facility]          °[address]  °[place], [place] [address]       °(336) [phone] ° °[facility] °[address] °[place], [place] [address] °336-292-1510 ° °[facility]    °[address]    °[place], [place] [address]     °336-632-3505      ° °Parish McKinney, [place]     °[address]     °[place] [address]     °336-282-1251     °  °[facility] °[address] °[place] [address] ° °[facility]     °[address]     °[place], [place]      °336-542-2076      ° °[facility] °Shamsher Ahluwalia, MD °[address] °[place], [place] [address] °[phone] ° °[facility]     °[address]     °[place], [place] [address]     °336-274-1237      ° °[facility] °[address] °, [place] [address] °336-854-4450 °Resources  for   Temporary Residential Assistance/Crisis Centers ° °DAY CENTERS °[facility] °M-F 8am-3pm   °[address] [place], [place] [address]   336-332-0824 °Services include: laundry, barbering, support groups, case management, phone  & computer access, showers, AA/NA mtgs, mental health/substance abuse nurse, job skills class, disability information, VA assistance, spiritual classes, etc.  ° °HOMELESS SHELTERS ° °[facility]     °[facility]   °[address], [place]     °[phone]       °       °[facility] (women and children)       °[address] °[place], [place] [address] °336-275-0820 °[email or web address] for application and process °Application Required ° °[facility]   °[address]    °[place] [address]     °[phone]       °             °[facility] °[address] °[place], [place] [address] °336.273.5572 °336-235-0363(schedule application appt.) °Application Required ° °[facility] (women only)    °[address]     °[place], [place] [address]     °336-884-1039      °Intake starts 6pm daily °Need valid ID, SSC, & Police report °[facility] [place] °[address] °[place], [place] °336-881-5420 °Application Required ° °[facility] (men only)     °[address]      °[place], [place]     °[phone]      ° °[facility] °(Pregnant women only) °[address] °, [place] °336-275-0206 ° °[facility]      °[address]      °[place], [place] [address]     °[phone]      °       °[facility] °[address] °[place], [place] °336-723-1848 °90 day commitment/SA/Application process ° °[facility](men only)     °[address]     °[place], [place]     °[phone]       °Check-in at 7pm     °       °[facility] °[address] °[place], [place]  [address] °336-248-6684 °Men/Women/Women and Children must be there by 7 pm ° °[facility] °[place],  °336-722-8721                ° °

## 2014-09-09 NOTE — ED Notes (Signed)
Pt given her 4th warm blanket.

## 2014-09-13 ENCOUNTER — Encounter (HOSPITAL_COMMUNITY): Payer: Self-pay | Admitting: Physical Medicine and Rehabilitation

## 2014-09-13 ENCOUNTER — Telehealth (HOSPITAL_COMMUNITY): Payer: Self-pay | Admitting: Physician Assistant

## 2014-09-13 ENCOUNTER — Emergency Department (HOSPITAL_COMMUNITY)
Admission: EM | Admit: 2014-09-13 | Discharge: 2014-09-14 | Disposition: A | Discharge status: Other Acute Inpatient Hospital | Payer: Medicaid Other | Attending: Emergency Medicine | Admitting: Emergency Medicine

## 2014-09-13 DIAGNOSIS — F1010 Alcohol abuse, uncomplicated: Secondary | ICD-10-CM | POA: Insufficient documentation

## 2014-09-13 DIAGNOSIS — F329 Major depressive disorder, single episode, unspecified: Secondary | ICD-10-CM | POA: Insufficient documentation

## 2014-09-13 DIAGNOSIS — Z87448 Personal history of other diseases of urinary system: Secondary | ICD-10-CM | POA: Insufficient documentation

## 2014-09-13 DIAGNOSIS — Z720 Tobacco use: Secondary | ICD-10-CM | POA: Insufficient documentation

## 2014-09-13 DIAGNOSIS — I10 Essential (primary) hypertension: Secondary | ICD-10-CM | POA: Insufficient documentation

## 2014-09-13 DIAGNOSIS — R45851 Suicidal ideations: Secondary | ICD-10-CM

## 2014-09-13 DIAGNOSIS — Z79899 Other long term (current) drug therapy: Secondary | ICD-10-CM | POA: Insufficient documentation

## 2014-09-13 DIAGNOSIS — Z3202 Encounter for pregnancy test, result negative: Secondary | ICD-10-CM | POA: Insufficient documentation

## 2014-09-13 LAB — URINALYSIS, ROUTINE W REFLEX MICROSCOPIC
Bilirubin Urine: NEGATIVE
Glucose, UA: NEGATIVE mg/dL
Hgb urine dipstick: NEGATIVE
KETONES UR: NEGATIVE mg/dL
LEUKOCYTES UA: NEGATIVE
NITRITE: NEGATIVE
Protein, ur: NEGATIVE mg/dL
SPECIFIC GRAVITY, URINE: 1.015 (ref 1.005–1.030)
UROBILINOGEN UA: 0.2 mg/dL (ref 0.0–1.0)
pH: 6.5 (ref 5.0–8.0)

## 2014-09-13 LAB — CBC WITH DIFFERENTIAL/PLATELET
BASOS ABS: 0 10*3/uL (ref 0.0–0.1)
BASOS PCT: 0 % (ref 0–1)
Eosinophils Absolute: 0.1 10*3/uL (ref 0.0–0.7)
Eosinophils Relative: 1 % (ref 0–5)
HCT: 38.7 % (ref 36.0–46.0)
HEMOGLOBIN: 13.1 g/dL (ref 12.0–15.0)
LYMPHS PCT: 34 % (ref 12–46)
Lymphs Abs: 2.5 10*3/uL (ref 0.7–4.0)
MCH: 34.4 pg — ABNORMAL HIGH (ref 26.0–34.0)
MCHC: 33.9 g/dL (ref 30.0–36.0)
MCV: 101.6 fL — AB (ref 78.0–100.0)
MONO ABS: 0.4 10*3/uL (ref 0.1–1.0)
Monocytes Relative: 6 % (ref 3–12)
NEUTROS ABS: 4.2 10*3/uL (ref 1.7–7.7)
Neutrophils Relative %: 59 % (ref 43–77)
PLATELETS: 122 10*3/uL — AB (ref 150–400)
RBC: 3.81 MIL/uL — AB (ref 3.87–5.11)
RDW: 15.1 % (ref 11.5–15.5)
WBC: 7.2 10*3/uL (ref 4.0–10.5)

## 2014-09-13 LAB — COMPREHENSIVE METABOLIC PANEL
ALBUMIN: 3.8 g/dL (ref 3.5–5.0)
ALK PHOS: 198 U/L — AB (ref 38–126)
ALT: 50 U/L (ref 14–54)
ANION GAP: 14 (ref 5–15)
AST: 212 U/L — ABNORMAL HIGH (ref 15–41)
BUN: 6 mg/dL (ref 6–20)
CHLORIDE: 103 mmol/L (ref 101–111)
CO2: 25 mmol/L (ref 22–32)
CREATININE: 0.47 mg/dL (ref 0.44–1.00)
Calcium: 8.6 mg/dL — ABNORMAL LOW (ref 8.9–10.3)
GFR calc Af Amer: >60 mL/min (ref >60)
GFR calc non Af Amer: >60 mL/min (ref >60)
GLUCOSE: 103 mg/dL — AB (ref 65–99)
Potassium: 2.9 mmol/L — ABNORMAL LOW (ref 3.5–5.1)
Sodium: 142 mmol/L (ref 135–145)
Total Bilirubin: 0.4 mg/dL (ref 0.3–1.2)
Total Protein: 6.8 g/dL (ref 6.5–8.1)

## 2014-09-13 LAB — ACETAMINOPHEN LEVEL: Acetaminophen (Tylenol), Serum: <10 ug/mL — ABNORMAL LOW (ref 10–30)

## 2014-09-13 LAB — SALICYLATE LEVEL

## 2014-09-13 LAB — POC URINE PREG, ED: PREG TEST UR: NEGATIVE

## 2014-09-13 LAB — RAPID URINE DRUG SCREEN, HOSP PERFORMED
Amphetamines: NOT DETECTED
BARBITURATES: NOT DETECTED
Benzodiazepines: NOT DETECTED
Cocaine: NOT DETECTED
Opiates: NOT DETECTED
TETRAHYDROCANNABINOL: NOT DETECTED

## 2014-09-13 LAB — ETHANOL: Alcohol, Ethyl (B): 353 mg/dL (ref <5)

## 2014-09-13 MED ORDER — VITAMIN B-1 100 MG PO TABS: 100.0000 mg | ORAL_TABLET | Freq: Every day | ORAL | Status: DC

## 2014-09-13 MED ORDER — THIAMINE HCL 100 MG/ML IJ SOLN: 100.0000 mg | Freq: Every day | INTRAMUSCULAR | Status: DC

## 2014-09-13 MED ORDER — LORAZEPAM 1 MG PO TABS
0.0000 mg | ORAL_TABLET | Freq: Four times a day (QID) | ORAL | Status: DC
  Administered 2014-09-13 (×2): 1 mg via ORAL
  Filled 2014-09-13: qty 2
  Filled 2014-09-13: qty 1

## 2014-09-13 MED ORDER — VITAMIN B-1 100 MG PO TABS
100.0000 mg | ORAL_TABLET | Freq: Every day | ORAL | Status: DC
  Administered 2014-09-13: 100 mg via ORAL
  Filled 2014-09-13: qty 1

## 2014-09-13 MED ORDER — LORAZEPAM 2 MG/ML IJ SOLN: 0.0000 mg | Freq: Four times a day (QID) | INTRAMUSCULAR | Status: DC

## 2014-09-13 MED ORDER — LORAZEPAM 1 MG PO TABS: 0.0000 mg | ORAL_TABLET | Freq: Two times a day (BID) | ORAL | Status: DC

## 2014-09-13 MED ORDER — LORAZEPAM 2 MG/ML IJ SOLN: 0.0000 mg | Freq: Two times a day (BID) | INTRAMUSCULAR | Status: DC

## 2014-09-13 MED ORDER — LORAZEPAM 2 MG/ML IJ SOLN
0.0000 mg | Freq: Two times a day (BID) | INTRAMUSCULAR | Status: DC
Start: 2014-09-15 — End: 2014-09-14

## 2014-09-13 NOTE — ED Notes (Signed)
Called security to be wanded, staffing and charge RN notified.

## 2014-09-13 NOTE — ED Notes (Signed)
Pt presents to department for evaluation of ETOH abuse and suicidal ideation. States "I don't feel like living." last drink this afternoon. Pt crying in triage. She is alert, but states she doesn't want to talk. Friend at bedside.

## 2014-09-13 NOTE — ED Notes (Signed)
ETOH 353.

## 2014-09-13 NOTE — ED Provider Notes (Signed)
CSN: [id number]     Arrival date & time 09/13/14  1648 History   First MD Initiated Contact with Patient 09/13/14 1713     Chief Complaint  Patient presents with  . Alcohol Problem  . Suicidal     (Consider location/radiation/quality/duration/timing/severity/associated sxs/prior Treatment) Patient is a 49 y.o. female presenting with mental health disorder. The history is provided by the patient and a caregiver.  Mental Health Problem Presenting symptoms: depression, hallucinations and suicidal thoughts   Presenting symptoms: no homicidal ideas, no self mutilation and no suicide attempt   Degree of incapacity (severity):  Moderate Onset quality:  Gradual Duration:  1 day Timing:  Constant Progression:  Unchanged Chronicity:  New Context: alcohol use and stressful life event   Treatment compliance:  Untreated Relieved by:  Nothing Worsened by:  Alcohol Ineffective treatments:  None tried Associated symptoms: no abdominal pain, no appetite change, no chest pain, no euphoric mood, no fatigue and no headaches   Risk factors: hx of mental illness and hx of suicide attempts     Past Medical History  Diagnosis Date  . Hypertension   . Renal disorder   . Depression   . ETOH abuse    Past Surgical History  Procedure Laterality Date  . Breast surgery    . Cyst removal on breast     No family history on file. History  Substance Use Topics  . Smoking status: Current Every Day Smoker -- 0.25 packs/day for 10 years    Types: Cigarettes  . Smokeless tobacco: Not on file  . Alcohol Use: Yes     Comment: daily   OB History    No data available     Review of Systems  Constitutional: Negative for fever, chills, diaphoresis, appetite change and fatigue.  Eyes: Negative for photophobia and visual disturbance.  Respiratory: Negative for cough, chest tightness and shortness of breath.   Cardiovascular: Negative for chest pain, palpitations and leg swelling.  Gastrointestinal:  Negative for nausea, vomiting, abdominal pain, diarrhea and abdominal distention.  Genitourinary: Negative for dysuria and difficulty urinating.  Musculoskeletal: Negative for back pain, neck pain and neck stiffness.  Skin: Negative for color change, pallor and rash.  Neurological: Negative for dizziness, syncope, weakness, light-headedness, numbness and headaches.  Psychiatric/Behavioral: Positive for suicidal ideas, hallucinations and dysphoric mood. Negative for homicidal ideas and self-injury.  All other systems reviewed and are negative.     Allergies  Other and Sulfa antibiotics  Home Medications   Prior to Admission medications   Medication Sig Start Date End Date Taking? Authorizing Provider  albuterol (PROVENTIL HFA;VENTOLIN HFA) 108 (90 BASE) MCG/ACT inhaler Inhale 2 puffs into the lungs every 6 (six) hours as needed for wheezing or shortness of breath. 05/08/14  Yes Shuvon B Rankin, NP  chlordiazePOXIDE (LIBRIUM) 25 MG capsule 50mg  PO TID x 1D, then 25-50mg  PO BID X 1D, then 25-50mg  PO QD X 1D Patient not taking: Reported on 09/13/2014 09/09/14   Orpah Greek, MD  doxepin (SINEQUAN) 10 MG capsule Take 1 capsule (10 mg total) by mouth at bedtime. For sleep Patient not taking: Reported on 09/08/2014 05/08/14   Shuvon B Rankin, NP  hydrOXYzine (ATARAX/VISTARIL) 25 MG tablet Take 1 tablet (25 mg total) by mouth every 6 (six) hours as needed for anxiety. For anxiety Patient not taking: Reported on 09/08/2014 05/08/14   Shuvon B Rankin, NP  mirtazapine (REMERON) 30 MG tablet Take 1 tablet (30 mg total) by mouth at bedtime. For depression Patient  not taking: Reported on 09/08/2014 05/08/14   Shuvon B Rankin, NP   BP 137/96 mmHg  Pulse 102  Temp(Src) 98.4 F (36.9 C) (Oral)  Resp 18  SpO2 98%  LMP 08/14/2014 Physical Exam  Constitutional: She is oriented to person, place, and time. She appears well-developed and well-nourished. No distress.  HENT:  Head: Normocephalic and  atraumatic.  Mouth/Throat: Oropharynx is clear and moist.  Eyes: Conjunctivae and EOM are normal. Pupils are equal, round, and reactive to light.  Neck: Normal range of motion. Neck supple.  Cardiovascular: Normal rate, regular rhythm, normal heart sounds and intact distal pulses.  Exam reveals no gallop and no friction rub.   No murmur heard. Pulmonary/Chest: Effort normal and breath sounds normal. No respiratory distress. She has no wheezes. She has no rales.  Abdominal: Soft. Bowel sounds are normal. She exhibits no distension. There is no tenderness. There is no rebound and no guarding.  Musculoskeletal: Normal range of motion. She exhibits no edema or tenderness.  Neurological: She is alert and oriented to person, place, and time. She has normal strength. No cranial nerve deficit or sensory deficit. Coordination normal. GCS eye subscore is 4. GCS verbal subscore is 5. GCS motor subscore is 6.  Skin: Skin is warm and dry. No rash noted. She is not diaphoretic. No erythema. No pallor.  Psychiatric: Her speech is normal and behavior is normal. Judgment normal. Thought content is not paranoid and not delusional. Cognition and memory are normal. She exhibits a depressed mood. She expresses suicidal ideation. She expresses no homicidal ideation. She expresses no suicidal plans and no homicidal plans.  Vague SI- pt states she no longer feels like living but no plan  Nursing note and vitals reviewed.   ED Course  Procedures (including critical care time) Labs Review Labs Reviewed  CBC WITH DIFFERENTIAL/PLATELET - Abnormal; Notable for the following:    RBC 3.81 (*)    MCV 101.6 (*)    MCH 34.4 (*)    Platelets 122 (*)    All other components within normal limits  COMPREHENSIVE METABOLIC PANEL - Abnormal; Notable for the following:    Potassium 2.9 (*)    Glucose, Bld 103 (*)    Calcium 8.6 (*)    AST 212 (*)    Alkaline Phosphatase 198 (*)    All other components within normal limits   ACETAMINOPHEN LEVEL - Abnormal; Notable for the following:    Acetaminophen (Tylenol), Serum <10 (*)    All other components within normal limits  ETHANOL - Abnormal; Notable for the following:    Alcohol, Ethyl (B) 353 (*)    All other components within normal limits  SALICYLATE LEVEL  URINE RAPID DRUG SCREEN (HOSP PERFORMED) NOT AT [facility]  URINALYSIS, ROUTINE W REFLEX MICROSCOPIC (NOT AT [facility])  POC URINE PREG, ED    Imaging Review No results found.   EKG Interpretation None      MDM   Final diagnoses:  Alcohol abuse  Suicidal ideation    49 yo F with PMH of alcohol abuse, self reported h/o bipolar disorder not currently treated, presenting with request for detox from alcohol as well as SI.  Pt reports abusing alcohol for many years, worsened since her fiance died 2 months ago also of alcohol related complications.  Pt reports concern about EtOh abuse since her fiance's death. Has been to different EDs over past week requesting detox.  Pt was evaluated 5/26 where EtOh level was 480.  Per documentation she  denied SI at that time.  However today her case worker became concerned as pt stated she no longer cared if she lived and called a friend to tell her how she wanted her hair to look in her coffin.  Pt reports AVH over past 3 months, sees her sister who is deceased, and reports hearing voices which occasionally tell her to harm herself.  She currently has no plan of self harm.  Denies HI.  Denies other drug use. Was prescribed Librium taper on 5/26 but states she has not taken it because she was told that she could not take it if she drank alcohol.  Reports last drink was earlier this afternoon.  On presentation, pt alert, tearful but goal directed speech and non-tangential.  Exam otherwise as above.    Labs obtained show EtOh of 353.  LFTs elevated but near baseline.  TTS consulted.  Pt placed on CIWA protocol.  Care of pt to Dr. Tawnya Crook in stable condition.  Discussed with  attending Dr. Alvino Chapel.    Ellwood Dense, MD 09/14/14 [place], MD 09/15/14 [phone]

## 2014-09-13 NOTE — ED Notes (Signed)
Pt. Asking if we "were going to check her alcohol level to make sure she isn't going to die"

## 2014-09-14 ENCOUNTER — Inpatient Hospital Stay (HOSPITAL_COMMUNITY)
Admission: EM | Admit: 2014-09-14 | Discharge: 2014-09-16 | DRG: 897 | Disposition: A | Discharge status: Home / Self Care | Payer: 59 | Source: Intra-hospital | Attending: Psychiatry | Admitting: Psychiatry

## 2014-09-14 ENCOUNTER — Encounter (HOSPITAL_COMMUNITY): Payer: Self-pay | Admitting: Behavioral Health

## 2014-09-14 DIAGNOSIS — F17210 Nicotine dependence, cigarettes, uncomplicated: Secondary | ICD-10-CM | POA: Diagnosis present

## 2014-09-14 DIAGNOSIS — R945 Abnormal results of liver function studies: Secondary | ICD-10-CM

## 2014-09-14 DIAGNOSIS — F1020 Alcohol dependence, uncomplicated: Secondary | ICD-10-CM | POA: Diagnosis present

## 2014-09-14 DIAGNOSIS — F1024 Alcohol dependence with alcohol-induced mood disorder: Secondary | ICD-10-CM | POA: Insufficient documentation

## 2014-09-14 DIAGNOSIS — F329 Major depressive disorder, single episode, unspecified: Secondary | ICD-10-CM | POA: Diagnosis present

## 2014-09-14 DIAGNOSIS — R7989 Other specified abnormal findings of blood chemistry: Secondary | ICD-10-CM | POA: Insufficient documentation

## 2014-09-14 DIAGNOSIS — I10 Essential (primary) hypertension: Secondary | ICD-10-CM | POA: Diagnosis present

## 2014-09-14 DIAGNOSIS — Y908 Blood alcohol level of 240 mg/100 ml or more: Secondary | ICD-10-CM | POA: Diagnosis present

## 2014-09-14 DIAGNOSIS — F322 Major depressive disorder, single episode, severe without psychotic features: Secondary | ICD-10-CM

## 2014-09-14 LAB — COMPREHENSIVE METABOLIC PANEL
ALT: 80 U/L — ABNORMAL HIGH (ref 14–54)
ANION GAP: 12 (ref 5–15)
AST: 461 U/L — AB (ref 15–41)
Albumin: 3.9 g/dL (ref 3.5–5.0)
Alkaline Phosphatase: 227 U/L — ABNORMAL HIGH (ref 38–126)
BILIRUBIN TOTAL: 0.5 mg/dL (ref 0.3–1.2)
BUN: 6 mg/dL (ref 6–20)
CHLORIDE: 95 mmol/L — AB (ref 101–111)
CO2: 30 mmol/L (ref 22–32)
Calcium: 9.8 mg/dL (ref 8.9–10.3)
Creatinine, Ser: 0.5 mg/dL (ref 0.44–1.00)
GFR calc Af Amer: >60 mL/min (ref >60)
GFR calc non Af Amer: >60 mL/min (ref >60)
Glucose, Bld: 131 mg/dL — ABNORMAL HIGH (ref 65–99)
POTASSIUM: 3.3 mmol/L — AB (ref 3.5–5.1)
SODIUM: 137 mmol/L (ref 135–145)
Total Protein: 7.4 g/dL (ref 6.5–8.1)

## 2014-09-14 LAB — TSH: TSH: 8.533 u[IU]/mL — ABNORMAL HIGH (ref 0.350–4.500)

## 2014-09-14 LAB — T4, FREE: Free T4: 0.56 ng/dL — ABNORMAL LOW (ref 0.61–1.12)

## 2014-09-14 MED ORDER — ONDANSETRON 4 MG PO TBDP
4.0000 mg | ORAL_TABLET | Freq: Four times a day (QID) | ORAL | Status: DC | PRN
  Administered 2014-09-14: 4 mg via ORAL
  Filled 2014-09-14: qty 1

## 2014-09-14 MED ORDER — ADULT MULTIVITAMIN W/MINERALS CH
1.0000 | ORAL_TABLET | Freq: Every day | ORAL | Status: DC
  Administered 2014-09-14 – 2014-09-16 (×3): 1 via ORAL
  Filled 2014-09-14: qty 1
  Filled 2014-09-14: qty 14
  Filled 2014-09-14 (×4): qty 1

## 2014-09-14 MED ORDER — THIAMINE HCL 100 MG/ML IJ SOLN
100.0000 mg | Freq: Once | INTRAMUSCULAR | Status: AC
  Administered 2014-09-14: 100 mg via INTRAMUSCULAR
  Filled 2014-09-14: qty 2

## 2014-09-14 MED ORDER — LORAZEPAM 1 MG PO TABS
1.0000 mg | ORAL_TABLET | Freq: Four times a day (QID) | ORAL | Status: DC | PRN
  Administered 2014-09-15: 1 mg via ORAL
  Filled 2014-09-14: qty 1

## 2014-09-14 MED ORDER — POTASSIUM CHLORIDE CRYS ER 20 MEQ PO TBCR
40.0000 meq | EXTENDED_RELEASE_TABLET | Freq: Once | ORAL | Status: AC
  Administered 2014-09-14: 40 meq via ORAL
  Filled 2014-09-14: qty 2

## 2014-09-14 MED ORDER — ALUM & MAG HYDROXIDE-SIMETH 200-200-20 MG/5ML PO SUSP: 30.0000 mL | ORAL | Status: DC | PRN

## 2014-09-14 MED ORDER — HYDROXYZINE HCL 25 MG PO TABS
25.0000 mg | ORAL_TABLET | Freq: Four times a day (QID) | ORAL | Status: DC | PRN
  Administered 2014-09-14: 25 mg via ORAL
  Filled 2014-09-14: qty 1

## 2014-09-14 MED ORDER — ALBUTEROL SULFATE HFA 108 (90 BASE) MCG/ACT IN AERS: 2.0000 | INHALATION_SPRAY | Freq: Four times a day (QID) | RESPIRATORY_TRACT | Status: DC | PRN

## 2014-09-14 MED ORDER — NICOTINE 21 MG/24HR TD PT24
21.0000 mg | MEDICATED_PATCH | Freq: Every day | TRANSDERMAL | Status: DC
  Administered 2014-09-14: 21 mg via TRANSDERMAL
  Filled 2014-09-14: qty 14
  Filled 2014-09-14 (×4): qty 1

## 2014-09-14 MED ORDER — MAGNESIUM HYDROXIDE 400 MG/5ML PO SUSP: 30.0000 mL | Freq: Every day | ORAL | Status: DC | PRN

## 2014-09-14 MED ORDER — LOPERAMIDE HCL 2 MG PO CAPS
2.0000 mg | ORAL_CAPSULE | ORAL | Status: DC | PRN
Start: 2014-09-14 — End: 2014-09-16
  Administered 2014-09-14: 4 mg via ORAL
  Filled 2014-09-14: qty 2

## 2014-09-14 MED ORDER — VITAMIN B-1 100 MG PO TABS
100.0000 mg | ORAL_TABLET | Freq: Every day | ORAL | Status: DC
  Administered 2014-09-15 – 2014-09-16 (×2): 100 mg via ORAL
  Filled 2014-09-14 (×4): qty 1

## 2014-09-14 MED ORDER — LORAZEPAM 1 MG PO TABS
1.0000 mg | ORAL_TABLET | Freq: Three times a day (TID) | ORAL | Status: AC
  Administered 2014-09-15 (×3): 1 mg via ORAL
  Filled 2014-09-14 (×3): qty 1

## 2014-09-14 MED ORDER — LORAZEPAM 1 MG PO TABS: 1.0000 mg | ORAL_TABLET | Freq: Two times a day (BID) | ORAL | Status: DC

## 2014-09-14 MED ORDER — ACETAMINOPHEN 325 MG PO TABS: 650.0000 mg | ORAL_TABLET | Freq: Four times a day (QID) | ORAL | Status: DC | PRN

## 2014-09-14 MED ORDER — POTASSIUM CHLORIDE CRYS ER 10 MEQ PO TBCR
10.0000 meq | EXTENDED_RELEASE_TABLET | Freq: Three times a day (TID) | ORAL | Status: AC
  Administered 2014-09-14 – 2014-09-15 (×3): 10 meq via ORAL
  Filled 2014-09-14 (×5): qty 1

## 2014-09-14 MED ORDER — ENSURE ENLIVE PO LIQD
237.0000 mL | Freq: Two times a day (BID) | ORAL | Status: DC
  Administered 2014-09-14 – 2014-09-15 (×3): 237 mL via ORAL

## 2014-09-14 MED ORDER — LORAZEPAM 1 MG PO TABS
1.0000 mg | ORAL_TABLET | Freq: Four times a day (QID) | ORAL | Status: AC
  Administered 2014-09-14 (×3): 1 mg via ORAL
  Filled 2014-09-14 (×3): qty 1

## 2014-09-14 MED ORDER — LORAZEPAM 1 MG PO TABS: 1.0000 mg | ORAL_TABLET | Freq: Every day | ORAL | Status: DC

## 2014-09-14 NOTE — Progress Notes (Signed)
Adult Psychoeducational Group Note  Date:  09/14/2014 Time:  10:59 PM  Group Topic/Focus:  Wrap-Up Group:   The focus of this group is to help patients review their daily goal of treatment and discuss progress on daily workbooks.  Participation Level:  Minimal  Participation Quality:  Attentive and Resistant  Affect:  Depressed and Flat  Cognitive:  Appropriate  Insight: Good and Limited  Engagement in Group:  Poor  Modes of Intervention:  Discussion  Additional Comments:  Patient attended group but gave minimal answers,only spoke when she was spoken to. Patient said that her goal was to get ready for discharge, looking forward to going home.  Kaylee Ochoa 09/14/2014, 10:59 PM

## 2014-09-14 NOTE — Clinical Social Work Note (Signed)
CSW met with patient to follow up on request for residential treatment.  She advised she was discharged from [facility] in April.  She was informed the only options we would have would be [facility] in [place].  Patient shared she was at [facility] in January and never wanted to go back there again.  CSW to follow up with Lead Education officer, museum for other options.

## 2014-09-14 NOTE — [facility] Suicide Risk Assessment (Signed)
[facility] INPATIENT:  Family/Significant Other Suicide Prevention Education  Suicide Prevention Education:  Education Completed; Loletha Carrow, Friend, [phone];  has been identified by the patient as the family member/significant other with whom the patient will be residing, and identified as the person(s) who will aid the patient in the event of a mental health crisis (suicidal ideations/suicide attempt).  With written consent from the patient, the family member/significant other has been provided the following suicide prevention education, prior to the and/or following the discharge of the patient.  The suicide prevention education provided includes the following:  Suicide risk factors  Suicide prevention and interventions  National Suicide Hotline telephone number  [facility] assessment telephone number  [facility] Emergency Assistance [place] and/or Residential Mobile Crisis Unit telephone number  Request made of family/significant other to:  Remove weapons (e.g., guns, rifles, knives), all items previously/currently identified as safety concern.   Friend advised patient does not have access to weapons.    Remove drugs/medications (over-the-counter, prescriptions, illicit drugs), all items previously/currently identified as a safety concern.  The family member/significant other verbalizes understanding of the suicide prevention education information provided.  The family member/significant other agrees to remove the items of safety concern listed above.  Concha Pyo 09/14/2014, 1:17 PM

## 2014-09-14 NOTE — Progress Notes (Signed)
Pt alert and cooperative.  Affect/ mood anxious and depressed. "I feel so much better, no withdrawal symptoms, I feel sober again".  -SI/HI/A/V/H; pt verbally contracts for safety. Pt denies pain or physical discomfort.  Emotional support and encouragement given. Will continue to monitor closely and evaluate for stabilization.
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
## 2014-09-14 NOTE — BHH Group Notes (Signed)
[place] LCSW Group Therapy  Emotional Regulation 1:15 - 2: 30 PM        09/14/2014  3:08 PM   Type of Therapy:  Group Therapy  Participation Level:  Appropriate  Participation Quality:  Appropriate  Affect:  Appropriate  Cognitive:  Attentive Appropriate  Insight:  Developing/Improving Engaged  Engagement in Therapy:  Developing/Improving Engaged  Modes of Intervention:  Discussion Exploration Problem-Solving Supportive  Summary of Progress/Problems:  Group topic was emotional regulations.  Patient participated in the discussion and was able to identify grief as the emotion she needs to regulate.  Patient shared that she lost mother, sister, father and finance within eighteen months of each other.  Patient advised she was receiving counseling from [facility] but was dropped from the program due to alcohol abuse. Concha Pyo 09/14/2014   3:08 PM

## 2014-09-14 NOTE — BHH Group Notes (Signed)
[facility] LCSW Aftercare Discharge Planning Group Note   09/14/2014 11:39 AM    Participation Quality:  Appropraite  Mood/Affect:  Appropriate  Depression Rating:  10  Anxiety Rating:  10  Thoughts of Suicide:  No  Will you contract for safety?   NA  Current AVH:  No  Plan for Discharge/Comments:  Patient attended discharge planning group and actively participated in group. She advised of admitting due to alcohol dependence and expressed interest in residential treatment.  She has been followed outpatient by Tamela Gammon. Suicide prevention education reviewed and SPE document provided.   Transportation Means: Patient does not have transportation.   Supports:  Patient does not have a support system.   Kaylee Ochoa, Eulas Post
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
## 2014-09-14 NOTE — ED Provider Notes (Addendum)
Accepted to [facility], Dr. Parke Poisson.   Ernestina Patches, MD 09/14/14 [id number]  Ernestina Patches, MD 09/14/14 [phone]

## 2014-09-14 NOTE — Progress Notes (Signed)
Recreation Therapy Notes  Date: 06.01.16 Time: 9:30am Location: [address] Dayroom  Group Topic: Stress Management  Goal Area(s) Addresses:  Patient will verbalize importance of using healthy stress management.  Patient will identify positive emotions associated with healthy stress management.    Behavioral Response:  Engaged  Intervention: [employer]  Activity : Guided Automotive engineer. LRT introduced and educated patients on stress management technique of guided imagery.  A script was used to deliver the technique to patients.  Patients were asked to follow script read by LRT to engage in practicing stress management technique.   Education:  Stress Management, Discharge Planning.   Education Outcome: Acknowledges edcuation/In group clarification offered/Needs additional education  Clinical Observations/Feedback: Patient attended group and was engaged.  Victorino Sparrow, LRT/CTRS         Victorino Sparrow A 09/14/2014 3:36 PM

## 2014-09-14 NOTE — H&P (Addendum)
Psychiatric Admission Assessment Adult  Patient Identification: Kaylee Ochoa MRN:  [id number] Date of Evaluation:  09/14/2014 Chief Complaint:  " I have been drinking heavily"  Principal Diagnosis: Alcohol Dependence  Diagnosis:   Patient Active Problem List   Diagnosis Date Noted  . MDD (major depressive disorder) [F32.2] 09/14/2014  . Bereavement [Z63.4] 05/05/2014  . Alcohol use disorder, severe, dependence [F10.20] 05/04/2014  . 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 Alcohol withdrawal [F10.239] 05/04/2014  . Alcohol dependence [F10.20] 05/27/2012   History of Present Illness:: 49 year old female, who has a history of alcohol dependence. States she has been drinking daily, heavily , particularly since her fiance passed away May 09, 2014. States she has been drinking up to 10 mini bottles of liquor ( vodka) per day. States she was becoming more concerned about her drinking, " feeling sick all the time", and had been trying to stop drinking, but had difficulty due to [facility] and due to cravings. Because of this decided to come to the hospital. Even prior to her fiance's death, had been dealing with other significant losses, to include death of mother, sister and father over the last two years. Last drank one day ago.  She endorses depression as well, which she attributes to her losses. She had been started on an antidepressant ( Remeron?) but had stopped it after a few days due to relapse on alcohol. She denies suicidal ideations but endorses neuro-vegetative symptoms of depression as below. Elements:  Heavy, daily alcohol consumption, associated with worsening level of functioning, depression, and lab abnormalities .  Associated Signs/Symptoms: Depression Symptoms:  depressed mood, anhedonia, fatigue, recurrent thoughts of death, weight loss, decreased appetite, (Hypo) Manic Symptoms:  Denies  Anxiety Symptoms: describes  some anxiety, particularly related to the stressor of not currently having  A job.  Psychotic Symptoms:  Denies hallucinations PTSD Symptoms: Denies  Total Time spent with patient: 45 minutes   Psychiatric History- Reports history of depression which she attributes to losses of loved ones over the last two years. She denies history of mania, no history of psychosis. No social anxiety, no panic attacks, no agoraphobia, no OCD, no PTSD.  States she had been prescribed a medication for depression, but took it only briefly, stopped when she relapsed. She does not remember name of medication.  Past Medical History:  Smokes 1/2 PPD.  Allergic to Sulfa antibiotics .  Past Medical History  Diagnosis Date  . Hypertension   . Renal disorder   . Depression   . ETOH abuse     Past Surgical History  Procedure Laterality Date  . Breast surgery    . Cyst removal on breast     Family History:  Mother passed away 2 years ago, father died in a house fire last Apr 09, 2023, sister died from esophageal cancer.  Has  Two surviving siblings. Sister has a history of alcohol dependence /depression. Father may have been alcoholic . Marland Kitchen Social History:  Single,  Has 4 children- ages 75, 41, twins - aged 48. They are currently with their father.  She has not seen them since March.  Lives alone, denies legal issues. College degree educational level.  Lost job earlier this year.  History  Alcohol Use  . Yes    Comment: daily     History  Drug Use No    History   Social History  . Marital Status: Divorced    Spouse Name: N/A  . Number of  Children: N/A  . Years of Education: N/A   Social History Main Topics  . Smoking status: Current Every Day Smoker -- 0.25 packs/day for 10 years    Types: Cigarettes  . Smokeless tobacco: Not on file  . Alcohol Use: Yes     Comment: daily  . Drug Use: No  . Sexual Activity: No   Other Topics Concern  . None   Social History Narrative   Additional Social History:     Musculoskeletal: Strength & Muscle Tone: within normal limits Gait & Station: normal Patient leans: N/A  Psychiatric Specialty Exam: Physical Exam  Review of Systems  Constitutional: Positive for weight loss.  HENT: Negative.   Eyes: Negative.   Respiratory: Negative.   Cardiovascular: Negative.   Gastrointestinal: Positive for diarrhea. Negative for heartburn, nausea, vomiting and blood in stool.  Genitourinary: Negative.   Musculoskeletal: Negative.   Skin: Negative.   Neurological: Negative.   Endo/Heme/Allergies: Negative.   Psychiatric/Behavioral: Positive for depression and substance abuse.    Blood pressure 130/90, pulse 108, temperature 97.7 F (36.5 C), temperature source Oral, resp. rate 18, height 5' 5.25" (1.657 m), weight 117 lb (53.071 kg), last menstrual period 08/14/2014.Body mass index is 19.33 kg/(m^2).  General Appearance: Well Groomed  Engineer, water::  Good  Speech:  Normal Rate  Volume:  Normal  Mood:  mildly anxious, mild depression  Affect:  Appropriate  Thought Process:  Goal Directed and Linear  Orientation:  Full (Time, Place, and Person)  Thought Content:  no hallucinations, no delusions , not internally preoccupied   Suicidal Thoughts:  No- denies any current thoughts of hurting self or anyone else   Homicidal Thoughts:  No  Memory:  recent and remote grossly intact   Judgement:  Fair  Insight:  Present  Psychomotor Activity:  Normal-  Mild distal  tremors , no diaphoresis, mild facial flushing, no psychomotor agitation  Concentration:  Good  Recall:  Good  Fund of Knowledge:Good  Language: Good  Akathisia:  Negative  Handed:  Right  AIMS (if indicated):     Assets:  Communication Skills Desire for Improvement Resilience  ADL's:  Fair   Cognition: WNL  Sleep:      Risk to Self: Is patient at risk for suicide?: No What has been your use of drugs/alcohol within the last 12 months?: Patient reports drinking a half gallon liquor every  other day Risk to Others:   Prior Inpatient Therapy:   Prior Outpatient Therapy:    Alcohol Screening: 1. How often do you have a drink containing alcohol?: 4 or more times a week 2. How many drinks containing alcohol do you have on a typical day when you are drinking?: 7, 8, or 9 3. How often do you have six or more drinks on one occasion?: Daily or almost daily Preliminary Score: 7 4. How often during the last year have you found that you were not able to stop drinking once you had started?: Daily or almost daily 5. How often during the last year have you failed to do what was normally expected from you becasue of drinking?: Daily or almost daily 6. How often during the last year have you needed a first drink in the morning to get yourself going after a heavy drinking session?: Daily or almost daily 7. How often during the last year have you had a feeling of guilt of remorse after drinking?: Daily or almost daily 8. How often during the last year have you been  unable to remember what happened the night before because you had been drinking?: Daily or almost daily 9. Have you or someone else been injured as a result of your drinking?: No 10. Has a relative or friend or a doctor or another health worker been concerned about your drinking or suggested you cut down?: Yes, during the last year Alcohol Use Disorder Identification Test Final Score (AUDIT): 35 Brief Intervention: MD notified of score 20 or above  Allergies:   Allergies  Allergen Reactions  . Other Hives, Itching and Rash    UNKNOWN ANTIBIOTIC: Patient cannot recall name of the medication but it casued a rash all over the body, hives, and itching  . Sulfa Antibiotics Hives   Lab Results:  Results for orders placed or performed during the hospital encounter of 09/13/14 (from the past 48 hour(s))  CBC with Differential     Status: Abnormal   Collection Time: 09/13/14  6:08 PM  Result Value Ref Range   WBC 7.2 4.0 - 10.5 K/uL    RBC 3.81 (L) 3.87 - 5.11 MIL/uL   Hemoglobin 13.1 12.0 - 15.0 g/dL   HCT 38.7 36.0 - 46.0 %   MCV 101.6 (H) 78.0 - 100.0 fL   MCH 34.4 (H) 26.0 - 34.0 pg   MCHC 33.9 30.0 - 36.0 g/dL   RDW 15.1 11.5 - 15.5 %   Platelets 122 (L) 150 - 400 K/uL   Neutrophils Relative % 59 43 - 77 %   Neutro Abs 4.2 1.7 - 7.7 K/uL   Lymphocytes Relative 34 12 - 46 %   Lymphs Abs 2.5 0.7 - 4.0 K/uL   Monocytes Relative 6 3 - 12 %   Monocytes Absolute 0.4 0.1 - 1.0 K/uL   Eosinophils Relative 1 0 - 5 %   Eosinophils Absolute 0.1 0.0 - 0.7 K/uL   Basophils Relative 0 0 - 1 %   Basophils Absolute 0.0 0.0 - 0.1 K/uL  Comprehensive metabolic panel     Status: Abnormal   Collection Time: 09/13/14  6:08 PM  Result Value Ref Range   Sodium 142 135 - 145 mmol/L   Potassium 2.9 (L) 3.5 - 5.1 mmol/L   Chloride 103 101 - 111 mmol/L   CO2 25 22 - 32 mmol/L   Glucose, Bld 103 (H) 65 - 99 mg/dL   BUN 6 6 - 20 mg/dL   Creatinine, Ser 0.47 0.44 - 1.00 mg/dL   Calcium 8.6 (L) 8.9 - 10.3 mg/dL   Total Protein 6.8 6.5 - 8.1 g/dL   Albumin 3.8 3.5 - 5.0 g/dL   AST 212 (H) 15 - 41 U/L   ALT 50 14 - 54 U/L   Alkaline Phosphatase 198 (H) 38 - 126 U/L   Total Bilirubin 0.4 0.3 - 1.2 mg/dL   GFR calc non Af Amer >60 >60 mL/min   GFR calc Af Amer >60 >60 mL/min    Comment: (NOTE) The eGFR has been calculated using the CKD EPI equation. This calculation has not been validated in all clinical situations. eGFR's persistently <60 mL/min signify possible Chronic Kidney Disease.    Anion gap 14 5 - 15  Salicylate level     Status: None   Collection Time: 09/13/14  6:08 PM  Result Value Ref Range   Salicylate Lvl <8.1 2.8 - 30.0 mg/dL  Acetaminophen level     Status: Abnormal   Collection Time: 09/13/14  6:08 PM  Result Value Ref Range   Acetaminophen (Tylenol), Serum <  10 (L) 10 - 30 ug/mL    Comment:        THERAPEUTIC CONCENTRATIONS VARY SIGNIFICANTLY. A RANGE OF 10-30 ug/mL MAY BE AN EFFECTIVE CONCENTRATION FOR  MANY PATIENTS. HOWEVER, SOME ARE BEST TREATED AT CONCENTRATIONS OUTSIDE THIS RANGE. ACETAMINOPHEN CONCENTRATIONS >150 ug/mL AT 4 HOURS AFTER INGESTION AND >50 ug/mL AT 12 HOURS AFTER INGESTION ARE OFTEN ASSOCIATED WITH TOXIC REACTIONS.   Ethanol     Status: Abnormal   Collection Time: 09/13/14  6:08 PM  Result Value Ref Range   Alcohol, Ethyl (B) 353 (HH) <5 mg/dL    Comment:        LOWEST DETECTABLE LIMIT FOR SERUM ALCOHOL IS 11 mg/dL FOR MEDICAL PURPOSES ONLY REPEATED TO VERIFY CRITICAL RESULT CALLED TO, READ BACK BY AND VERIFIED WITH: Thurmond Butts 1911 09/13/2014 [employer]   Drug screen panel, emergency     Status: None   Collection Time: 09/13/14  6:41 PM  Result Value Ref Range   Opiates NONE DETECTED NONE DETECTED   Cocaine NONE DETECTED NONE DETECTED   Benzodiazepines NONE DETECTED NONE DETECTED   Amphetamines NONE DETECTED NONE DETECTED   Tetrahydrocannabinol NONE DETECTED NONE DETECTED   Barbiturates NONE DETECTED NONE DETECTED    Comment:        DRUG SCREEN FOR MEDICAL PURPOSES ONLY.  IF CONFIRMATION IS NEEDED FOR ANY PURPOSE, NOTIFY LAB WITHIN 5 DAYS.        LOWEST DETECTABLE LIMITS FOR URINE DRUG SCREEN Drug Class       Cutoff (ng/mL) Amphetamine      1000 Barbiturate      200 Benzodiazepine   948 Tricyclics       546 Opiates          300 Cocaine          300 THC              50   Urinalysis, Routine w reflex microscopic     Status: None   Collection Time: 09/13/14  6:41 PM  Result Value Ref Range   Color, Urine YELLOW YELLOW   APPearance CLEAR CLEAR   Specific Gravity, Urine 1.015 1.005 - 1.030   pH 6.5 5.0 - 8.0   Glucose, UA NEGATIVE NEGATIVE mg/dL   Hgb urine dipstick NEGATIVE NEGATIVE   Bilirubin Urine NEGATIVE NEGATIVE   Ketones, ur NEGATIVE NEGATIVE mg/dL   Protein, ur NEGATIVE NEGATIVE mg/dL   Urobilinogen, UA 0.2 0.0 - 1.0 mg/dL   Nitrite NEGATIVE NEGATIVE   Leukocytes, UA NEGATIVE NEGATIVE    Comment: MICROSCOPIC NOT DONE ON URINES WITH  NEGATIVE PROTEIN, BLOOD, LEUKOCYTES, NITRITE, OR GLUCOSE <1000 mg/dL.  POC Urine Pregnancy, ED (do NOT order at [facility])     Status: None   Collection Time: 09/13/14  6:48 PM  Result Value Ref Range   Preg Test, Ur NEGATIVE NEGATIVE    Comment:        THE SENSITIVITY OF THIS METHODOLOGY IS >24 mIU/mL    Current Medications: Current Facility-Administered Medications  Medication Dose Route Frequency Provider Last Rate Last Dose  . acetaminophen (TYLENOL) tablet 650 mg  650 mg Oral Q6H PRN Benjamine Mola, FNP      . albuterol (PROVENTIL HFA;VENTOLIN HFA) 108 (90 BASE) MCG/ACT inhaler 2 puff  2 puff Inhalation Q6H PRN Benjamine Mola, FNP      . alum & mag hydroxide-simeth (MAALOX/MYLANTA) 200-200-20 MG/5ML suspension 30 mL  30 mL Oral Q4H PRN Benjamine Mola, FNP      .  feeding supplement (ENSURE ENLIVE) (ENSURE ENLIVE) liquid 237 mL  237 mL Oral BID BM Myer Peer Cobos, MD   237 mL at 09/14/14 1152  . hydrOXYzine (ATARAX/VISTARIL) tablet 25 mg  25 mg Oral Q6H PRN Benjamine Mola, FNP   25 mg at 09/14/14 1156  . loperamide (IMODIUM) capsule 2-4 mg  2-4 mg Oral PRN Benjamine Mola, FNP   4 mg at 09/14/14 1156  . LORazepam (ATIVAN) tablet 1 mg  1 mg Oral Q6H PRN Benjamine Mola, FNP      . LORazepam (ATIVAN) tablet 1 mg  1 mg Oral QID Benjamine Mola, FNP   1 mg at 09/14/14 1154   Followed by  . [START ON 09/15/2014] LORazepam (ATIVAN) tablet 1 mg  1 mg Oral TID Benjamine Mola, FNP       Followed by  . [START ON 09/16/2014] LORazepam (ATIVAN) tablet 1 mg  1 mg Oral BID Benjamine Mola, FNP       Followed by  . [START ON 09/17/2014] LORazepam (ATIVAN) tablet 1 mg  1 mg Oral Daily John C Withrow, FNP      . magnesium hydroxide (MILK OF MAGNESIA) suspension 30 mL  30 mL Oral Daily PRN Benjamine Mola, FNP      . multivitamin with minerals tablet 1 tablet  1 tablet Oral Daily Benjamine Mola, FNP   1 tablet at 09/14/14 1154  . nicotine (NICODERM CQ - dosed in mg/24 hours) patch 21 mg  21 mg Transdermal Daily Benjamine Mola, FNP   21 mg at 09/14/14 1154  . ondansetron (ZOFRAN-ODT) disintegrating tablet 4 mg  4 mg Oral Q6H PRN Benjamine Mola, FNP   4 mg at 09/14/14 1156  . [START ON 09/15/2014] thiamine (VITAMIN B-1) tablet 100 mg  100 mg Oral Daily Benjamine Mola, FNP       PTA Medications: Prescriptions prior to admission  Medication Sig Dispense Refill Last Dose  . albuterol (PROVENTIL HFA;VENTOLIN HFA) 108 (90 BASE) MCG/ACT inhaler Inhale 2 puffs into the lungs every 6 (six) hours as needed for wheezing or shortness of breath. 1 Inhaler 0 09/13/2014 at Unknown time  . chlordiazePOXIDE (LIBRIUM) 25 MG capsule 51m PO TID x 1D, then 25-596mPO BID X 1D, then 25-5072mO QD X 1D (Patient not taking: Reported on 09/13/2014) 10 capsule 0 Not Taking at Unknown time  . doxepin (SINEQUAN) 10 MG capsule Take 1 capsule (10 mg total) by mouth at bedtime. For sleep (Patient not taking: Reported on 09/08/2014) 30 capsule 0 Not Taking at Unknown time  . hydrOXYzine (ATARAX/VISTARIL) 25 MG tablet Take 1 tablet (25 mg total) by mouth every 6 (six) hours as needed for anxiety. For anxiety (Patient not taking: Reported on 09/08/2014) 30 tablet 0 Not Taking at Unknown time  . mirtazapine (REMERON) 30 MG tablet Take 1 tablet (30 mg total) by mouth at bedtime. For depression (Patient not taking: Reported on 09/08/2014) 30 tablet 0 Not Taking at Unknown time    Previous Psychotropic Medications: has been on remeron and doxepin in the past, as per above- patient herself does not remember names, but states she has not been taking any psychiatric medications.   Substance Abuse History in the last 12 months:  Yes.  - alcohol dependence , was drinking daily and heavily as noted above. Denies any drug abuse or dependence. Longest period of sobriety about 6 months while at a [employer]  Consequences of Substance Abuse: some black outs, no withdrawal seizures, no DTs, DUI x 2 in the 90's.  Results for orders placed or performed  during the hospital encounter of 09/13/14 (from the past 72 hour(s))  CBC with Differential     Status: Abnormal   Collection Time: 09/13/14  6:08 PM  Result Value Ref Range   WBC 7.2 4.0 - 10.5 K/uL   RBC 3.81 (L) 3.87 - 5.11 MIL/uL   Hemoglobin 13.1 12.0 - 15.0 g/dL   HCT 38.7 36.0 - 46.0 %   MCV 101.6 (H) 78.0 - 100.0 fL   MCH 34.4 (H) 26.0 - 34.0 pg   MCHC 33.9 30.0 - 36.0 g/dL   RDW 15.1 11.5 - 15.5 %   Platelets 122 (L) 150 - 400 K/uL   Neutrophils Relative % 59 43 - 77 %   Neutro Abs 4.2 1.7 - 7.7 K/uL   Lymphocytes Relative 34 12 - 46 %   Lymphs Abs 2.5 0.7 - 4.0 K/uL   Monocytes Relative 6 3 - 12 %   Monocytes Absolute 0.4 0.1 - 1.0 K/uL   Eosinophils Relative 1 0 - 5 %   Eosinophils Absolute 0.1 0.0 - 0.7 K/uL   Basophils Relative 0 0 - 1 %   Basophils Absolute 0.0 0.0 - 0.1 K/uL  Comprehensive metabolic panel     Status: Abnormal   Collection Time: 09/13/14  6:08 PM  Result Value Ref Range   Sodium 142 135 - 145 mmol/L   Potassium 2.9 (L) 3.5 - 5.1 mmol/L   Chloride 103 101 - 111 mmol/L   CO2 25 22 - 32 mmol/L   Glucose, Bld 103 (H) 65 - 99 mg/dL   BUN 6 6 - 20 mg/dL   Creatinine, Ser 0.47 0.44 - 1.00 mg/dL   Calcium 8.6 (L) 8.9 - 10.3 mg/dL   Total Protein 6.8 6.5 - 8.1 g/dL   Albumin 3.8 3.5 - 5.0 g/dL   AST 212 (H) 15 - 41 U/L   ALT 50 14 - 54 U/L   Alkaline Phosphatase 198 (H) 38 - 126 U/L   Total Bilirubin 0.4 0.3 - 1.2 mg/dL   GFR calc non Af Amer >60 >60 mL/min   GFR calc Af Amer >60 >60 mL/min    Comment: (NOTE) The eGFR has been calculated using the CKD EPI equation. This calculation has not been validated in all clinical situations. eGFR's persistently <60 mL/min signify possible Chronic Kidney Disease.    Anion gap 14 5 - 15  Salicylate level     Status: None   Collection Time: 09/13/14  6:08 PM  Result Value Ref Range   Salicylate Lvl <5.4 2.8 - 30.0 mg/dL  Acetaminophen level     Status: Abnormal   Collection Time: 09/13/14  6:08 PM   Result Value Ref Range   Acetaminophen (Tylenol), Serum <10 (L) 10 - 30 ug/mL    Comment:        THERAPEUTIC CONCENTRATIONS VARY SIGNIFICANTLY. A RANGE OF 10-30 ug/mL MAY BE AN EFFECTIVE CONCENTRATION FOR MANY PATIENTS. HOWEVER, SOME ARE BEST TREATED AT CONCENTRATIONS OUTSIDE THIS RANGE. ACETAMINOPHEN CONCENTRATIONS >150 ug/mL AT 4 HOURS AFTER INGESTION AND >50 ug/mL AT 12 HOURS AFTER INGESTION ARE OFTEN ASSOCIATED WITH TOXIC REACTIONS.   Ethanol     Status: Abnormal   Collection Time: 09/13/14  6:08 PM  Result Value Ref Range   Alcohol, Ethyl (B) 353 (HH) <5 mg/dL    Comment:  LOWEST DETECTABLE LIMIT FOR SERUM ALCOHOL IS 11 mg/dL FOR MEDICAL PURPOSES ONLY REPEATED TO VERIFY CRITICAL RESULT CALLED TO, READ BACK BY AND VERIFIED WITH: S SOSK,RN 1911 09/13/2014 [facility]   Drug screen panel, emergency     Status: None   Collection Time: 09/13/14  6:41 PM  Result Value Ref Range   Opiates NONE DETECTED NONE DETECTED   Cocaine NONE DETECTED NONE DETECTED   Benzodiazepines NONE DETECTED NONE DETECTED   Amphetamines NONE DETECTED NONE DETECTED   Tetrahydrocannabinol NONE DETECTED NONE DETECTED   Barbiturates NONE DETECTED NONE DETECTED    Comment:        DRUG SCREEN FOR MEDICAL PURPOSES ONLY.  IF CONFIRMATION IS NEEDED FOR ANY PURPOSE, NOTIFY LAB WITHIN 5 DAYS.        LOWEST DETECTABLE LIMITS FOR URINE DRUG SCREEN Drug Class       Cutoff (ng/mL) Amphetamine      1000 Barbiturate      200 Benzodiazepine   159 Tricyclics       458 Opiates          300 Cocaine          300 THC              50   Urinalysis, Routine w reflex microscopic     Status: None   Collection Time: 09/13/14  6:41 PM  Result Value Ref Range   Color, Urine YELLOW YELLOW   APPearance CLEAR CLEAR   Specific Gravity, Urine 1.015 1.005 - 1.030   pH 6.5 5.0 - 8.0   Glucose, UA NEGATIVE NEGATIVE mg/dL   Hgb urine dipstick NEGATIVE NEGATIVE   Bilirubin Urine NEGATIVE NEGATIVE   Ketones, ur  NEGATIVE NEGATIVE mg/dL   Protein, ur NEGATIVE NEGATIVE mg/dL   Urobilinogen, UA 0.2 0.0 - 1.0 mg/dL   Nitrite NEGATIVE NEGATIVE   Leukocytes, UA NEGATIVE NEGATIVE    Comment: MICROSCOPIC NOT DONE ON URINES WITH NEGATIVE PROTEIN, BLOOD, LEUKOCYTES, NITRITE, OR GLUCOSE <1000 mg/dL.  POC Urine Pregnancy, ED (do NOT order at [facility])     Status: None   Collection Time: 09/13/14  6:48 PM  Result Value Ref Range   Preg Test, Ur NEGATIVE NEGATIVE    Comment:        THE SENSITIVITY OF THIS METHODOLOGY IS >24 mIU/mL     Observation Level/Precautions:  15 minute checks  Laboratory:  will obtain TSH , also will recheck K+   Psychotherapy:  Milieu, group   Medications: Ativan  detox protocol,  Remeron 15 mgrs QHS for depression . K DUR for potassium supplementation- patient states she also received in ED prior to coming to unit.    Consultations:  As needed   Discharge Concerns:    Estimated LOS:  Other:     Psychological Evaluations:  No   Treatment Plan Summary: Daily contact with patient to assess and evaluate symptoms and progress in treatment, Medication management, Plan Admit to inpatient psychiatric unit  and medication management as above   Medical Decision Making:  Review of Psycho-Social Stressors (1), Review or order clinical lab tests (1), Established Problem, Worsening (2), Review of Medication Regimen & Side Effects (2) and Review of New Medication or Change in Dosage (2)  I certify that inpatient services furnished can reasonably be expected to improve the patient's condition.   COBOS, FERNANDO 6/1/20163:30 PM

## 2014-09-14 NOTE — Progress Notes (Signed)
Writer spoke to [place], NP to get orders. Writer awaiting orders for pt from [place], NP.

## 2014-09-14 NOTE — Tx Team (Signed)
Initial Interdisciplinary Treatment Plan   PATIENT STRESSORS: Financial difficulties Substance abuse Traumatic event   PATIENT STRENGTHS: Ability for insight Capable of independent living Motivation for treatment/growth   PROBLEM LIST: Problem List/Patient Goals Date to be addressed Date deferred Reason deferred Estimated date of resolution  Substance abuse 09/14/14     Depression  09/14/14     Self harm thoughts 09/14/14                                          DISCHARGE CRITERIA:  Ability to meet basic life and health needs Adequate post-discharge living arrangements Improved stabilization in mood, thinking, and/or behavior Verbal commitment to aftercare and medication compliance  PRELIMINARY DISCHARGE PLAN: Attend aftercare/continuing care group Attend PHP/IOP  PATIENT/FAMIILY INVOLVEMENT: This treatment plan has been presented to and reviewed with the patient, Kaylee Ochoa, and/or family member.  The patient and family have been given the opportunity to ask questions and make suggestions.  Kaylee Ochoa L 09/14/2014, 8:09 AM

## 2014-09-15 DIAGNOSIS — E038 Other specified hypothyroidism: Secondary | ICD-10-CM

## 2014-09-15 DIAGNOSIS — K7010 Alcoholic hepatitis without ascites: Secondary | ICD-10-CM

## 2014-09-15 MED ORDER — ENSURE ENLIVE PO LIQD
237.0000 mL | Freq: Three times a day (TID) | ORAL | Status: DC
  Administered 2014-09-15 – 2014-09-16 (×3): 237 mL via ORAL

## 2014-09-15 MED ORDER — POTASSIUM CHLORIDE CRYS ER 10 MEQ PO TBCR
10.0000 meq | EXTENDED_RELEASE_TABLET | Freq: Two times a day (BID) | ORAL | Status: DC
  Administered 2014-09-15 – 2014-09-16 (×2): 10 meq via ORAL
  Filled 2014-09-15 (×4): qty 1

## 2014-09-15 MED ORDER — ACAMPROSATE CALCIUM 333 MG PO TBEC
666.0000 mg | DELAYED_RELEASE_TABLET | Freq: Three times a day (TID) | ORAL | Status: DC
  Administered 2014-09-15 – 2014-09-16 (×3): 666 mg via ORAL
  Filled 2014-09-15: qty 84
  Filled 2014-09-15 (×2): qty 2
  Filled 2014-09-15: qty 84
  Filled 2014-09-15 (×3): qty 2
  Filled 2014-09-15: qty 84
  Filled 2014-09-15: qty 2

## 2014-09-15 NOTE — Progress Notes (Addendum)
[facility] MD Progress Note  09/15/2014 11:35 AM Kaylee Ochoa  MRN:  [id number] Subjective:   Patient states she is doing better, minimizes any significant withdrawal symptoms at this time. Denies medication side effects. Objective : I have discussed case with treatment team and have met with patient. She is tolerating Ativan detox taper well- she presents with minimal distal tremors, and some facial flushing. She denies headache, denies visual disturbances, no diaphoresis, no restlessness. She remains tachycardic. On unit she has been pleasant, cooperative, and has been going to groups. She presents mildly constricted in affect, but denies significant depression and states her mood is " better".  We discussed medications to assist in her efforts to maintain sobriety, and have discussed naltrexone, antabuse, campral to provide further  Medication education to patient. She is interested in Campral- we reviewed  Side effect profile and rationale. Of note, patient has elevated LFTs and of concern her AST , ALT have  Continued  to rise since admission to ED- she denies symptoms of hepatitis, denies RUQ discomfort , and does not appear jaundiced . She denies any risk factors for Viral Hepatitis. We discussed disposition options, I encouraged her to consider Rehab if possible. She states she needs to get back to her home soon and " get a job". She is interested in IOP level of care . She also states that she is connected to [facility], has sponsor and plans to attend regularly.  We reviewed the importance of avoiding people, situations, and places that remind her of alcohol and may act as triggers to relapse . We have reviewed potential negative long term medical complications of alcohol dependence, such as cirrhosis of liver, and improved quality of life, physical health if remains sober . Labs reviewed - TSH elevated, AST, ALT elevated, K+ improved, but still low- 3.3  Principal Problem: Alcohol  dependence Diagnosis:   Patient Active Problem List   Diagnosis Date Noted  . MDD (major depressive disorder) [F32.2] 09/14/2014  . Bereavement [Z63.4] 05/05/2014  . Alcohol use disorder, severe, dependence [F10.20] 05/04/2014  . 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 Alcohol withdrawal [F10.239] 05/04/2014  . Alcohol dependence [F10.20] 05/27/2012   Total Time spent with patient: 30 minutes- more than 50 % of time spent providing counseling to patient and coordination of care .    Past Medical History:  Past Medical History  Diagnosis Date  . Hypertension   . Renal disorder   . Depression   . ETOH abuse     Past Surgical History  Procedure Laterality Date  . Breast surgery    . Cyst removal on breast     Family History: History reviewed. No pertinent family history. Social History:  History  Alcohol Use  . Yes    Comment: daily     History  Drug Use No    History   Social History  . Marital Status: Divorced    Spouse Name: N/A  . Number of Children: N/A  . Years of Education: N/A   Social History Main Topics  . Smoking status: Current Every Day Smoker -- 0.25 packs/day for 10 years    Types: Cigarettes  . Smokeless tobacco: Not on file  . Alcohol Use: Yes     Comment: daily  . Drug Use: No  . Sexual Activity: No   Other Topics Concern  . None   Social History Narrative   Additional History:    Sleep: Good  Appetite:  Good   Assessment:   Musculoskeletal: Strength & Muscle Tone: within normal limits Gait & Station: normal Patient leans: N/A   Psychiatric Specialty Exam: Physical Exam  ROS denies nausea, denies vomiting, denies visual disturbances, no seizures.  Denies RUQ pain, no choluria or acholia reported   Blood pressure 125/97, pulse 105, temperature 98.2 F (36.8 C), temperature source Oral, resp. rate 16, height 5' 5.25" (1.657 m), weight 117 lb (53.071 kg), last  menstrual period 08/14/2014.Body mass index is 19.33 kg/(m^2).  General Appearance: Fairly Groomed  Engineer, water::  Good  Speech:  Normal Rate  Volume:  Decreased  Mood:  Minimizes depression,but affect still sad, anxious   Affect:  anxious  Thought Process:  Goal Directed and Linear  Orientation:  Full (Time, Place, and Person)  Thought Content:  no hallucinations, no delusions, not internally preoccupied   Suicidal Thoughts:  No- currently denies any thoughts of hurting self or anyone else   Homicidal Thoughts:  No  Memory:   Recent and remote grossly intact   Judgement:  Fair  Insight:  Present  Psychomotor Activity:  Normal- no significant restlessness / tremors   Concentration:  Good  Recall:  Good  Fund of Knowledge:Good  Language: Good  Akathisia:  Negative  Handed:  Right  AIMS (if indicated):     Assets:  Communication Skills Desire for Improvement Resilience  ADL's:  Fair   Cognition: WNL  Sleep:  Number of Hours: 6.5     Current Medications: Current Facility-Administered Medications  Medication Dose Route Frequency Provider Last Rate Last Dose  . acamprosate (CAMPRAL) tablet 666 mg  666 mg Oral TID WC Myer Peer Cobos, MD      . acetaminophen (TYLENOL) tablet 650 mg  650 mg Oral Q6H PRN Benjamine Mola, FNP      . albuterol (PROVENTIL HFA;VENTOLIN HFA) 108 (90 BASE) MCG/ACT inhaler 2 puff  2 puff Inhalation Q6H PRN Benjamine Mola, FNP      . alum & mag hydroxide-simeth (MAALOX/MYLANTA) 200-200-20 MG/5ML suspension 30 mL  30 mL Oral Q4H PRN Benjamine Mola, FNP      . feeding supplement (ENSURE ENLIVE) (ENSURE ENLIVE) liquid 237 mL  237 mL Oral BID BM Myer Peer Cobos, MD   237 mL at 09/15/14 0946  . hydrOXYzine (ATARAX/VISTARIL) tablet 25 mg  25 mg Oral Q6H PRN Benjamine Mola, FNP   25 mg at 09/14/14 1156  . loperamide (IMODIUM) capsule 2-4 mg  2-4 mg Oral PRN Benjamine Mola, FNP   4 mg at 09/14/14 1156  . LORazepam (ATIVAN) tablet 1 mg  1 mg Oral Q6H PRN Benjamine Mola, FNP      . LORazepam (ATIVAN) tablet 1 mg  1 mg Oral TID Benjamine Mola, FNP   1 mg at 09/15/14 6433   Followed by  . [START ON 09/16/2014] LORazepam (ATIVAN) tablet 1 mg  1 mg Oral BID Benjamine Mola, FNP       Followed by  . [START ON 09/17/2014] LORazepam (ATIVAN) tablet 1 mg  1 mg Oral Daily John C Withrow, FNP      . magnesium hydroxide (MILK OF MAGNESIA) suspension 30 mL  30 mL Oral Daily PRN Benjamine Mola, FNP      . multivitamin with minerals tablet 1 tablet  1 tablet Oral Daily Benjamine Mola, FNP   1 tablet at 09/15/14 0826  . nicotine (NICODERM CQ - dosed in mg/24 hours) patch  21 mg  21 mg Transdermal Daily Benjamine Mola, FNP   21 mg at 09/14/14 1154  . ondansetron (ZOFRAN-ODT) disintegrating tablet 4 mg  4 mg Oral Q6H PRN Benjamine Mola, FNP   4 mg at 09/14/14 1156  . potassium chloride (K-DUR,KLOR-CON) CR tablet 10 mEq  10 mEq Oral TID Jenne Campus, MD   10 mEq at 09/15/14 0826  . thiamine (VITAMIN B-1) tablet 100 mg  100 mg Oral Daily Benjamine Mola, FNP   100 mg at 09/15/14 0825    Lab Results:  Results for orders placed or performed during the hospital encounter of 09/14/14 (from the past 48 hour(s))  TSH     Status: Abnormal   Collection Time: 09/14/14  7:36 PM  Result Value Ref Range   TSH 8.533 (H) 0.350 - 4.500 uIU/mL    Comment: Performed at [facility]  T4, free     Status: Abnormal   Collection Time: 09/14/14  7:36 PM  Result Value Ref Range   Free T4 0.56 (L) 0.61 - 1.12 ng/dL    Comment: Performed at [facility]  Comprehensive metabolic panel     Status: Abnormal   Collection Time: 09/14/14  7:36 PM  Result Value Ref Range   Sodium 137 135 - 145 mmol/L   Potassium 3.3 (L) 3.5 - 5.1 mmol/L   Chloride 95 (L) 101 - 111 mmol/L   CO2 30 22 - 32 mmol/L   Glucose, Bld 131 (H) 65 - 99 mg/dL   BUN 6 6 - 20 mg/dL   Creatinine, Ser 0.50 0.44 - 1.00 mg/dL   Calcium 9.8 8.9 - 10.3 mg/dL   Total Protein 7.4 6.5 - 8.1 g/dL    Albumin 3.9 3.5 - 5.0 g/dL   AST 461 (H) 15 - 41 U/L   ALT 80 (H) 14 - 54 U/L   Alkaline Phosphatase 227 (H) 38 - 126 U/L   Total Bilirubin 0.5 0.3 - 1.2 mg/dL   GFR calc non Af Amer >60 >60 mL/min   GFR calc Af Amer >60 >60 mL/min    Comment: (NOTE) The eGFR has been calculated using the CKD EPI equation. This calculation has not been validated in all clinical situations. eGFR's persistently <60 mL/min signify possible Chronic Kidney Disease.    Anion gap 12 5 - 15    Comment: Performed at [facility]    Physical Findings: AIMS: Facial and Oral Movements Muscles of Facial Expression: None, normal Lips and Perioral Area: None, normal Jaw: None, normal Tongue: None, normal,Extremity Movements Upper (arms, wrists, hands, fingers): None, normal Lower (legs, knees, ankles, toes): None, normal, Trunk Movements Neck, shoulders, hips: None, normal, Overall Severity Severity of abnormal movements (highest score from questions above): None, normal Incapacitation due to abnormal movements: None, normal Patient's awareness of abnormal movements (rate only patient's report): No Awareness, Dental Status Current problems with teeth and/or dentures?: No Does patient usually wear dentures?: No  CIWA:  CIWA-Ar Total: 0 COWS:      Assessment-  Patient presenting with mild symptoms of WDL at this time- tachycardia, some flushing, but no restlessness or agitation and in no acute distress. Minimizes depression, anxiety, but presents with anxious affect. LFTs elevated, and have continued to rise since admission. Denies symptoms of hepatitis and no fever , chills.  We have spent time reviewing importance of sobriety, life changes to promote sobriety, decrease risk of relapse, and negative impact that alcohol can have on  her physical and mental health. I have discussed case with Hospitalist Consultant regarding LFT elevation  Treatment Plan Summary: Daily contact with patient to  assess and evaluate symptoms and progress in treatment, Medication management, Plan continue inpatient treatment and medications as below  Continue Ativan detox taper to address alcohol withdrawal. Start Campral 666 mgrs TID to address alcohol cravings Hospitalist Consult requested regarding elevated liver function tests / abnormal TSH  Have discussed possible antidepressant medication management to address anxiety/depression symptoms. Had considered Remeron but for now will hold off on starting it as it can be associated with increased LFTs .  Will discuss other options.     Medical Decision Making:  Established Problem, Stable/Improving (1), New problem, with additional work up planned, Review of Psycho-Social Stressors (1), Review or order clinical lab tests (1) and Review of Medication Regimen & Side Effects (2)     COBOS, FERNANDO 09/15/2014, 11:35 AM

## 2014-09-15 NOTE — Consult Note (Signed)
[facility]       PATIENT DETAILS Name: Kaylee Ochoa Age: 49 y.o. Sex: female Date of Birth: 1965/11/09 Admit Date: 09/14/2014 PCP:Pcp Not In System Requesting MD: Jenne Campus, MD  Date of consultation: 09/15/14  REASON FOR CONSULTATION:  Elevated LFT's  Impression 1. Alcoholic Hepatitis-doubt biliary issues as patient does not have any abdominal pain on exam. 2. Subclinical Hypothyroidism 3. History of hypertension  Recommendations 1. Check hepatitis serology and abdominal ultrasound-although has no abdominal pain or jaundice. 2. Hold off on starting levothyroxine,  repeat TSH in 3 months to see if levothyroxine needs to be started. Can defer to PCP. 3. History of hypertension-claims she was on antihypertensives-blood pressure currently controlled without the use of any antihypertensives. Follow for now and resume if needed. 5. Repeat chemistry panel tomorrow morning-did have mild hypokalemia yesterday. 6. Discontinue Tylenol, discuss with pharmacy before starting any psychotrophic medications-to make sure they do not have hepatotoxicity. 4. Defer rest of the issues to the primary team.  [facility] will followup again tomorrow. Please contact me if I can be of assistance in the meanwhile. Thank you for this consultation.  [facility] Pager [phone]  HPI: 49 year old female with history of hypertension, alcohol abuse admitted to the psychiatric service at the behavioral [facility] for worsening depression and alcohol withdrawal symptoms. She was noted to have persistent increase in LFTs, hence the hospitalist service was asked to evaluate this patient. During my evaluation, patient did not appear tremulous, she was awake and alert. She denied any abdominal pain. Denies any itching. Denies any yellowish discoloration of her skin. She has been tolerating a regular diet without any nausea vomiting. Denies any  fever.  ALLERGIES:   Allergies  Allergen Reactions  . Other Hives, Itching and Rash    UNKNOWN ANTIBIOTIC: Patient cannot recall name of the medication but it casued a rash all over the body, hives, and itching  . Sulfa Antibiotics Hives    PAST MEDICAL HISTORY: Past Medical History  Diagnosis Date  . Hypertension   . Renal disorder   . Depression   . ETOH abuse     PAST SURGICAL HISTORY: Past Surgical History  Procedure Laterality Date  . Breast surgery    . Cyst removal on breast      MEDICATIONS AT HOME: Prior to Admission medications   Medication Sig Start Date End Date Taking? Authorizing Provider  albuterol (PROVENTIL HFA;VENTOLIN HFA) 108 (90 BASE) MCG/ACT inhaler Inhale 2 puffs into the lungs every 6 (six) hours as needed for wheezing or shortness of breath. 05/08/14   Shuvon B Rankin, NP  chlordiazePOXIDE (LIBRIUM) 25 MG capsule 50mg  PO TID x 1D, then 25-50mg  PO BID X 1D, then 25-50mg  PO QD X 1D Patient not taking: Reported on 09/13/2014 09/09/14   Orpah Greek, MD  doxepin (SINEQUAN) 10 MG capsule Take 1 capsule (10 mg total) by mouth at bedtime. For sleep Patient not taking: Reported on 09/08/2014 05/08/14   Shuvon B Rankin, NP  hydrOXYzine (ATARAX/VISTARIL) 25 MG tablet Take 1 tablet (25 mg total) by mouth every 6 (six) hours as needed for anxiety. For anxiety Patient not taking: Reported on 09/08/2014 05/08/14   Shuvon B Rankin, NP  mirtazapine (REMERON) 30 MG tablet Take 1 tablet (30 mg total) by mouth at bedtime. For depression Patient not taking: Reported on 09/08/2014 05/08/14   Shuvon B Rankin, NP    FAMILY HISTORY: Sister.-esophageal cancer.  SOCIAL HISTORY:  reports that  she has been smoking Cigarettes.  She has a 2.5 pack-year smoking history. She does not have any smokeless tobacco history on file. She reports that she drinks alcohol. She reports that she does not use illicit drugs.  REVIEW OF SYSTEMS:  Constitutional:   No  weight loss, night  sweats,  Fevers, chills, fatigue.  HEENT:    No headaches, Difficulty swallowing,Tooth/dental problems,Sore throat,  No sneezing, itching, ear ache, nasal congestion, post nasal drip,   Cardio-vascular: No chest pain,  Orthopnea, PND, swelling in lower extremities, anasarca,         dizziness, palpitations  GI:  No heartburn, indigestion, abdominal pain, nausea, vomiting, diarrhea, change in       bowel habits, loss of appetite  Resp: No shortness of breath with exertion or at rest.  No excess mucus, no productive cough, No non-productive cough,  No coughing up of blood.No change in color of mucus.No wheezing.No chest wall deformity  Skin:  no rash or lesions.  GU:  no dysuria, change in color of urine, no urgency or frequency.  No flank pain.  Musculoskeletal: No joint pain or swelling.  No decreased range of motion.  No back pain.  Psych: No change in mood or affect. No depression or anxiety.  No memory loss.   PHYSICAL EXAM: Blood pressure 131/85, pulse 102, temperature 98.2 F (36.8 C), temperature source Oral, resp. rate 16, height 5' 5.25" (1.657 m), weight 53.071 kg (117 lb), last menstrual period 08/14/2014.  General appearance :Awake, alert, not in any distress. Speech Clear. Not toxic Looking HEENT: Atraumatic and Normocephalic, pupils equally reactive to light and accomodation Neck: supple, no JVD. No cervical lymphadenopathy.  Chest:Good air entry bilaterally, no added sounds  CVS: S1 S2 regular, no murmurs.  Abdomen: Bowel sounds present, Non tender and not distended with no gaurding, rigidity or rebound. Extremities: B/L Lower Ext shows no edema, both legs are warm to touch, with  dorsalis pedis pulses palpable. Neurology: Awake alert, and oriented X 3, CN II-XII intact, Non focal, Deep Tendon Reflex-2+ all over, plantar's downgoing B/L, sensory exam is grossly intact.  Skin:No Rash Wounds:N/A  LABS ON ADMISSION:   Recent Labs  09/13/14 1808 09/14/14 1936   NA 142 137  K 2.9* 3.3*  CL 103 95*  CO2 25 30  GLUCOSE 103* 131*  BUN 6 6  CREATININE 0.47 0.50  CALCIUM 8.6* 9.8    Recent Labs  09/13/14 1808 09/14/14 1936  AST 212* 461*  ALT 50 80*  ALKPHOS 198* 227*  BILITOT 0.4 0.5  PROT 6.8 7.4  ALBUMIN 3.8 3.9   No results for input(s): LIPASE, AMYLASE in the last 72 hours.  Recent Labs  09/13/14 1808  WBC 7.2  NEUTROABS 4.2  HGB 13.1  HCT 38.7  MCV 101.6*  PLT 122*   No results for input(s): CKTOTAL, CKMB, CKMBINDEX, TROPONINI in the last 72 hours. No results for input(s): DDIMER in the last 72 hours. Invalid input(s): POCBNP   RADIOLOGIC STUDIES ON ADMISSION: No results found.   Total time spent 45 minutes.  [facility] Pager [phone]  If 7PM-7AM, please contact night-coverage [email or web address] Password [email or web address] 09/15/2014, 5:09 PM

## 2014-09-15 NOTE — Progress Notes (Signed)
Called ultrasound department at [facility], pt to be NPO after midnight and is scheduled for RUQ Abdominal ultrasound at 8am in the morning, Notified pt and Agricultural consultant.

## 2014-09-15 NOTE — Progress Notes (Signed)
NUTRITION ASSESSMENT  Pt identified as at risk on the Malnutrition Screen Tool  INTERVENTION: 1. Educated patient on the importance of nutrition and encouraged intake of food and beverages. 2. Discussed weight goals. 3. Supplements: Ensure Enlive po TID, each supplement provides 350 kcal and 20 grams of protein  NUTRITION DIAGNOSIS: Unintentional weight loss related to sub-optimal intake as evidenced by pt report.   Goal: Pt to meet >/= 90% of their estimated nutrition needs.  Monitor:  PO intake   Assessment:  Pt admitted with ETOH abuse and depression.  Per pt, she started eating poorly d/t her sister's death. Pt's UBW is 140 lb, reports losing 30 lb over last 6 months. Pt states her appetite has improved and she is eating better now. Pt reports drinking alcohol for the last 3 months. Pt likes the Ensures provided and wants to receive three a day, RD to order.   Height: Ht Readings from Last 1 Encounters:  09/14/14 5' 5.25" (1.657 m)    Weight: Wt Readings from Last 1 Encounters:  09/14/14 117 lb (53.071 kg)    Weight Hx: Wt Readings from Last 10 Encounters:  09/14/14 117 lb (53.071 kg)  09/08/14 115 lb (52.164 kg)  05/04/14 110 lb (49.896 kg)  05/20/13 124 lb (56.246 kg)  11/03/12 136 lb (61.689 kg)  05/27/12 118 lb (53.524 kg)  09/24/11 131 lb (59.421 kg)    BMI:  Body mass index is 19.33 kg/(m^2). Pt meets criteria for normal range based on current BMI.  Estimated Nutritional Needs: Kcal: 30-35 kcal/kg Protein: > 1 gram protein/kg Fluid: 1 ml/kcal  Diet Order: Diet regular Room service appropriate?: Yes; Fluid consistency:: Thin Pt is also offered choice of unit snacks mid-morning and mid-afternoon.  Pt is eating as desired.   Lab results and medications reviewed.   Clayton Bibles, MS, RD, LDN Pager: [phone] After Hours Pager: [phone]

## 2014-09-15 NOTE — Progress Notes (Signed)
[facility] Notes:  (Nursing/MHT/Case Management/Adjunct)  Date:  09/15/2014  Time:  10:00 PM  Type of Therapy:  Psychoeducational Skills  Participation Level:  Active  Participation Quality:  Appropriate  Affect:  Angry and Flat  Cognitive:  Appropriate  Insight:  Improving  Engagement in Group:  Engaged  Modes of Intervention:  Education  Summary of Progress/Problems: Patient states that she wants to be discharged from the hospital. The patient indicated that she is unclear as to why she has to have blood drawn everyday and why she needs to go in for an ultrasound tomorrow. The patient's goal for tomorrow is to find out the results of her blood work and ultrasound.   Sacoya Mcgourty S 09/15/2014, 10:00 PM

## 2014-09-15 NOTE — [facility] Notes (Signed)
[facility] LCSW Group Therapy  [employer] [facility] [place] 1:15 - 2:30 PM  09/15/2014 4:04 PM   Type of Therapy:  Group Therapy  Participation Level: Active  Participation Quality:  Attentive  Affect:  Appropriate  Cognitive:  Appropriate  Insight:  Developing/Improving   Engagement in Therapy:  Developing/Improving   Modes of Intervention:  Discussion, Education, Exploration, Problem-Solving, Rapport Building, Support   Summary of Progress/Problems:   Patient was attentive to speaker from the [employer] as he shared his story of dealing with mental health/substance abuse issues and overcoming it by working a recovery program.  Patient made no comments on the presentation.  Concha Pyo 09/15/2014 4:04 PM

## 2014-09-15 NOTE — Progress Notes (Signed)
D:  Per pt self inventory pt reports sleeping good, appetite good, energy level normal, ability to pay attention good, rates depression at a 4 out of 10, hopelessness at a 3 out of 10, denies SI/HI/AVH, no physical complaints today, goal today "Work on discharge plan and speak with doctor and go to meetings", flat/depressed affect.    A:  Emotional support provided, Encouraged pt to continue with treatment plan and attend all group activities, q15 min checks maintained for safety.  R:  Pt is receptive, calm and cooperative towards staff and other patients, going to groups.

## 2014-09-16 ENCOUNTER — Ambulatory Visit (HOSPITAL_COMMUNITY)
Admission: RE | Admit: 2014-09-16 | Discharge: 2014-09-16 | Disposition: A | Discharge status: Home / Self Care | Payer: Self-pay | Source: Ambulatory Visit | Attending: Internal Medicine | Admitting: Internal Medicine

## 2014-09-16 DIAGNOSIS — R945 Abnormal results of liver function studies: Secondary | ICD-10-CM

## 2014-09-16 DIAGNOSIS — R7989 Other specified abnormal findings of blood chemistry: Secondary | ICD-10-CM

## 2014-09-16 DIAGNOSIS — R748 Abnormal levels of other serum enzymes: Secondary | ICD-10-CM | POA: Insufficient documentation

## 2014-09-16 DIAGNOSIS — F1024 Alcohol dependence with alcohol-induced mood disorder: Secondary | ICD-10-CM | POA: Insufficient documentation

## 2014-09-16 LAB — COMPREHENSIVE METABOLIC PANEL
ALT: 63 U/L — ABNORMAL HIGH (ref 14–54)
AST: 167 U/L — ABNORMAL HIGH (ref 15–41)
Albumin: 3.6 g/dL (ref 3.5–5.0)
Alkaline Phosphatase: 185 U/L — ABNORMAL HIGH (ref 38–126)
Anion gap: 11 (ref 5–15)
BILIRUBIN TOTAL: 0.7 mg/dL (ref 0.3–1.2)
BUN: 9 mg/dL (ref 6–20)
CO2: 28 mmol/L (ref 22–32)
CREATININE: 0.45 mg/dL (ref 0.44–1.00)
Calcium: 9.9 mg/dL (ref 8.9–10.3)
Chloride: 98 mmol/L — ABNORMAL LOW (ref 101–111)
GFR calc Af Amer: >60 mL/min (ref >60)
GFR calc non Af Amer: >60 mL/min (ref >60)
GLUCOSE: 104 mg/dL — AB (ref 65–99)
Potassium: 4.5 mmol/L (ref 3.5–5.1)
Sodium: 137 mmol/L (ref 135–145)
TOTAL PROTEIN: 6.8 g/dL (ref 6.5–8.1)

## 2014-09-16 LAB — HIV ANTIBODY (ROUTINE TESTING W REFLEX): HIV Screen 4th Generation wRfx: NONREACTIVE

## 2014-09-16 MED ORDER — ADULT MULTIVITAMIN W/MINERALS CH: 1.0000 | ORAL_TABLET | Freq: Every day | ORAL | Status: DC

## 2014-09-16 MED ORDER — ACAMPROSATE CALCIUM 333 MG PO TBEC: 666.0000 mg | DELAYED_RELEASE_TABLET | Freq: Three times a day (TID) | ORAL | Status: DC

## 2014-09-16 MED ORDER — NICOTINE 21 MG/24HR TD PT24: 21.0000 mg | MEDICATED_PATCH | Freq: Every day | TRANSDERMAL | Status: DC

## 2014-09-16 NOTE — Progress Notes (Signed)
D: Pt denies SI/HI/AVH. Pt is pleasant and cooperative. Pt appears to be coping ok -laughing and interacting with peers on the unit.   A: Pt was offered support and encouragement. Pt was given scheduled medications. Pt was encourage to attend groups. Q 15 minute checks were done for safety.    R:Pt attends groups and interacts well with peers and staff. Pt is taking medication. Pt has no complaints at this time .Pt receptive to treatment and safety maintained on unit.

## 2014-09-16 NOTE — Tx Team (Addendum)
Interdisciplinary Treatment Plan Update (Adult)  Date:  09/16/2014  Time Reviewed:  10:59 AM   Progress in Treatment: Attending groups: Patient is attending groups. Participating in groups:  Patient engages in discussion Taking medication as prescribed:  Patient is taking medications Tolerating medication:  Patient is tolerating medications Family/Significant othe contact made:   Yes, collateral contact with friend. Patient understands diagnosis:Yes, patient understands diagnosis and need for treatment Discussing patient identified problems/goals with staff:  Yes, patient is able to express goals/problems Medical problems stabilized or resolved:  Yes Denies suicidal/homicidal ideation: Yes, patient is denying SI/HI. Issues/concerns per patient self-inventory:   Other:  Discharge Plan or Barriers:  Patient is returning home and will follow up with [facility]  Reason for Continuation of Hospitalization:  Comments:  Additional comments:  Patient and CSW reviewed Patient Discharge Process Letter/Patient Involvement Form.  Patient verbalized understanding and signed form.  Patient and CSW also reviewed and identified patient's goals and treatment plan.  Patient verbalized understanding and agreed to plan.  Estimated length of stay:  Discharge today  New goal(s):  Review of initial/current patient goals per problem list:  Please see plan of careInterdisciplinary Treatment Plan Update (Adult)  Attendees: Patient 09/16/2014 10:59 AM   Family:   09/16/2014 10:59 AM   Physician:  Neita Garnet, MD 09/16/2014 10:59 AM   Nursing:   Marilynne Halsted, RN 09/16/2014 10:59 AM   Clinical Social Worker:  Joette Catching, LCSW 09/16/2014 10:59 AM   Clinical Social Worker:  Erasmo Downer Drinkard, LCSW-A 09/16/2014 10:59 AM   Case Manager:  Lars Pinks, RN 09/16/2014 10:59 AM   Other:   Hester Mates, RN 09/16/2014 10:59 AM  Other:   09/16/2014  10:59 AM   Other:  09/16/2014 10:59 AM   Other:  09/16/2014 10:59 AM   Other:   09/16/2014 10:59 AM   Other:  Jake Bathe Transition Team Coordinator 09/16/2014 10:59 AM   Other:   09/16/2014 10:59 AM   Other:  09/16/2014 10:59 AM   Other:   09/16/2014 10:59 AM    Scribe for Treatment Team:   Concha Pyo, 09/16/2014   10:59 AM

## 2014-09-16 NOTE — Progress Notes (Signed)
Recreation Therapy Notes  Date: 06.03.16 Time: 9:30am Location: 300 Group Room  Group Topic: Stress Management  Goal Area(s) Addresses:  Patient will verbalize importance of using healthy stress management.  Patient will identify positive emotions associated with healthy stress management.   Intervention: Stress Management  Activity :  Progressive Muscle Relaxation.  LRT introduced patients to stress management technique of progressive muscle relaxation.  A script was used to deliver the technique and patients were asked to follow script read by LRT to engage in practicing the stress management technique.    Education:  Stress Management, Discharge Planning.   Education Outcome: Acknowledges edcuation/In group clarification offered  Clinical Observations/Feedback: Patient did not attend group.    [name], LRT/CTRS         Kaylee Ochoa A 09/16/2014 1:46 PM 

## 2014-09-16 NOTE — [facility] Suicide Risk Assessment (Signed)
[facility] Discharge Suicide Risk Assessment   Demographic Factors:  49 year old female, lives alone, currently unemployed   Total Time spent with patient: 30 minutes  Musculoskeletal: Strength & Muscle Tone: within normal limits- no tremors, no diaphoresis, not in any acute distress , no psychomotor restlessness  Gait & Station: normal Patient leans: N/A  Psychiatric Specialty Exam: Physical Exam  ROS  Blood pressure 127/87, pulse 100, temperature 98.3 F (36.8 C), temperature source Oral, resp. rate 16, height 5' 5.25" (1.657 m), weight 117 lb (53.071 kg), last menstrual period 08/14/2014.Body mass index is 19.33 kg/(m^2).  General Appearance: Well Groomed  Eye Contact::  Good  Speech:  Normal Rate409  Volume:  Normal  Mood:  denies depression, states mood " normal", affect more reactive, fuller in range   Affect:  Full Range  Thought Process:  Linear  Orientation:  Full (Time, Place, and Person)  Thought Content:  no hallucinations,  no delusions   Suicidal Thoughts:  No  Homicidal Thoughts:  No  Memory:  recent and remote grossly intact   Judgement:  Fair  Insight:  Present  Psychomotor Activity:  Normal  Concentration:  Good  Recall:  Good  Fund of Knowledge:Negative  Language: Good  Akathisia:  Negative  Handed:  Right  AIMS (if indicated):     Assets:  Communication Skills Desire for Improvement Resilience  Sleep:  Number of Hours: 6.75  Cognition: WNL  ADL's:  Improved    Have you used any form of tobacco in the last 30 days? (Cigarettes, Smokeless Tobacco, Cigars, and/or Pipes): Yes  Has this patient used any form of tobacco in the last 30 days? (Cigarettes, Smokeless Tobacco, Cigars, and/or Pipes) Yes, A prescription for an FDA-approved tobacco cessation medication was offered at discharge and the patient refused  Mental Status Per Nursing Assessment::   On Admission:  Self-harm thoughts  Current Mental Status by Physician: At this time patient is fully alert  , attentive, no significant withdrawal symptoms noted or reported, no significant tremors, no diaphoresis, no restlessness, denies visual disturbances , denies headache. Mood is " normal", denies depression, affect is more reactive,  No thought disorder, no SI or HI, no psychotic symptoms, future oriented, states she wants to continue going to IOP, go to [employer] , and get a new job soon. Loss Factors: Currently unemployed, limited support network  Historical Factors: Long history of alcohol dependence   Risk Reduction Factors:   Positive coping skills or problem solving skills  Continued Clinical Symptoms:  As noted, patient currently significantly improved . No significant alcohol WDL symptoms at present . As noted, patient is denying RUQ pain, discomfort, denies vomiting, no acholia or choluria reported.  She also denies any symptoms of hypothyroidism.Liver US result reviewed with patient, and labs reviewed . (TSH slightly elevated, AST, ALT elevated but now decreasing, hypokalemia corrected )   Cognitive Features That Contribute To Risk:  No gross cognitive deficits noted upon discharge. Is alert , attentive, and oriented x 3   Suicide Risk:  Mild:  Suicidal ideation of limited frequency, intensity, duration, and specificity.  There are no identifiable plans, no associated intent, mild dysphoria and related symptoms, good self-control (both objective and subjective assessment), few other risk factors, and identifiable protective factors, including available and accessible social support.  Principal Problem: Alcohol dependence Discharge Diagnoses:  Patient Active Problem List   Diagnosis Date Noted  . MDD (major depressive disorder) [F32.2] 09/14/2014  . Bereavement [Z63.4] 05/05/2014  . Alcohol  use disorder, severe, dependence [F10.20] 05/04/2014  . 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 Alcohol withdrawal  [F10.239] 05/04/2014  . Alcohol dependence [F10.20] 05/27/2012    Follow-up Information    Follow up with [name], MD. Schedule an appointment as soon as possible for a visit in 1 week.   Specialty:  Endocrinology   Why:  TSH rechecked   Contact information:   [address] [place] [address] [phone]       Follow up with [name], MD. Schedule an appointment as soon as possible for a visit in 1 week.   Specialty:  Gastroenterology   Why:  Elevated Liver Enzymes   Contact information:   [address] [place] [address] [phone]       Plan Of Care/Follow-up recommendations:  Activity:  as tolerated  Diet:  Regular Tests:  NA Other:  See below  Is patient on multiple antipsychotic therapies at discharge:  No   Has Patient had three or more failed trials of antipsychotic monotherapy by history:  No  Recommended Plan for Multiple Antipsychotic Therapies: NA   Patient is requesting discharge, and at this time there are no grounds for involuntary commitment. She is leaving unit in good spirits. She states she plans to return home, plans to go to AA daily, and has an established sponsor . Plans to follow up at [place] in [place]. We reviewed lab abnormalities ( elevated AST, ALT, TSH ) and importance of following up for these issues - patient to schedule appts as above.      Jaunice Mirza, [place] 09/16/2014, 11:01 AM

## 2014-09-16 NOTE — Discharge Summary (Signed)
Physician Discharge Summary Note  Patient:  Kaylee Ochoa is an 49 y.o., female MRN:  [id number] DOB:  12/14/1965 Patient phone:  [phone] (home)  Patient address:   [place] [address],  Total Time spent with patient: 30 minutes  Date of Admission:  09/14/2014 Date of Discharge: 09/16/14  Reason for Admission:  Depression, Alcohol abuse   Principal Problem: Alcohol dependence Discharge Diagnoses: Patient Active Problem List   Diagnosis Date Noted  . Alcohol dependence with alcohol-induced mood disorder [F10.24]   . Elevated LFTs [R79.89]   . MDD (major depressive disorder) [F32.2] 09/14/2014  . Bereavement [Z63.4] 05/05/2014  . Alcohol use disorder, severe, dependence [F10.20] 05/04/2014  . 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 Alcohol withdrawal [F10.239] 05/04/2014  . Alcohol dependence [F10.20] 05/27/2012   Musculoskeletal: Strength & Muscle Tone: within normal limits- no tremors, no diaphoresis, not in any acute distress , no psychomotor restlessness  Gait & Station: normal Patient leans: N/A  Psychiatric Specialty Exam: Physical Exam  Psychiatric: She has a normal mood and affect. Her speech is normal and behavior is normal. Judgment and thought content normal. Cognition and memory are normal.    Review of Systems  Constitutional: Negative.   HENT: Negative.   Eyes: Negative.   Respiratory: Negative.   Cardiovascular: Negative.   Gastrointestinal: Negative.   Genitourinary: Negative.   Musculoskeletal: Negative.   Skin: Negative.   Neurological: Negative.   Endo/Heme/Allergies: Negative.   Psychiatric/Behavioral: Positive for depression (Stablized with treatments) and substance abuse (Prior alcohol abuse ). Negative for suicidal ideas, hallucinations and memory loss. The patient is not nervous/anxious and does not have insomnia.     Blood pressure 120/70, pulse 89, temperature 98.6  F (37 C), temperature source Oral, resp. rate 17, height 5' 5.25" (1.657 m), weight 53.071 kg (117 lb), last menstrual period 08/14/2014, SpO2 97 %.Body mass index is 19.33 kg/(m^2).  See Physician SRA     Have you used any form of tobacco in the last 30 days? (Cigarettes, Smokeless Tobacco, Cigars, and/or Pipes): Yes  Has this patient used any form of tobacco in the last 30 days? (Cigarettes, Smokeless Tobacco, Cigars, and/or Pipes) Yes, A prescription for an FDA-approved tobacco cessation medication was offered at discharge and the patient refused  Past Medical History:  Past Medical History  Diagnosis Date  . Hypertension   . Renal disorder   . Depression   . ETOH abuse     Past Surgical History  Procedure Laterality Date  . Breast surgery    . Cyst removal on breast     Family History: History reviewed. No pertinent family history. Social History:  History  Alcohol Use  . Yes    Comment: daily     History  Drug Use No    History   Social History  . Marital Status: Divorced    Spouse Name: N/A  . Number of Children: N/A  . Years of Education: N/A   Social History Main Topics  . Smoking status: Current Every Day Smoker -- 0.25 packs/day for 10 years    Types: Cigarettes  . Smokeless tobacco: Not on file  . Alcohol Use: Yes     Comment: daily  . Drug Use: No  . Sexual Activity: No   Other Topics Concern  . None   Social History Narrative    Past Psychiatric History: Hospitalizations:  Outpatient Care:  Substance Abuse Care:  Self-Mutilation:  Suicidal Attempts:  Violent Behaviors:   Risk to Self: Is patient at risk for suicide?: No What has been your use of drugs/alcohol within the last 12 months?: Patient reports drinking a half gallon liquor every other day Risk to Others:   Prior Inpatient Therapy:   Prior Outpatient Therapy:    Level of Care:  OP  Hospital Course:    ELLORY KHURANA is a 49 y.o. female who voluntarily presents to [facility] for  alcohol detox and SI/Depression. Pt denies HI/AVH. Pt reports the following: pt says she drinks 1/2 gallon of alcohol per daily. Her last drink was 09/13/14, she drank 6-8 "airplane bottles" of liquor. Pt says that when she came to the [facility], she told the medical staff that she didn't feel like living. Pt was tearful during initial interview. Pt has no plan or intent to harm self. She admits one previous SI attempt--she tried to jump off a balcony. Pt says she's been drinking heavily in the last year due to stressors: (1) four family members died within the last year--mother, dad died in a fire, sister and fiance in 06/2014; (2) divorced and has not seen her children; (3) lost job; (4) financial.          BERNIS STECHER was admitted to the adult 400 unit where she was evaluated and her symptoms were identified. Medication management was discussed and implemented. Patient completed the Ativan taper to safely detox her from alcohol.  She was started on Campral 666 mg three times daily for alcohol dependence. She was encouraged to participate in unit programming. Medical problems were identified and treated appropriately. Her liver enzymes were noted to be elevated by Dr. Parke Poisson and also continued to increase. A Hospitalists consult was placed to evaluate the clinical cause for this. The patient will see a GI MD after discharge for further evaluation.  Home medication was restarted as needed.  She was evaluated each day by a clinical provider to ascertain the patient's response to treatment.  Improvement was noted by the patient's report of decreasing symptoms, improved sleep and appetite, affect, medication tolerance, behavior, and participation in unit programming.  The patient was asked each day to complete a self inventory noting mood, mental status, pain, new symptoms, anxiety and concerns.         She responded well to medication and being in a therapeutic and supportive environment. Positive and  appropriate behavior was noted and the patient was motivated for recovery. The patient tolerated the Ativan detox well aside from some minimal distal tremors and facial flushing. Patient reported depressive symptoms were better. Patient was very interested in trying medications that could help her maintain her sobriety such as Campral. She worked closely with the treatment team and case manager to develop a discharge plan with appropriate goals. Coping skills, problem solving as well as relaxation therapies were also part of the unit programming.         By the day of discharge she was in much improved condition than upon admission.  Symptoms were reported as significantly decreased or resolved completely. The patient denied SI/HI and voiced no AVH. She was motivated to continue taking medication with a goal of continued improvement in mental health.  FRANCETTA ILG was discharged home with a plan to follow up as noted below. The patient was provided with sample medications and prescriptions at time of discharge. She left [facility] in stable condition with all belongings returned to her.   Consults:  Internal Medicine for  elevated LFT's.   Significant Diagnostic Studies:  Chemistry profile, Thyroid panel, UDS negative, UA, negative pregnancy test, Alcohol level 353 on admission   Discharge Vitals:   Blood pressure 120/70, pulse 89, temperature 98.6 F (37 C), temperature source Oral, resp. rate 17, height 5' 5.25" (1.657 m), weight 53.071 kg (117 lb), last menstrual period 08/14/2014, SpO2 97 %. Body mass index is 19.33 kg/(m^2). Lab Results:   Results for orders placed or performed during the hospital encounter of 09/14/14 (from the past 72 hour(s))  TSH     Status: Abnormal   Collection Time: 09/14/14  7:36 PM  Result Value Ref Range   TSH 8.533 (H) 0.350 - 4.500 uIU/mL    Comment: Performed at [facility]  T4, free     Status: Abnormal   Collection Time: 09/14/14  7:36 PM  Result  Value Ref Range   Free T4 0.56 (L) 0.61 - 1.12 ng/dL    Comment: Performed at [facility]  Comprehensive metabolic panel     Status: Abnormal   Collection Time: 09/14/14  7:36 PM  Result Value Ref Range   Sodium 137 135 - 145 mmol/L   Potassium 3.3 (L) 3.5 - 5.1 mmol/L   Chloride 95 (L) 101 - 111 mmol/L   CO2 30 22 - 32 mmol/L   Glucose, Bld 131 (H) 65 - 99 mg/dL   BUN 6 6 - 20 mg/dL   Creatinine, Ser 0.50 0.44 - 1.00 mg/dL   Calcium 9.8 8.9 - 10.3 mg/dL   Total Protein 7.4 6.5 - 8.1 g/dL   Albumin 3.9 3.5 - 5.0 g/dL   AST 461 (H) 15 - 41 U/L   ALT 80 (H) 14 - 54 U/L   Alkaline Phosphatase 227 (H) 38 - 126 U/L   Total Bilirubin 0.5 0.3 - 1.2 mg/dL   GFR calc non Af Amer >60 >60 mL/min   GFR calc Af Amer >60 >60 mL/min    Comment: (NOTE) The eGFR has been calculated using the CKD EPI equation. This calculation has not been validated in all clinical situations. eGFR's persistently <60 mL/min signify possible Chronic Kidney Disease.    Anion gap 12 5 - 15    Comment: Performed at [facility]  HIV antibody     Status: None   Collection Time: 09/15/14  7:20 PM  Result Value Ref Range   HIV Screen 4th Generation wRfx Non Reactive Non Reactive    Comment: (NOTE) Performed At: [facility] [place], [place] [id number] Lindon Romp MD [id number] Performed at [facility]   Comprehensive metabolic panel     Status: Abnormal   Collection Time: 09/16/14  6:19 AM  Result Value Ref Range   Sodium 137 135 - 145 mmol/L   Potassium 4.5 3.5 - 5.1 mmol/L    Comment: RESULT REPEATED AND VERIFIED DELTA CHECK NOTED NO VISIBLE HEMOLYSIS    Chloride 98 (L) 101 - 111 mmol/L   CO2 28 22 - 32 mmol/L   Glucose, Bld 104 (H) 65 - 99 mg/dL   BUN 9 6 - 20 mg/dL   Creatinine, Ser 0.45 0.44 - 1.00 mg/dL   Calcium 9.9 8.9 - 10.3 mg/dL   Total Protein 6.8 6.5 - 8.1 g/dL   Albumin 3.6 3.5 - 5.0 g/dL   AST 167 (H) 15 - 41  U/L   ALT 63 (H) 14 - 54 U/L   Alkaline Phosphatase 185 (H) 38 -  126 U/L   Total Bilirubin 0.7 0.3 - 1.2 mg/dL   GFR calc non Af Amer >60 >60 mL/min   GFR calc Af Amer >60 >60 mL/min    Comment: (NOTE) The eGFR has been calculated using the CKD EPI equation. This calculation has not been validated in all clinical situations. eGFR's persistently <60 mL/min signify possible Chronic Kidney Disease.    Anion gap 11 5 - 15    Comment: Performed at [facility]    Physical Findings: AIMS: Facial and Oral Movements Muscles of Facial Expression: None, normal Lips and Perioral Area: None, normal Jaw: None, normal Tongue: None, normal,Extremity Movements Upper (arms, wrists, hands, fingers): None, normal Lower (legs, knees, ankles, toes): None, normal, Trunk Movements Neck, shoulders, hips: None, normal, Overall Severity Severity of abnormal movements (highest score from questions above): None, normal Incapacitation due to abnormal movements: None, normal Patient's awareness of abnormal movements (rate only patient's report): No Awareness, Dental Status Current problems with teeth and/or dentures?: No Does patient usually wear dentures?: No  CIWA:  CIWA-Ar Total: 0 COWS:      See Psychiatric Specialty Exam and Suicide Risk Assessment completed by Attending Physician prior to discharge.  Discharge destination:  Home  Is patient on multiple antipsychotic therapies at discharge:  No   Has Patient had three or more failed trials of antipsychotic monotherapy by history:  No  Recommended Plan for Multiple Antipsychotic Therapies: NA     Medication List    STOP taking these medications        chlordiazePOXIDE 25 MG capsule  Commonly known as:  LIBRIUM     doxepin 10 MG capsule  Commonly known as:  SINEQUAN     hydrOXYzine 25 MG tablet  Commonly known as:  ATARAX/VISTARIL     mirtazapine 30 MG tablet  Commonly known as:  REMERON      TAKE these  medications      Indication   acamprosate 333 MG tablet  Commonly known as:  CAMPRAL  Take 2 tablets (666 mg total) by mouth 3 (three) times daily with meals. For alcohol dependence.   Indication:  Excessive Use of Alcohol     albuterol 108 (90 BASE) MCG/ACT inhaler  Commonly known as:  PROVENTIL HFA;VENTOLIN HFA  Inhale 2 puffs into the lungs every 6 (six) hours as needed for wheezing or shortness of breath.   Indication:  Asthma     multivitamin with minerals Tabs tablet  Take 1 tablet by mouth daily. May purchase over the counter for vitamin supplementation.   Indication:  Vitamin Supplementation     nicotine 21 mg/24hr patch  Commonly known as:  NICODERM CQ - dosed in mg/24 hours  Place 1 patch (21 mg total) onto the skin daily.   Indication:  Nicotine Addiction       Follow-up Information    Follow up with [name], MD. Schedule an appointment as soon as possible for a visit in 1 week.   Specialty:  Endocrinology   Why:  TSH rechecked   Contact information:   [address] [place] [address] [phone]       Follow up with [name], MD. Schedule an appointment as soon as possible for a visit in 1 week.   Specialty:  Gastroenterology   Why:  Elevated Liver Enzymes   Contact information:   [address] [place] [address] [phone]       Follow up with [facility] On  09/20/2014.   Why:  Tuesday, September 20, 2014 at [facility] information:   [address] [place],  [place]  [address]  [phone]      Follow-up recommendations:   Activity: as tolerated  Diet: Regular Tests: NA Other: See below  Comments:   Take all your medications as prescribed by your mental healthcare provider.  Report any adverse effects and or reactions from your medicines to your outpatient provider promptly.  Patient is instructed and cautioned to not engage in alcohol and or illegal drug use while on prescription medicines.  In the event of worsening  symptoms, patient is instructed to call the crisis hotline, 911 and or go to the nearest ED for appropriate evaluation and treatment of symptoms.  Follow-up with your primary care provider for your other medical issues, concerns and or health care needs.   Total Discharge Time: Greater than 30 minutes  Signed: DAVIS, LAURA NP-C 09/16/2014, 4:40 PM   Patient seen, Suicide Assessment Completed.  Disposition Plan Reviewed

## 2014-09-16 NOTE — Progress Notes (Signed)
Pt being d/ced after she eats lunch; will be driving herself home

## 2014-09-16 NOTE — Progress Notes (Signed)
Recreation Therapy Notes  Date: 06.03.16 Time: 9:30am Location: 300 Group Room  Group Topic: Stress Management  Goal Area(s) Addresses:  Patient will verbalize importance of using healthy stress management.  Patient will identify positive emotions associated with healthy stress management.   Intervention: Stress Management  Activity :  Progressive Muscle Relaxation.  LRT introduced patients to stress management technique of progressive muscle relaxation.  A script was used to deliver the technique and patients were asked to follow script read by LRT to engage in practicing the stress management technique.    Education:  Stress Management, Discharge Planning.   Education Outcome: Acknowledges edcuation/In group clarification offered  Clinical Observations/Feedback: Patient did not attend group.    Victorino Sparrow, LRT/CTRS         Ria Comment, Giovoni Bunch A 09/16/2014 1:48 PM

## 2014-09-16 NOTE — Progress Notes (Signed)
Patient Demographics  Kaylee Ochoa, is a 49 y.o. female, DOB - 05/26/1965, TML:[id number]  Admit date - 09/14/2014   Admitting Physician Jenne Campus, MD  Outpatient Primary MD for the patient is Pcp Not In System  LOS - 2   No chief complaint on file.       Subjective:   Kaylee Ochoa today has, No headache, No chest pain, No abdominal pain - No Nausea, No new weakness tingling or numbness, No Cough - SOB.   Assessment & Plan   1. 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 PT discharged by Psych.  2. Mildly Elevated TSH - likely sick euthyroid, follow with PCP in [redacted] weeks along with Endo.  Follow-up Information    Follow up with Kaylee Shin, MD. Schedule an appointment as soon as possible for a visit in 1 week.   Specialty:  Endocrinology   Why:  TSH rechecked   Contact information:   [address] [place] [address] [phone]       Follow up with Kaylee Rusk, MD. Schedule an appointment as soon as possible for a visit in 1 week.   Specialty:  Gastroenterology   Why:  Elevated Liver Enzymes   Contact information:   [address] [place] [address] [phone]       Follow up with [facility] On 09/20/2014.   Why:  Tuesday, September 20, 2014 at [facility] information:   Kaylee Ochoa,  [place]  [id number]  [phone]        Medications  Scheduled Meds: . acamprosate  666 mg Oral TID WC  . feeding supplement (ENSURE ENLIVE)  237 mL Oral TID BM  . LORazepam  1 mg Oral BID   Followed by  . [START ON 09/17/2014] LORazepam  1 mg Oral Daily  . multivitamin with minerals  1  tablet Oral Daily  . nicotine  21 mg Transdermal Daily  . potassium chloride  10 mEq Oral BID  . thiamine  100 mg Oral Daily   Continuous Infusions:  PRN Meds:.albuterol, alum & mag hydroxide-simeth, hydrOXYzine, loperamide, LORazepam, magnesium hydroxide, ondansetron  Antibiotics     Anti-infectives    None        Objective:   Filed Vitals:   09/15/14 1705 09/16/14 0749 09/16/14 0750 09/16/14 1240  BP: 130/80 138/85 127/87 120/70  Pulse: 98 75 100 89  Temp:  98.3 F (36.8 C)  98.6 F (37 C)  TempSrc:  Oral    Resp:  16  17  Height:      Weight:      SpO2:    97%    Wt Readings from Last 3 Encounters:  09/14/14 53.071 kg (117 lb)  09/08/14 52.164 kg (115 lb)  05/04/14 49.896 kg (110 lb)    No intake or output data in the 24 hours ending 09/16/14 1248   Physical Exam  Awake Alert, Oriented X 3, No new F.N deficits, Normal affect [place].AT,PERRAL Supple Neck,No JVD, No cervical lymphadenopathy appriciated.  Symmetrical Chest wall movement,  Good air movement bilaterally, CTAB RRR,No Gallops,Rubs or new Murmurs, No Parasternal Heave +ve B.Sounds, Abd Soft, No tenderness, No organomegaly appriciated, No rebound - guarding or rigidity. No Cyanosis, Clubbing or edema, No new Rash or bruise      Data Review   Micro Results No results found for this or any previous visit (from the past 240 hour(s)).  Radiology Reports US Abdomen Limited Ruq  09/16/2014   CLINICAL DATA:  Elevated liver enzymes.  Ethanol abuse  EXAM: US ABDOMEN LIMITED - RIGHT UPPER QUADRANT  COMPARISON:  None.  FINDINGS: Gallbladder:  The gallbladder appears mildly contracted with a wall borderline thickened. No gallstones are seen. There is no gallbladder wall edema or pericholecystic fluid. No sonographic Murphy sign noted.  Common bile duct:  Diameter: 3 mm. There is no intrahepatic or extrahepatic biliary duct dilatation.  Liver:  No focal lesion identified. Liver echogenicity is somewhat coarse and  increased diffusely.  IMPRESSION: Gallbladder or appears mildly contracted, while patient reports having been NPO for over 10 hours. No gallstones or gallbladder wall edema is seen. The significance of this mild contraction of the gallbladder is uncertain. If there is concern for cystic duct obstruction, nuclear medicine hepatobiliary imaging study could be helpful to further assess.  No demonstrable biliary duct dilatation.  Liver echogenicity is somewhat coarsened increased. These findings raise concern for underlying hepatic steatosis and/or parenchymal liver disease. While no focal liver lesions are identified, it must be cautioned that the sensitivity of ultrasound for focal liver lesions is diminished in this circumstance.   Electronically Signed   By: Lowella Grip III M.D.   On: 09/16/2014 09:03     CBC  Recent Labs Lab 09/13/14 1808  WBC 7.2  HGB 13.1  HCT 38.7  PLT 122*  MCV 101.6*  MCH 34.4*  MCHC 33.9  RDW 15.1  LYMPHSABS 2.5  MONOABS 0.4  EOSABS 0.1  BASOSABS 0.0    Chemistries   Recent Labs Lab 09/13/14 1808 09/14/14 1936 09/16/14 0619  NA 142 137 137  K 2.9* 3.3* 4.5  CL 103 95* 98*  CO2 25 30 28   GLUCOSE 103* 131* 104*  BUN 6 6 9   CREATININE 0.47 0.50 0.45  CALCIUM 8.6* 9.8 9.9  AST 212* 461* 167*  ALT 50 80* 63*  ALKPHOS 198* 227* 185*  BILITOT 0.4 0.5 0.7   ------------------------------------------------------------------------------------------------------------------ estimated creatinine clearance is 73.7 mL/min (by C-G formula based on Cr of 0.45). ------------------------------------------------------------------------------------------------------------------ No results for input(s): HGBA1C in the last 72 hours. ------------------------------------------------------------------------------------------------------------------ No results for input(s): CHOL, HDL, LDLCALC, TRIG, CHOLHDL, LDLDIRECT in the last 72  hours. ------------------------------------------------------------------------------------------------------------------  Recent Labs  09/14/14 1936  TSH 8.533*   ------------------------------------------------------------------------------------------------------------------ No results for input(s): VITAMINB12, FOLATE, FERRITIN, TIBC, IRON, RETICCTPCT in the last 72 hours.  Coagulation profile No results for input(s): INR, PROTIME in the last 168 hours.  No results for input(s): DDIMER in the last 72 hours.  Cardiac Enzymes No results for input(s): CKMB, TROPONINI, MYOGLOBIN in the last 168 hours.  Invalid input(s): CK ------------------------------------------------------------------------------------------------------------------ Invalid input(s): POCBNP   Time Spent in minutes   15   SINGH,PRASHANT K M.D on 09/16/2014 at 12:48 PM  Between 7am to 7pm - Pager - [phone]  After 7pm go to [email or web address] - password [facility]  [facility]   Office  [phone]

## 2014-09-16 NOTE — BHH Group Notes (Signed)
[facility] LCSW Aftercare Discharge Planning Group Note   09/16/2014 10:56 AM    Participation Quality:  Appropraite  Mood/Affect:  Appropriate  Depression Rating:  2  Anxiety Rating:  2  Thoughts of Suicide:  No  Will you contract for safety?   NA  Current AVH:  No  Plan for Discharge/Comments:  Patient attended discharge planning group and actively participated in group. She reports doing well and hopes to discharge soon. Suicide prevention education reviewed and SPE document provided.   Transportation Means: Patient has transportation.   Supports:  Patient has a limited support system.   Kaylee Ochoa, [place] Post

## 2014-09-16 NOTE — Progress Notes (Signed)
  [facility] Adult Case Management Discharge Plan :  Will you be returning to the same living situation after discharge:  Yes,  Patient is returning to her home. At discharge, do you have transportation home?: Yes,  Patient will need assistance with [place]. Do you have the ability to pay for your medications: No.  Patient needs assistance with indigent medications   Release of information consent forms completed and in the chart;  Patient's signature needed at discharge.  Patient to Follow up at: Follow-up Information    Follow up with [name], MD. Schedule an appointment as soon as possible for a visit in 1 week.   Specialty:  Endocrinology   Why:  TSH rechecked   Contact information:   [address] [place] [address] [phone]       Follow up with [name], MD. Schedule an appointment as soon as possible for a visit in 1 week.   Specialty:  Gastroenterology   Why:  Elevated Liver Enzymes   Contact information:   [address] [place] [address] [phone]       Follow up with [facility] On 09/20/2014.   Why:  Tuesday, September 20, 2014 at [facility] information:   [place],    [address]  [phone]      Patient denies SI/HI:  Patient no longer endorsing SI/HI or other thoughts of self harm.  Safety Planning and Suicide Prevention discussed: .Reviewed with all patients during discharge planning group   Have you used any form of tobacco in the last 30 days? (Cigarettes, Smokeless Tobacco, Cigars, and/or Pipes): Yes  Has patient been referred to the Quitline?:  Patient declined referral to Quitline.   Malvin Morrish Hairston 09/16/2014, 11:02 AM

## 2014-09-17 LAB — HEPATITIS PANEL, ACUTE
Hep A IgM: NEGATIVE — AB
Hep B C IgM: NEGATIVE — AB
Hepatitis B Surface Ag: NEGATIVE — AB

## 2014-09-27 ENCOUNTER — Encounter: Payer: Self-pay | Admitting: Urgent Care

## 2014-09-27 ENCOUNTER — Emergency Department
Admission: EM | Admit: 2014-09-27 | Discharge: 2014-09-28 | Disposition: A | Discharge status: Home / Self Care | Payer: Self-pay | Attending: Student | Admitting: Student

## 2014-09-27 DIAGNOSIS — Z720 Tobacco use: Secondary | ICD-10-CM | POA: Insufficient documentation

## 2014-09-27 DIAGNOSIS — F1994 Other psychoactive substance use, unspecified with psychoactive substance-induced mood disorder: Secondary | ICD-10-CM | POA: Diagnosis present

## 2014-09-27 DIAGNOSIS — F19929 Other psychoactive substance use, unspecified with intoxication, unspecified: Secondary | ICD-10-CM

## 2014-09-27 DIAGNOSIS — I10 Essential (primary) hypertension: Secondary | ICD-10-CM | POA: Insufficient documentation

## 2014-09-27 DIAGNOSIS — F3289 Other specified depressive episodes: Secondary | ICD-10-CM

## 2014-09-27 DIAGNOSIS — Z79899 Other long term (current) drug therapy: Secondary | ICD-10-CM | POA: Insufficient documentation

## 2014-09-27 DIAGNOSIS — F10939 Alcohol use, unspecified with withdrawal, unspecified: Secondary | ICD-10-CM | POA: Diagnosis present

## 2014-09-27 DIAGNOSIS — F10239 Alcohol dependence with withdrawal, unspecified: Secondary | ICD-10-CM | POA: Diagnosis present

## 2014-09-27 DIAGNOSIS — F1020 Alcohol dependence, uncomplicated: Secondary | ICD-10-CM | POA: Diagnosis present

## 2014-09-27 DIAGNOSIS — F1010 Alcohol abuse, uncomplicated: Secondary | ICD-10-CM | POA: Insufficient documentation

## 2014-09-27 LAB — COMPREHENSIVE METABOLIC PANEL
ALBUMIN: 4.6 g/dL (ref 3.5–5.0)
ALT: 75 U/L — ABNORMAL HIGH (ref 14–54)
ANION GAP: 10 (ref 5–15)
AST: 196 U/L — ABNORMAL HIGH (ref 15–41)
Alkaline Phosphatase: 218 U/L — ABNORMAL HIGH (ref 38–126)
BILIRUBIN TOTAL: 0.4 mg/dL (ref 0.3–1.2)
BUN: 13 mg/dL (ref 6–20)
CHLORIDE: 106 mmol/L (ref 101–111)
CO2: 27 mmol/L (ref 22–32)
CREATININE: 0.64 mg/dL (ref 0.44–1.00)
Calcium: 9 mg/dL (ref 8.9–10.3)
GFR calc Af Amer: >60 mL/min (ref >60)
GFR calc non Af Amer: >60 mL/min (ref >60)
GLUCOSE: 99 mg/dL (ref 65–99)
POTASSIUM: 3.2 mmol/L — AB (ref 3.5–5.1)
Sodium: 143 mmol/L (ref 135–145)
Total Protein: 8.2 g/dL — ABNORMAL HIGH (ref 6.5–8.1)

## 2014-09-27 LAB — URINALYSIS COMPLETE WITH MICROSCOPIC (ARMC ONLY)
BILIRUBIN URINE: NEGATIVE
GLUCOSE, UA: NEGATIVE mg/dL
HGB URINE DIPSTICK: NEGATIVE
Ketones, ur: NEGATIVE mg/dL
Leukocytes, UA: NEGATIVE
Nitrite: NEGATIVE
Protein, ur: NEGATIVE mg/dL
SPECIFIC GRAVITY, URINE: 1.009 (ref 1.005–1.030)
pH: 6 (ref 5.0–8.0)

## 2014-09-27 LAB — CBC
HCT: 44.8 % (ref 35.0–47.0)
HEMOGLOBIN: 15.6 g/dL (ref 12.0–16.0)
MCH: 36 pg — AB (ref 26.0–34.0)
MCHC: 34.7 g/dL (ref 32.0–36.0)
MCV: 103.6 fL — ABNORMAL HIGH (ref 80.0–100.0)
PLATELETS: 481 10*3/uL — AB (ref 150–440)
RBC: 4.33 MIL/uL (ref 3.80–5.20)
RDW: 17.6 % — ABNORMAL HIGH (ref 11.5–14.5)
WBC: 10.3 10*3/uL (ref 3.6–11.0)

## 2014-09-27 LAB — URINE DRUG SCREEN, QUALITATIVE (ARMC ONLY)
Amphetamines, Ur Screen: NOT DETECTED
BENZODIAZEPINE, UR SCRN: POSITIVE — AB
Barbiturates, Ur Screen: NOT DETECTED
COCAINE METABOLITE, UR ~~LOC~~: NOT DETECTED
Cannabinoid 50 Ng, Ur ~~LOC~~: NOT DETECTED
MDMA (ECSTASY) UR SCREEN: NOT DETECTED
Methadone Scn, Ur: NOT DETECTED
OPIATE, UR SCREEN: NOT DETECTED
Phencyclidine (PCP) Ur S: NOT DETECTED
TRICYCLIC, UR SCREEN: NOT DETECTED

## 2014-09-27 LAB — ACETAMINOPHEN LEVEL: Acetaminophen (Tylenol), Serum: <10 ug/mL — ABNORMAL LOW (ref 10–30)

## 2014-09-27 LAB — POCT PREGNANCY, URINE: Preg Test, Ur: NEGATIVE

## 2014-09-27 LAB — ETHANOL: Alcohol, Ethyl (B): 413 mg/dL (ref <5)

## 2014-09-27 LAB — SALICYLATE LEVEL

## 2014-09-27 MED ORDER — LORAZEPAM 2 MG/ML IJ SOLN: 0.0000 mg | Freq: Two times a day (BID) | INTRAMUSCULAR | Status: DC

## 2014-09-27 MED ORDER — LORAZEPAM 2 MG/ML IJ SOLN: 0.0000 mg | Freq: Four times a day (QID) | INTRAMUSCULAR | Status: DC

## 2014-09-27 MED ORDER — LORAZEPAM 2 MG PO TABS
0.0000 mg | ORAL_TABLET | Freq: Two times a day (BID) | ORAL | Status: DC
  Administered 2014-09-28: 1 mg via ORAL

## 2014-09-27 MED ORDER — VITAMIN B-1 100 MG PO TABS
100.0000 mg | ORAL_TABLET | Freq: Every day | ORAL | Status: DC
  Administered 2014-09-28: 100 mg via ORAL

## 2014-09-27 MED ORDER — LORAZEPAM 2 MG PO TABS
0.0000 mg | ORAL_TABLET | Freq: Four times a day (QID) | ORAL | Status: DC
  Administered 2014-09-28: 1 mg via ORAL
  Administered 2014-09-28: 2 mg via ORAL

## 2014-09-27 MED ORDER — THIAMINE HCL 100 MG/ML IJ SOLN: 100.0000 mg | Freq: Every day | INTRAMUSCULAR | Status: DC

## 2014-09-27 NOTE — ED Notes (Signed)
Upon entering the room, patient observed lying in the floor. Visitor with patient and reports that she did not fall, rather she laid in the floor because it is more comfortable. Patient responsive to verbal stimuli - follows commands, however refuses to get out of the floor. Patient presents tonight for detox from ETOH - last drink was just PTA. Patient has had several failed detox attempts.

## 2014-09-27 NOTE — ED Provider Notes (Signed)
[facility] Emergency Department Provider Note  ____________________________________________  Time seen: Approximately 11:15 PM  I have reviewed the triage vital signs and the nursing notes.   HISTORY  Chief Complaint Alcohol Problem    HPI Kaylee Ochoa is a 49 y.o. female who comes in today for detox from alcohol. The patient reports that she drinks a lot and to the point where she passes out. She reports that her friends feel that she should come to the hospital and get help. The patient called the recovery center and was told to come in and get medically cleared before she could be seen. The patient reports that she drinks a fourth of a gallon of vodka daily and her last drink was approximately 2:58 PM. The patient reports that she tried to detox multiple times in the past with the last time being in May. The patient reports that she tried detox in [place] but they sent her home with Librium for detox and she reports that she is unable to do this by herself. The patient reports that she is sick and cannot care for herself and is also unable to keep her job. The patient reports that she has had significant withdrawal symptoms in DTs in the past.   Past Medical History  Diagnosis Date  . Hypertension   . Renal disorder   . Depression   . ETOH abuse     Patient Active Problem List   Diagnosis Date Noted  . Alcohol dependence with alcohol-induced mood disorder   . Elevated LFTs   . MDD (major depressive disorder) 09/14/2014  . Bereavement 05/05/2014  . Alcohol use disorder, severe, dependence 05/04/2014  . 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 Alcohol withdrawal 05/04/2014  . Alcohol dependence 05/27/2012    Past Surgical History  Procedure Laterality Date  . Breast surgery    . Cyst removal on breast      Current Outpatient Rx  Name  Route  Sig  Dispense  Refill  . acamprosate  (CAMPRAL) 333 MG tablet   Oral   Take 2 tablets (666 mg total) by mouth 3 (three) times daily with meals. For alcohol dependence.   180 tablet   0   . albuterol (PROVENTIL HFA;VENTOLIN HFA) 108 (90 BASE) MCG/ACT inhaler   Inhalation   Inhale 2 puffs into the lungs every 6 (six) hours as needed for wheezing or shortness of breath.   1 Inhaler   0   . Multiple Vitamin (MULTIVITAMIN WITH MINERALS) TABS tablet   Oral   Take 1 tablet by mouth daily. May purchase over the counter for vitamin supplementation.         . nicotine (NICODERM CQ - DOSED IN MG/24 HOURS) 21 mg/24hr patch   Transdermal   Place 1 patch (21 mg total) onto the skin daily.   28 patch   0     Allergies Other and Sulfa antibiotics  No family history on file.  Social History History  Substance Use Topics  . Smoking status: Current Every Day Smoker -- 0.25 packs/day for 10 years    Types: Cigarettes  . Smokeless tobacco: Not on file  . Alcohol Use: Yes     Comment: daily    Review of Systems Constitutional: No fever/chills Eyes: No visual changes. ENT: No sore throat. Cardiovascular: Denies chest pain. Respiratory: shortness of breath. Gastrointestinal: abdominal pain.   nausea,  vomiting.   Genitourinary: dysuria. Musculoskeletal:  back pain.  Skin: Negative for rash. Neurological: Negative for headaches,  10-point ROS otherwise negative.  ____________________________________________   PHYSICAL EXAM:  VITAL SIGNS: ED Triage Vitals  Enc Vitals Group     BP 09/27/14 1957 153/96 mmHg     Pulse Rate 09/27/14 1957 91     Resp 09/27/14 1957 16     Temp 09/27/14 1957 98.6 F (37 C)     Temp Source 09/27/14 1957 Oral     SpO2 09/27/14 1957 93 %     Weight 09/27/14 1957 115 lb (52.164 kg)     Height 09/27/14 1957 5\' 7"  (1.702 m)     Head Cir --      Peak Flow --      Pain Score 09/27/14 1958 0     Pain Loc --      Pain Edu? --      Excl. in [place]? --     Constitutional: Alert and  oriented. Well appearing and in no acute distress. Eyes: Conjunctivae are normal. PERRL. EOMI. Head: Atraumatic. Nose: No congestion/rhinnorhea. Mouth/Throat: Mucous membranes are moist.  Oropharynx non-erythematous. Cardiovascular: Normal rate, regular rhythm. Grossly normal heart sounds.  Good peripheral circulation. Respiratory: Normal respiratory effort.  No retractions. Lungs CTAB. Gastrointestinal: Soft for abdominal tenderness to palpation. No distention. Positive bowel sounds Genitourinary: Deferred Musculoskeletal: No lower extremity tenderness nor edema.   Neurologic:  Normal speech and language. No gross focal neurologic deficits are appreciated.  Skin:  Skin is warm, dry and intact. No rash noted. Psychiatric: Mood and affect are normal.   ____________________________________________   LABS (all labs ordered are listed, but only abnormal results are displayed)  Labs Reviewed  ACETAMINOPHEN LEVEL - Abnormal; Notable for the following:    Acetaminophen (Tylenol), Serum <10 (*)    All other components within normal limits  CBC - Abnormal; Notable for the following:    MCV 103.6 (*)    MCH 36.0 (*)    RDW 17.6 (*)    Platelets 481 (*)    All other components within normal limits  COMPREHENSIVE METABOLIC PANEL - Abnormal; Notable for the following:    Potassium 3.2 (*)    Total Protein 8.2 (*)    AST 196 (*)    ALT 75 (*)    Alkaline Phosphatase 218 (*)    All other components within normal limits  ETHANOL - Abnormal; Notable for the following:    Alcohol, Ethyl (B) 413 (*)    All other components within normal limits  URINE DRUG SCREEN, QUALITATIVE ([facility] ONLY) - Abnormal; Notable for the following:    Benzodiazepine, Ur Scrn POSITIVE (*)    All other components within normal limits  URINALYSIS COMPLETEWITH MICROSCOPIC ([facility] ONLY) - Abnormal; Notable for the following:    Color, Urine YELLOW (*)    APPearance HAZY (*)    Bacteria, UA FEW (*)    Squamous  Epithelial / LPF 6-30 (*)    All other components within normal limits  SALICYLATE LEVEL  POCT PREGNANCY, URINE   ____________________________________________  EKG  None ____________________________________________  RADIOLOGY  None ____________________________________________   PROCEDURES  Procedure(s) performed: None  Critical Care performed: No  ____________________________________________   INITIAL IMPRESSION / ASSESSMENT AND PLAN / ED COURSE  Pertinent labs & imaging results that were available during my care of the patient were reviewed by me and considered in my medical decision making (see chart for details).  This is a 49 year old female with a history of alcoholism who comes in today  requesting detox. I will have the patient seen by the behavioral nurse and determine if she can find placement for her alcohol problems. I will place the patient on [facility]WA protocol as well. ____________________________________________   FINAL CLINICAL IMPRESSION(S) / ED DIAGNOSES  Final diagnoses:  Alcohol abuse      Loney Hering, MD 09/28/14 [phone]

## 2014-09-28 DIAGNOSIS — F10230 Alcohol dependence with withdrawal, uncomplicated: Secondary | ICD-10-CM

## 2014-09-28 MED ORDER — LORAZEPAM 2 MG PO TABS
ORAL_TABLET | ORAL | Status: AC
  Administered 2014-09-28: 2 mg via ORAL
  Filled 2014-09-28: qty 1

## 2014-09-28 MED ORDER — VITAMIN B-1 100 MG PO TABS
ORAL_TABLET | ORAL | Status: AC
  Filled 2014-09-28: qty 1

## 2014-09-28 MED ORDER — LORAZEPAM 1 MG PO TABS
ORAL_TABLET | ORAL | Status: AC
  Filled 2014-09-28: qty 1

## 2014-09-28 MED ORDER — LORAZEPAM 2 MG PO TABS
ORAL_TABLET | ORAL | Status: AC
  Administered 2014-09-28: 2 mg
  Filled 2014-09-28: qty 1

## 2014-09-28 MED ORDER — LORAZEPAM 1 MG PO TABS
ORAL_TABLET | ORAL | Status: AC
  Administered 2014-09-28: 1 mg via ORAL
  Filled 2014-09-28: qty 1

## 2014-09-28 NOTE — Consult Note (Signed)
  Psychiatry: Case discussed with emergency room staff including psychiatric social work. [facility] is requesting more explicit confirmation that the patient is not currently suicidal. At this point I can state that the patient is not suicidal. She is not expressing any suicidal ideas. Has no wish to die. Has no plan or intent to harm herself.

## 2014-09-28 NOTE — ED Notes (Signed)
BEHAVIORAL HEALTH ROUNDING Patient sleeping: No. Patient alert and oriented: yes Behavior appropriate: Yes.  ;  Nutrition and fluids offered: Yes  Toileting and hygiene offered: Yes  Sitter present: no Law enforcement present: Yes   

## 2014-09-28 NOTE — ED Notes (Addendum)
BEHAVIORAL HEALTH ROUNDING  Patient sleeping: Yes.  Patient alert and oriented: Yes Behavior appropriate: Yes. ; If no, describe:  Nutrition and fluids offered: Yes Toileting and hygiene offered: Yes Sitter present: yes  Law enforcement present: Yes

## 2014-09-28 NOTE — ED Notes (Signed)
BEHAVIORAL HEALTH ROUNDING  Patient sleeping: Yes.  Patient alert and oriented: Sleeping  Behavior appropriate: Yes. ; If no, describe:  Nutrition and fluids offered: Sleeping  Toileting and hygiene offered: Sleeping  Sitter present: yes  Law enforcement present: Yes   

## 2014-09-28 NOTE — Progress Notes (Signed)
LCSW faxed over letter to [facility] and confirmed receipt.

## 2014-09-28 NOTE — Progress Notes (Signed)
LCSW received a call from [facility] patient is being transported to [facility] at 7pm, Nurses ands ED staff is notified.  LCSW faxed over letter from Dr Weber Cooks to state patient is not suicidal at this time. Spoke to [employer] at [facility] And patient will be accepted this evening

## 2014-09-28 NOTE — ED Notes (Signed)
BEHAVIORAL HEALTH ROUNDING Patient sleeping: No. Patient alert and oriented: yes Behavior appropriate: Yes.  ; If no, describe:  Nutrition and fluids offered: Yes  Toileting and hygiene offered: Yes  Sitter present: not applicable Law enforcement present: Yes  

## 2014-09-28 NOTE — ED Notes (Signed)
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
## 2014-09-28 NOTE — ED Provider Notes (Signed)
-----------------------------------------   5:16 PM on 09/28/2014 -----------------------------------------  The patient has been accepted by [facility] for detox. We will discharge home once patient ride has arrived.  Harvest Dark, MD 09/28/14 [phone]

## 2014-09-28 NOTE — ED Notes (Signed)
Consult complete D/C to [facility]

## 2014-09-28 NOTE — ED Notes (Signed)
Pt eating breakfast at this time.  

## 2014-09-28 NOTE — ED Notes (Signed)
[facility] to arrive for pt at approximately 1900.

## 2014-09-28 NOTE — Discharge Instructions (Signed)
Alcohol Intoxication Alcohol intoxication occurs when the amount of alcohol that a person has consumed impairs his or her ability to mentally and physically function. Alcohol directly impairs the normal chemical activity of the brain. Drinking large amounts of alcohol can lead to changes in mental function and behavior, and it can cause many physical effects that can be harmful.  Alcohol intoxication can range in severity from mild to very severe. Various factors can affect the level of intoxication that occurs, such as the person's age, gender, weight, frequency of alcohol consumption, and the presence of other medical conditions (such as diabetes, seizures, or heart conditions). Dangerous levels of alcohol intoxication may occur when people drink large amounts of alcohol in a short period (binge drinking). Alcohol can also be especially dangerous when combined with certain prescription medicines or "recreational" drugs. SIGNS AND SYMPTOMS Some common signs and symptoms of mild alcohol intoxication include:  Loss of coordination.  Changes in mood and behavior.  Impaired judgment.  Slurred speech. As alcohol intoxication progresses to more severe levels, other signs and symptoms will appear. These may include:  Vomiting.  Confusion and impaired memory.  Slowed breathing.  Seizures.  Loss of consciousness. DIAGNOSIS  Your health care provider will take a medical history and perform a physical exam. You will be asked about the amount and type of alcohol you have consumed. Blood tests will be done to measure the concentration of alcohol in your blood. In many places, your blood alcohol level must be lower than 80 mg/dL (0.08%) to legally drive. However, many dangerous effects of alcohol can occur at much lower levels.  TREATMENT  People with alcohol intoxication often do not require treatment. Most of the effects of alcohol intoxication are temporary, and they go away as the alcohol naturally  leaves the body. Your health care provider will monitor your condition until you are stable enough to go home. Fluids are sometimes given through an IV access tube to help prevent dehydration.  HOME CARE INSTRUCTIONS  Do not drive after drinking alcohol.  Stay hydrated. Drink enough water and fluids to keep your urine clear or pale yellow. Avoid caffeine.   Only take over-the-counter or prescription medicines as directed by your health care provider.  SEEK MEDICAL CARE IF:   You have persistent vomiting.   You do not feel better after a few days.  You have frequent alcohol intoxication. Your health care provider can help determine if you should see a substance use treatment counselor. SEEK IMMEDIATE MEDICAL CARE IF:   You become shaky or tremble when you try to stop drinking.   You shake uncontrollably (seizure).   You throw up (vomit) blood. This may be bright red or may look like black coffee grounds.   You have blood in your stool. This may be bright red or may appear as a black, tarry, bad smelling stool.   You become lightheaded or faint.  MAKE SURE YOU:   Understand these instructions.  Will watch your condition.  Will get help right away if you are not doing well or get worse. Document Released: 01/09/2005 Document Revised: 12/02/2012 Document Reviewed: 09/04/2012 [facility] Patient Information 2015 [place], [place]. This information is not intended to replace advice given to you by your health care provider. Make sure you discuss any questions you have with your health care provider.  Alcohol Withdrawal Alcohol withdrawal happens when you normally drink alcohol a lot and suddenly stop drinking. Alcohol withdrawal symptoms can be mild to very bad. Mild  withdrawal symptoms can include feeling sick to your stomach (nauseous), headache, or feeling irritable. Bad withdrawal symptoms can include shakiness, being very nervous (anxious), and not thinking clearly.  HOME  CARE  Join an alcohol support group.  Stay away from people or situations that make you want to drink.  Eat a healthy diet. Eat a lot of fresh fruits, vegetables, and lean meats. GET HELP RIGHT AWAY IF:   You become confused. You start to see and hear things that are not really there.  You feel your heart beating very fast.  You throw up (vomit) blood or cannot stop throwing up. This may be bright red or look like black coffee grounds.  You have blood in your poop (stool). This may be bright red, maroon colored, or black and tarry.  You are lightheaded or pass out (faint).  You develop a fever. MAKE SURE YOU:   Understand these instructions.  Will watch your condition.  Will get help right away if you are not doing well or get worse. Document Released: 09/18/2007 Document Revised: 06/24/2011 Document Reviewed: 09/18/2007 [facility] Patient Information 2015 [place], [place]. This information is not intended to replace advice given to you by your health care provider. Make sure you discuss any questions you have with your health care provider.
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
## 2014-09-28 NOTE — [facility] Counselor (Signed)
Information faxed to [facility] ([phone]) and confirmed it was received.  She can be admitted and he will call back with a time for pick up.

## 2014-09-28 NOTE — ED Notes (Signed)
Transfer to [facility]  
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
## 2014-09-28 NOTE — ED Notes (Signed)
BEHAVIORAL HEALTH ROUNDING Patient sleeping: No. Patient alert and oriented: yes Behavior appropriate: Yes.  ; If no, describe:  Nutrition and fluids offered: Yes  Toileting and hygiene offered: Yes  Sitter present: No Law enforcement present: Yes

## 2014-09-28 NOTE — ED Notes (Signed)
Pt dressed and waiting for [facility] representative to transport pt to [facility].

## 2015-06-01 ENCOUNTER — Ambulatory Visit (INDEPENDENT_AMBULATORY_CARE_PROVIDER_SITE_OTHER): Payer: Managed Care, Other (non HMO) | Admitting: Obstetrics and Gynecology

## 2015-06-01 ENCOUNTER — Encounter: Payer: Self-pay | Admitting: Obstetrics and Gynecology

## 2015-06-01 VITALS — BP 100/66 | Ht 67.0 in | Wt 151.0 lb

## 2015-06-01 DIAGNOSIS — N946 Dysmenorrhea, unspecified: Secondary | ICD-10-CM

## 2015-06-01 MED ORDER — TRAMADOL HCL 50 MG PO TABS: 50.0000 mg | ORAL_TABLET | Freq: Four times a day (QID) | ORAL | Status: DC | PRN

## 2015-06-01 NOTE — Progress Notes (Signed)
Patient ID: Kaylee Ochoa, female   DOB: May 30, 1965, 50 y.o.   MRN: [id number]   [facility] Visit  Patient name: Kaylee Ochoa MRN [id number]  Date of birth: 02/12/1966  CC & HPI:  Kaylee Ochoa is a 50 y.o. female presenting today for moderate to severe, progressively worsening,  lower abdominal cramping with periods for 6 months. She states that the pain has caused episodes of emesis and has caused her to miss days of work while on her periods. Pt reports no relief of pain with ibuprofen, tylenol, naproxen. She notes that she [facility] for the same complaints 2 weeks ago, was started on oral contraceptives and scheduled for pelvic and transvaginal US at diagnostic. Pt states that her current period began 3 days ago and has been accompanied by severe cramping. She notes she normally has light bleeding with her periods and this has not changed with worsening of cramping. Pt states her last pap smear was 2 years ago, done at the health department. She denies dyspareunia, menorrhagia. Pt is a current smoker 0.5 ppd, and is actively trying to quit with patches. She reports she has been sober for 8 months and is attending AA. Pt reports she has a prescription for Vicodin for chronic back pain.   ROS:  A complete 10 system review of systems was obtained and all systems are negative except as noted in the HPI and PMH.    Pertinent History Reviewed:   Reviewed: Significant for HTN, alcohol abuse  Medical         Past Medical History  Diagnosis Date  . Hypertension   . Renal disorder   . Depression   . ETOH abuse                               Surgical Hx:    Past Surgical History  Procedure Laterality Date  . Breast surgery    . Cyst removal on breast     Medications: Reviewed & Updated - see associated section                       Current outpatient prescriptions:  .  albuterol (PROVENTIL HFA;VENTOLIN HFA) 108 (90 BASE) MCG/ACT inhaler, Inhale 2 puffs  into the lungs every 6 (six) hours as needed for wheezing or shortness of breath., Disp: 1 Inhaler, Rfl: 0 .  busPIRone (BUSPAR) 10 MG tablet, Take 10 mg by mouth 3 (three) times daily., Disp: , Rfl: 2 .  ERRIN 0.35 MG tablet, , Disp: , Rfl:  .  lisinopril (PRINIVIL,ZESTRIL) 40 MG tablet, Take 40 mg by mouth daily., Disp: , Rfl: 6 .  naproxen (NAPROSYN) 500 MG tablet, TAKE 1 Tablet  BY MOUTH TWICE DAILY, Disp: , Rfl: 3 .  nicotine (NICODERM CQ - DOSED IN MG/24 HOURS) 21 mg/24hr patch, Place 1 patch (21 mg total) onto the skin daily., Disp: 28 patch, Rfl: 0 .  HYDROcodone-acetaminophen (NORCO) 7.5-325 MG tablet, Reported on 06/01/2015, Disp: , Rfl:  .  tiZANidine (ZANAFLEX) 4 MG tablet, Reported on 06/01/2015, Disp: , Rfl:    Social History: Reviewed -  reports that she has been smoking Cigarettes.  She has a 10 pack-year smoking history. She has never used smokeless tobacco.  Objective Findings:  Vitals: Blood pressure 100/66, height 5\' 7"  (1.702 m), weight 151 lb (68.493 kg), last menstrual period 05/29/2015.  Physical  Examination: General appearance - alert, well appearing, and in no distress Mental status - alert, oriented to person, place, and time Abdomen - soft, nontender, nondistended, no masses or organomegaly Pelvic - normal external genitalia, vulva, vagina, cervix, uterus and adnexa.  Vagina- Light menstrual blood. Per vagina, good support.  Uterus- Uterus anterior. Normal sized on internal exam.  Adnexa- nontender, no masses   Discussed with pt risks and benefits of continuing oral contraceptives for 6 weeks followed by reassessment of pain. At end of discussion, pt had opportunity to ask questions and has no further questions at this time. Pt agrees to wait on obtaining US.   Greater than 50% was spent in counseling and coordination of care with the patient. Total time greater than: 25 minutes   Assessment & Plan:   A:  1. Moderate to severe cramping with periods for 6  months-=dysmenorrhea 2. No menorrhagia   P:  1. Continue taking oral contraceptives progesterone only pills due to smoking 2. Will rx Tramadol prn dysmenorrhea      To take with NSAIDS 3. Follow up in 6 weeks for reassessment and consideration of [place]    By signing my name below, I, Hansel Feinstein, attest that this documentation has been prepared under the direction and in the presence of [name], MD. Electronically Signed: Hansel Feinstein, ED Scribe. 06/01/2015. 10:06 AM.  I personally performed the services described in this documentation, which was SCRIBED in my presence. The recorded information has been reviewed and considered accurate. It has been edited as necessary during review. [name], MD

## 2015-06-01 NOTE — Progress Notes (Signed)
Patient ID: Kaylee Ochoa, female   DOB: 1966-03-19, 50 y.o.   MRN: [id number] Pt here today for severe cramping. Pt states she has very light periods but since June the cramps have gotten worse and worse. Pt states that she has missed a few days of work for the past three months.

## 2015-06-08 ENCOUNTER — Ambulatory Visit (HOSPITAL_COMMUNITY): Payer: Managed Care, Other (non HMO) | Attending: Orthopaedic Surgery

## 2015-06-08 ENCOUNTER — Encounter (HOSPITAL_COMMUNITY): Payer: Self-pay

## 2015-06-08 DIAGNOSIS — M545 Low back pain, unspecified: Secondary | ICD-10-CM

## 2015-06-08 DIAGNOSIS — S39012S Strain of muscle, fascia and tendon of lower back, sequela: Secondary | ICD-10-CM | POA: Diagnosis present

## 2015-06-08 DIAGNOSIS — R29898 Other symptoms and signs involving the musculoskeletal system: Secondary | ICD-10-CM | POA: Diagnosis present

## 2015-06-08 DIAGNOSIS — X58XXXS Exposure to other specified factors, sequela: Secondary | ICD-10-CM | POA: Insufficient documentation

## 2015-06-08 DIAGNOSIS — R293 Abnormal posture: Secondary | ICD-10-CM | POA: Diagnosis present

## 2015-06-08 NOTE — Therapy (Addendum)
[facility] [place], [place], [address] Phone: [phone]   Fax:  [phone]  Physical Therapy Evaluation  Patient Details  Name: Kaylee Ochoa MRN: [id number] Date of Birth: Dec 13, 1965 Referring Provider: Dr. Sanjuana Kava  Encounter Date: 06/08/2015      PT End of Session - 06/08/15 1856    Visit Number 1   Number of Visits 11   Date for PT Re-Evaluation 07/07/15   Authorization Type [employer]   Authorization Time Period 06/08/2015 to 07/14/2015   PT Start Time 1735   PT Stop Time 1823   PT Time Calculation (min) 48 min   Activity Tolerance Patient tolerated treatment well   Behavior During Therapy [facility] for tasks assessed/performed      Past Medical History  Diagnosis Date  . Hypertension   . Renal disorder   . Depression   . ETOH abuse     Past Surgical History  Procedure Laterality Date  . Breast surgery    . Cyst removal on breast      There were no vitals filed for this visit.  Visit Diagnosis:  Bilateral low back pain without sciatica - Plan: PT plan of care cert/re-cert  Strain of lumbar paraspinal muscle, sequela - Plan: PT plan of care cert/re-cert  Weakness of both lower extremities - Plan: PT plan of care cert/re-cert  Abnormal posture - Plan: PT plan of care cert/re-cert      Subjective Assessment - 06/08/15 1746    Subjective Kaylee Ochoa is a 50 yo female who currently c/o bilateral LBP that began last year around June 2016 after starting her new job, which requires her to stand for 8 hours. Pt further noted that she fell onto her buttock after slipping on ice while attempting to descend stairs. She recently had x-rays completed to her lumbar spine, which was negative for any acute spinal injuries per pt report. Pt noted that her LBP is only exacerbated at work. She has no issues with lifting, pushing, or pulling activities.    Pertinent History HTN and ETOH abuse    Limitations  Walking;Standing   How long can you sit comfortably? Unlimited   How long can you stand comfortably? 30-60 minutes    How long can you walk comfortably? 30-60 minutes    Patient Stated Goals Pt's goal is to reduce her LBP and be able to work without limitations.    Currently in Pain? Yes   Pain Score 2   LBP ranges between a 0-8/10 on a VAS    Pain Location Back   Pain Orientation Right;Left;Lower   Pain Descriptors / Indicators Burning   Pain Type Chronic pain   Pain Radiating Towards None    Pain Onset More than a month ago   Pain Frequency Intermittent   Aggravating Factors  prolonged standing and walking    Pain Relieving Factors pain meds, mm relaxers, and sitting    Effect of Pain on Daily Activities limits her ability to stand at work    Multiple Pain Sites No            [facility] PT Assessment - 06/08/15 0001    Assessment   Medical Diagnosis LBP   Referring Provider Dr. Sanjuana Kava   Onset Date/Surgical Date 09/14/14   Hand Dominance Right   Next MD Visit 06/13/15   Prior Therapy No   Precautions   Precautions None   Restrictions   Weight Bearing Restrictions No  Balance Screen   Has the patient fallen in the past 6 months Yes   How many times? 1   Has the patient had a decrease in activity level because of a fear of falling?  Yes   Is the patient reluctant to leave their home because of a fear of falling?  No   Observation/Other Assessments   Focus on Therapeutic Outcomes (FOTO)  40% limitation   Sensation   Light Touch Appears Intact   Posture/Postural Control   Postural Limitations Decreased lumbar lordosis   AROM   Overall AROM Comments B hip and knee AROM is WNL    AROM Assessment Site Lumbar   Lumbar Flexion WNL   Lumbar Extension WNL with minor pain noted    Lumbar - Right Side Bend WNL   Lumbar - Left Side Bend WNL   Lumbar - Right Rotation WNL   Lumbar - Left Rotation WNL   Strength   Strength Assessment Site Lumbar   Right/Left Hip  Right;Left   Right Hip Flexion 4+/5   Right Hip Extension 4+/5   Right Hip ABduction 4+/5   Right Hip ADduction 5/5   Left Hip Flexion 4+/5   Left Hip Extension 4+/5   Left Hip ABduction 4+/5   Left Hip ADduction 5/5   Right/Left Knee Right;Left   Right Knee Extension 5/5   Left Knee Flexion 5/5   Right/Left Ankle Right;Left   Right Ankle Dorsiflexion 5/5   Right Ankle Plantar Flexion 5/5   Left Ankle Dorsiflexion 5/5   Left Ankle Plantar Flexion 5/5   Lumbar Flexion 3/5   Lumbar Extension 3+/5   Flexibility   Hamstrings HS 90/90 ( L/R)= -25 deg/-20 deg    Quadriceps WNL    Piriformis 25% limited, bilaterally    Palpation   Spinal mobility WNL   Palpation comment Tender to palpation to lumbar paraspinals, bilaterally   other   Findings Negative   Comments Repeated flexion and extension    Pelvic Dictraction   Findings Negative   Pelvic Compression   Findings Negative   Sacral thrust    Findings Negative   Gaenslen's test   Findings Negative   Sacral Compression   Findings Negative   Apparent   Length pelvic alignment was WNL                            PT Education - 06/08/15 1855    Education provided Yes   Education Details Educated pt on initial HEP, PT eval findings, proper shoe wear,  and pain management strategies including heating pad/ ice pack 15-20 minutes, 3-4x/day, prn for pain   Person(s) Educated Patient   Methods Explanation;Handout;Verbal cues;Demonstration   Comprehension Verbalized understanding;Returned demonstration;Need further instruction          PT Short Term Goals - 06/08/15 1914    PT SHORT TERM GOAL #1   Title Patient will independently verbalize and demo proper completion of her initial HEP to continue with LE strengthening.   Time 2   Period Weeks   Status New   PT SHORT TERM GOAL #2   Title Patient will independently demo improved body mechanics with squatting and lifting in order to reduce strain on the  lumbar spine.    Time 2   Period Weeks   Status New   PT SHORT TERM GOAL #3   Title Patient will be able to stand at work for 3 hours with LBP  rated a 2/10 on a VAS in order to improve tolerance with work related activities.   Time 3   Period Weeks   Status New           PT Long Term Goals - 06/08/15 1917    PT LONG TERM GOAL #1   Title Patient will independently verbalize and demo proper completion of her advanced HEP to continue with LE/core strengthening once DC from PT.   Time 5   Period Weeks   Status New   PT LONG TERM GOAL #2   Title Patient will report decreased LBP to 0-4/10 on a VAS while at work in order to improve tolerance with work related activities.    Time 5   Period Weeks   Status New   PT LONG TERM GOAL #3   Title Patient will improve B hip and core strength by 1 MMT grade in order to improve lumbar stabilization with prolonged standing activities.    Time 5   Period Weeks   Status New   PT LONG TERM GOAL #4   Title Patient will improve FOTO score to < 25% limitation in order to progress towards her PLOF.    Time 5   Period Weeks   Status New               Plan - 06/08/15 1859    Clinical Impression Statement Mrs. Didonato is a 50 yo female who was referred to outpatient PT secondary to chronic LBP without radicular symptoms that began last year (June 2016). 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 The pt currently presents with impairments including bilateral LBP over erector spinae musculature, impaired HS/piriformis flexibility, core/hip weakness, and muscle guarding/spasms of lumbar paraspinal musculature. Lumbar spine and sacral special test were negative for symptom provocation. Pelvic alignment was WNL upon assessment in supine. In addition, no neurologic deficits assessed. The pt would benefit from skilled PT to reduce muscle spasms/guarding, improve  core/hip strength, educate on proper body mechanics/posture, and improve LE flexibility in order to improve her performance and tolerance with work related activities. The pt is in agreement with proposed POC and frequency.     Pt will benefit from skilled therapeutic intervention in order to improve on the following deficits Increased fascial restricitons;Pain;Impaired flexibility;Postural dysfunction;Decreased strength   Rehab Potential Excellent   PT Frequency 2x / week   PT Duration Other (comment)  5 weeks   PT Treatment/Interventions ADLs/Self Care Home Management;Cryotherapy;Electrical Stimulation;Moist Heat;Traction;Therapeutic exercise;Therapeutic activities;Ultrasound;Neuromuscular re-education;Patient/family education;Manual techniques;Taping;Passive range of motion   PT Next Visit Plan Next visit to focus on progressed core stabilization ther ex in quadruped position, squat training, hip strengthening 4-way, and piriformis/HS stretches.    PT Home Exercise Plan HEP initiated this visit with addition of HS stretch, Tr. ab activation, and bridges. Review and progress with added core stabilization and hip strengthening ther ex.   Recommended Other Services None at this time    Consulted and Agree with Plan of Care Patient         Problem List Patient Active Problem List   Diagnosis Date Noted  . Dysmenorrhea 06/01/2015  . Alcohol dependence with alcohol-induced mood disorder ([place])   . Elevated LFTs   . MDD (major depressive disorder) ([place]) 09/14/2014  . Bereavement 05/05/2014  . Alcohol use disorder, severe, dependence ([place]) 05/04/2014  . Substance or medication-induced depressive disorder with onset during intoxication (  [place]) 05/04/2014  . Alcohol withdrawal ([place]) 05/04/2014  . Alcohol dependence ([place]) 05/27/2012    Garen Lah, PT, DPT  06/08/2015, 7:36 PM  [facility] [address] [place], [place], [address] Phone: [phone]    Fax:  [phone]  Name: Kaylee Ochoa MRN: [id number] Date of Birth: 1965/08/22   PHYSICAL THERAPY DISCHARGE SUMMARY  Visits from Start of Care: 1  Current functional level related to goals / functional outcomes: same   Remaining deficits: same   Education / Equipment: HEP  Plan: Patient agrees to discharge.  Patient goals were not met. Patient is being discharged due to financial reasons.  ?????       Rayetta Humphrey, [place] CLT [phone]

## 2015-06-08 NOTE — Patient Instructions (Signed)
   Isometric Transverse abdominal contraction  Lay on your back with your knees bent.    Place your thumbs on your stomach just inside your hip bones to feel the muscle contract.  Activate your abdominals by pulling everything in.  "Try to bring your naval to your spine."  Hold this contraction for as long as possible to improve endurance.    Learn to use this muscle with daily activities such as lifting, bending, rolling.   Hold for 5 seconds and repeat 10-15 reps, 2-3x/day      Bridges  While laying on your back, contract the low abdominals and lift from the hips.  15-20 reps, 2x/day     SEATED HAMSTRING STRETCH  While seated, rest your heel on the floor with your knee straight and gently lean forward until a stretch is felt behind your knee/thigh. Hold 30 seconds and repeat 3x on each side

## 2015-06-13 ENCOUNTER — Ambulatory Visit (INDEPENDENT_AMBULATORY_CARE_PROVIDER_SITE_OTHER): Payer: Managed Care, Other (non HMO) | Admitting: Orthopaedic Surgery

## 2015-06-13 VITALS — BP 114/64 | HR 71 | Temp 98.1°F | Ht 67.0 in | Wt 151.6 lb

## 2015-06-13 DIAGNOSIS — M545 Low back pain, unspecified: Secondary | ICD-10-CM

## 2015-06-13 MED ORDER — HYDROCODONE-ACETAMINOPHEN 7.5-325 MG PO TABS: 1.0000 | ORAL_TABLET | ORAL | Status: DC | PRN

## 2015-06-13 NOTE — Progress Notes (Signed)
Patient RL:[id number] E Lacorte, female DOB:1965/04/21, 50 y.o. [id number]  Chief Complaint  Patient presents with  . Follow-up    back    HPI  Kaylee Ochoa is a 50 y.o. female who has lower back pain that is getting better.  She has been to physical therapy and she is better.  She has less pain now. She has no paresthesias.  She has no bowel or bladder problems.   Back Pain This is a chronic problem. The current episode started more than 1 month ago. The problem has been gradually improving since onset. The pain is present in the lumbar spine. The quality of the pain is described as aching. The pain does not radiate. The pain is at a severity of 2/10. The pain is mild. The symptoms are aggravated by bending and twisting. She has tried home exercises, ice, NSAIDs and walking for the symptoms. The treatment provided moderate relief.    Body mass index is 23.74 kg/(m^2).   Review of Systems  Constitutional:       Patient does not have Diabetes Mellitus. Patient has hypertension. Patient has COPD or shortness of breath. Patient does not have BMI > 35. Patient has current smoking history.  Musculoskeletal: Positive for back pain.    Past Medical History  Diagnosis Date  . Hypertension   . Renal disorder   . Depression   . ETOH abuse     Past Surgical History  Procedure Laterality Date  . Breast surgery    . Cyst removal on breast      Family History  Problem Relation Age of Onset  . Cancer Sister     esophageal    Social History Social History  Substance Use Topics  . Smoking status: Current Every Day Smoker -- 0.50 packs/day for 20 years    Types: Cigarettes  . Smokeless tobacco: Never Used  . Alcohol Use: No     Comment: daily    Allergies  Allergen Reactions  . Other Hives, Itching and Rash    UNKNOWN ANTIBIOTIC: Patient cannot recall name of the medication but it casued a rash all over the body, hives, and itching  . Sulfa Antibiotics Hives    Current  Outpatient Prescriptions  Medication Sig Dispense Refill  . albuterol (PROVENTIL HFA;VENTOLIN HFA) 108 (90 BASE) MCG/ACT inhaler Inhale 2 puffs into the lungs every 6 (six) hours as needed for wheezing or shortness of breath. 1 Inhaler 0  . busPIRone (BUSPAR) 10 MG tablet Take 10 mg by mouth 3 (three) times daily.  2  . ERRIN 0.35 MG tablet     . lisinopril (PRINIVIL,ZESTRIL) 40 MG tablet Take 40 mg by mouth daily.  6  . naproxen (NAPROSYN) 500 MG tablet TAKE 1 Tablet  BY MOUTH TWICE DAILY  3  . nicotine (NICODERM CQ - DOSED IN MG/24 HOURS) 21 mg/24hr patch Place 1 patch (21 mg total) onto the skin daily. 28 patch 0  . tiZANidine (ZANAFLEX) 4 MG tablet Reported on 06/01/2015    . HYDROcodone-acetaminophen (NORCO) 7.5-325 MG tablet Take 1 tablet by mouth every 4 (four) hours as needed for moderate pain (Must last 30 days.  Do not drive or operate machinery while taking this medicine.). 120 tablet 0  . mirtazapine (REMERON) 15 MG tablet Take 15 mg by mouth at bedtime.  2  . traMADol (ULTRAM) 50 MG tablet Take 1 tablet (50 mg total) by mouth every 6 (six) hours as needed for moderate pain or severe pain. (Patient  not taking: Reported on 06/13/2015) 30 tablet 0   No current facility-administered medications for this visit.     Physical Exam  Blood pressure 114/64, pulse 71, temperature 98.1 F (36.7 C), height 5\' 7"  (1.702 m), weight 151 lb 9.6 oz (68.765 kg), last menstrual period 05/29/2015.  Constitutional: overall normal hygiene, normal nutrition, well developed, normal grooming, normal body habitus. Assistive device:none  Musculoskeletal: gait and station Limp none, muscle tone and strength are normal, no tremors or atrophy is present.  .  Neurological: coordination overall normal.  Deep tendon reflex/nerve stretch intact.  Sensation normal.  Cranial nerves II-XII intact.   Skin:   normal overall no scars, lesions, ulcers or rashes. No psoriasis.  Psychiatric: Alert and oriented x 3.   Recent memory intact, remote memory unclear.  Normal mood and affect. Well groomed.  Good eye contact.  Cardiovascular: overall no swelling, no varicosities, no edema bilaterally, normal temperatures of the legs and arms, no clubbing, cyanosis and good capillary refill.  Lymphatic: palpation is normal.  Spine/Pelvis examination:  Inspection:  Overall, sacoiliac joint benign and hips nontender; without crepitus or defects.   Thoracic spine inspection: Alignment normal without kyphosis present   Lumbar spine inspection:  Alignment  with normal lumbar lordosis, without scoliosis apparent.   Thoracic spine palpation:  without tenderness of spinal processes   Lumbar spine palpation: with tenderness of lumbar area; without tightness of lumbar muscles    Range of Motion:   Lumbar flexion, forward flexion is 40  without pain or tenderness    Lumbar extension is normal  without pain or tenderness   Left lateral bend is Normal  without pain or tenderness   Right lateral bend is Normal without pain or tenderness   Straight leg raising is Normal   Strength & tone: Normal   Stability overall normal stability   We reviewed the physical therapy exercises she is doing.  She will try to go to PT once a week or at least every two weeks.  She says she cannot afford the co-pays.  The patient has been educated about the nature of the problem(s) and counseled on treatment options.  The patient appeared to understand what I have discussed and is in agreement with it.  PLAN Call if any problems.  Precautions discussed.  Continue current medications.   Return to clinic one month

## 2015-06-13 NOTE — Patient Instructions (Signed)
Continue exercises

## 2015-06-21 ENCOUNTER — Encounter (HOSPITAL_COMMUNITY): Payer: Self-pay

## 2015-06-28 ENCOUNTER — Ambulatory Visit (HOSPITAL_COMMUNITY): Payer: Managed Care, Other (non HMO)

## 2015-07-12 ENCOUNTER — Ambulatory Visit: Payer: Self-pay | Admitting: Obstetrics and Gynecology

## 2015-07-13 ENCOUNTER — Ambulatory Visit: Payer: Managed Care, Other (non HMO) | Admitting: Obstetrics and Gynecology

## 2015-07-13 ENCOUNTER — Ambulatory Visit (INDEPENDENT_AMBULATORY_CARE_PROVIDER_SITE_OTHER): Payer: Managed Care, Other (non HMO) | Admitting: Orthopaedic Surgery

## 2015-07-13 ENCOUNTER — Encounter: Payer: Self-pay | Admitting: Orthopaedic Surgery

## 2015-07-13 VITALS — BP 117/65 | HR 76 | Temp 97.9°F | Ht 67.0 in | Wt 151.0 lb

## 2015-07-13 DIAGNOSIS — M545 Low back pain, unspecified: Secondary | ICD-10-CM

## 2015-07-13 MED ORDER — HYDROCODONE-ACETAMINOPHEN 7.5-325 MG PO TABS: 1.0000 | ORAL_TABLET | ORAL | Status: DC | PRN

## 2015-07-13 NOTE — Patient Instructions (Signed)
Smoking Cessation, Tips for Success If you are ready to quit smoking, congratulations! You have chosen to help yourself be healthier. Cigarettes bring nicotine, tar, carbon monoxide, and other irritants into your body. Your lungs, heart, and blood vessels will be able to work better without these poisons. There are many different ways to quit smoking. Nicotine gum, nicotine patches, a nicotine inhaler, or nicotine nasal spray can help with physical craving. Hypnosis, support groups, and medicines help break the habit of smoking. WHAT THINGS CAN I DO TO MAKE QUITTING EASIER?  Here are some tips to help you quit for good:  Pick a date when you will quit smoking completely. Tell all of your friends and family about your plan to quit on that date.  Do not try to slowly cut down on the number of cigarettes you are smoking. Pick a quit date and quit smoking completely starting on that day.  Throw away all cigarettes.   Clean and remove all ashtrays from your home, work, and car.  On a card, write down your reasons for quitting. Carry the card with you and read it when you get the urge to smoke.  Cleanse your body of nicotine. Drink enough water and fluids to keep your urine clear or pale yellow. Do this after quitting to flush the nicotine from your body.  Learn to predict your moods. Do not let a bad situation be your excuse to have a cigarette. Some situations in your life might tempt you into wanting a cigarette.  Never have "just one" cigarette. It leads to wanting another and another. Remind yourself of your decision to quit.  Change habits associated with smoking. If you smoked while driving or when feeling stressed, try other activities to replace smoking. Stand up when drinking your coffee. Brush your teeth after eating. Sit in a different chair when you read the paper. Avoid alcohol while trying to quit, and try to drink fewer caffeinated beverages. Alcohol and caffeine may urge you to  smoke.  Avoid foods and drinks that can trigger a desire to smoke, such as sugary or spicy foods and alcohol.  Ask people who smoke not to smoke around you.  Have something planned to do right after eating or having a cup of coffee. For example, plan to take a walk or exercise.  Try a relaxation exercise to calm you down and decrease your stress. Remember, you may be tense and nervous for the first 2 weeks after you quit, but this will pass.  Find new activities to keep your hands busy. Play with a pen, coin, or rubber band. Doodle or draw things on paper.  Brush your teeth right after eating. This will help cut down on the craving for the taste of tobacco after meals. You can also try mouthwash.   Use oral substitutes in place of cigarettes. Try using lemon drops, carrots, cinnamon sticks, or chewing gum. Keep them handy so they are available when you have the urge to smoke.  When you have the urge to smoke, try deep breathing.  Designate your home as a nonsmoking area.  If you are a heavy smoker, ask your health care provider about a prescription for nicotine chewing gum. It can ease your withdrawal from nicotine.  Reward yourself. Set aside the cigarette money you save and buy yourself something nice.  Look for support from others. Join a support group or smoking cessation program. Ask someone at home or at work to help you with your plan   to quit smoking.  Always ask yourself, "Do I need this cigarette or is this just a reflex?" Tell yourself, "Today, I choose not to smoke," or "I do not want to smoke." You are reminding yourself of your decision to quit.  Do not replace cigarette smoking with electronic cigarettes (commonly called e-cigarettes). The safety of e-cigarettes is unknown, and some may contain harmful chemicals.  If you relapse, do not give up! Plan ahead and think about what you will do the next time you get the urge to smoke. HOW WILL I FEEL WHEN I QUIT SMOKING? You  may have symptoms of withdrawal because your body is used to nicotine (the addictive substance in cigarettes). You may crave cigarettes, be irritable, feel very hungry, cough often, get headaches, or have difficulty concentrating. The withdrawal symptoms are only temporary. They are strongest when you first quit but will go away within 10-14 days. When withdrawal symptoms occur, stay in control. Think about your reasons for quitting. Remind yourself that these are signs that your body is healing and getting used to being without cigarettes. Remember that withdrawal symptoms are easier to treat than the major diseases that smoking can cause.  Even after the withdrawal is over, expect periodic urges to smoke. However, these cravings are generally short lived and will go away whether you smoke or not. Do not smoke! WHAT RESOURCES ARE AVAILABLE TO HELP ME QUIT SMOKING? Your health care provider can direct you to community resources or hospitals for support, which may include:  Group support.  Education.  Hypnosis.  Therapy.   This information is not intended to replace advice given to you by your health care provider. Make sure you discuss any questions you have with your health care provider.   Document Released: 12/29/2003 Document Revised: 04/22/2014 Document Reviewed: 09/17/2012 Elsevier Interactive Patient Education 2016 [employer]  

## 2015-07-13 NOTE — Progress Notes (Signed)
[name], female DOB:03-27-1966, 50 y.o. [id number]  Chief Complaint  Patient presents with  . Follow-up    HPI  Kaylee Ochoa is a 50 y.o. female who is having lower back pain chronically.  She has been to PT once since she was last here.  She cannot afford going to PT regularly.  She has been doing her exercises at home.  She has midline lower back pain that is stable with no paresthesias.  She has no new trauma.  She has no bowel or bladder problems.  She has learned and tried new positions in sitting in chairs, etc.    HPI  Body mass index is 23.64 kg/(m^2).   Review of Systems  Constitutional:       Patient does not have Diabetes Mellitus. Patient has hypertension. Patient has COPD or shortness of breath. Patient does not have BMI > 35. Patient has current smoking history.  HENT: Negative for congestion.   Respiratory: Positive for cough and shortness of breath.   Cardiovascular: Negative for chest pain.  Endocrine: Positive for cold intolerance.  Musculoskeletal: Positive for back pain and arthralgias.  Allergic/Immunologic: Positive for environmental allergies.    Past Medical History  Diagnosis Date  . Hypertension   . Renal disorder   . Depression   . ETOH abuse     Past Surgical History  Procedure Laterality Date  . Breast surgery    . Cyst removal on breast      Family History  Problem Relation Age of Onset  . Cancer Sister     esophageal    Social History Social History  Substance Use Topics  . Smoking status: Current Every Day Smoker -- 0.50 packs/day for 20 years    Types: Cigarettes  . Smokeless tobacco: Never Used  . Alcohol Use: No     Comment: daily    Allergies  Allergen Reactions  . Other Hives, Itching and Rash    UNKNOWN ANTIBIOTIC: Patient cannot recall name of the medication but it casued a rash all over the body, hives, and itching  . Sulfa Antibiotics Hives    Current Outpatient Prescriptions   Medication Sig Dispense Refill  . albuterol (PROVENTIL HFA;VENTOLIN HFA) 108 (90 BASE) MCG/ACT inhaler Inhale 2 puffs into the lungs every 6 (six) hours as needed for wheezing or shortness of breath. 1 Inhaler 0  . busPIRone (BUSPAR) 10 MG tablet Take 10 mg by mouth 3 (three) times daily.  2  . ERRIN 0.35 MG tablet     . HYDROcodone-acetaminophen (NORCO) 7.5-325 MG tablet Take 1 tablet by mouth every 4 (four) hours as needed for moderate pain (Must last 30 days.  Do not drive or operate machinery while taking this medicine.). 120 tablet 0  . lisinopril (PRINIVIL,ZESTRIL) 40 MG tablet Take 40 mg by mouth daily.  6  . mirtazapine (REMERON) 15 MG tablet Take 15 mg by mouth at bedtime.  2  . naproxen (NAPROSYN) 500 MG tablet TAKE 1 Tablet  BY MOUTH TWICE DAILY  3  . nicotine (NICODERM CQ - DOSED IN MG/24 HOURS) 21 mg/24hr patch Place 1 patch (21 mg total) onto the skin daily. 28 patch 0  . tiZANidine (ZANAFLEX) 4 MG tablet Reported on 06/01/2015    . traMADol (ULTRAM) 50 MG tablet Take 1 tablet (50 mg total) by mouth every 6 (six) hours as needed for moderate pain or severe pain. 30 tablet 0   No current facility-administered medications for this visit.  Physical Exam  Blood pressure 117/65, pulse 76, temperature 97.9 F (36.6 C), height 5\' 7"  (1.702 m), weight 151 lb (68.493 kg).  Constitutional: overall normal hygiene, normal nutrition, well developed, normal grooming, normal body habitus. Assistive device:none  Musculoskeletal: gait and station Limp none, muscle tone and strength are normal, no tremors or atrophy is present.  .  Neurological: coordination overall normal.  Deep tendon reflex/nerve stretch intact.  Sensation normal.  Cranial nerves II-XII intact.   Skin:   normal overall no scars, lesions, ulcers or rashes. No psoriasis.  Psychiatric: Alert and oriented x 3.  Recent memory intact, remote memory unclear.  Normal mood and affect. Well groomed.  Good eye  contact.  Cardiovascular: overall no swelling, no varicosities, no edema bilaterally, normal temperatures of the legs and arms, no clubbing, cyanosis and good capillary refill.  Lymphatic: palpation is normal.  Spine/Pelvis examination:  Inspection:  Overall, sacoiliac joint benign and hips nontender; without crepitus or defects.   Thoracic spine inspection: Alignment normal without kyphosis present   Lumbar spine inspection:  Alignment  with normal lumbar lordosis, without scoliosis apparent.   Thoracic spine palpation:  without tenderness of spinal processes   Lumbar spine palpation: with tenderness of lumbar area; without tightness of lumbar muscles    Range of Motion:   Lumbar flexion, forward flexion is 35  without pain or tenderness    Lumbar extension is full  without pain or tenderness   Left lateral bend is Normal  without pain or tenderness   Right lateral bend is Normal without pain or tenderness   Straight leg raising is Normal   Strength & tone: Normal   Stability overall normal stability    The patient has been educated about the nature of the problem(s) and counseled on treatment options.  The patient appeared to understand what I have discussed and is in agreement with it.  Encounter Diagnosis  Name Primary?  . Midline low back pain without sciatica Yes    PLAN Call if any problems.  Precautions discussed.  Continue current medications. Continue her exercises at home.  Return to clinic 3 months

## 2015-07-20 ENCOUNTER — Other Ambulatory Visit: Payer: Self-pay | Admitting: Obstetrics and Gynecology

## 2015-07-25 ENCOUNTER — Telehealth: Payer: Self-pay | Admitting: Orthopaedic Surgery

## 2015-07-25 ENCOUNTER — Telehealth: Payer: Self-pay | Admitting: Obstetrics and Gynecology

## 2015-07-25 NOTE — Telephone Encounter (Signed)
Please call pt and offer followup appt. We were to see pt at 6 wk after last visit, and it's been 8 wk.

## 2015-07-25 NOTE — Telephone Encounter (Signed)
Rx done. 

## 2015-07-25 NOTE — Telephone Encounter (Signed)
Has appt 4/24 with Dr Glo Herring, not sure he will refill tramadol when you have RX for norco.

## 2015-07-31 ENCOUNTER — Ambulatory Visit: Payer: Self-pay | Admitting: Obstetrics and Gynecology

## 2015-08-07 ENCOUNTER — Ambulatory Visit: Payer: Self-pay | Admitting: Obstetrics and Gynecology

## 2015-08-10 ENCOUNTER — Telehealth: Payer: Self-pay | Admitting: Orthopaedic Surgery

## 2015-08-10 MED ORDER — HYDROCODONE-ACETAMINOPHEN 7.5-325 MG PO TABS: 1.0000 | ORAL_TABLET | ORAL | Status: DC | PRN

## 2015-08-10 NOTE — Telephone Encounter (Signed)
Rx done. 

## 2015-08-10 NOTE — Telephone Encounter (Signed)
Hydrocodone-Acetaminophen 7.5/325mg Qty 120 Tablets °

## 2015-09-12 ENCOUNTER — Telehealth: Payer: Self-pay | Admitting: Orthopaedic Surgery

## 2015-09-12 MED ORDER — HYDROCODONE-ACETAMINOPHEN 7.5-325 MG PO TABS: 1.0000 | ORAL_TABLET | ORAL | Status: DC | PRN

## 2015-09-12 NOTE — Telephone Encounter (Signed)
Rx done. 

## 2015-09-12 NOTE — Telephone Encounter (Signed)
Patient requests refill on medication: HYDROcodone-acetaminophen (NORCO) 7.5-325 MG tablet [id number] - quantity 120.

## 2015-10-12 ENCOUNTER — Encounter: Payer: Self-pay | Admitting: Orthopaedic Surgery

## 2015-10-12 ENCOUNTER — Ambulatory Visit (INDEPENDENT_AMBULATORY_CARE_PROVIDER_SITE_OTHER): Payer: No Typology Code available for payment source | Admitting: Orthopaedic Surgery

## 2015-10-12 VITALS — BP 119/69 | HR 76 | Temp 97.9°F | Ht 67.0 in | Wt 155.2 lb

## 2015-10-12 DIAGNOSIS — M545 Low back pain, unspecified: Secondary | ICD-10-CM

## 2015-10-12 MED ORDER — HYDROCODONE-ACETAMINOPHEN 7.5-325 MG PO TABS: 1.0000 | ORAL_TABLET | ORAL | Status: DC | PRN

## 2015-10-12 NOTE — Progress Notes (Signed)
[name] E Muench, female DOB:07-20-1965, 50 y.o. [id number]  Chief Complaint  Patient presents with  . Follow-up    BACK PAIN    HPI  Kaylee Ochoa is a 50 y.o. female who has chronic lower back pain.  She has pain as she stands all day at work and not allowed to sit down.  She has no pain when she does not work. She is considering changing jobs.  She has no paresthesias. She has no new trauma or bowel or bladder problems.  She is doing her exercises.  I have encouraged water exercises.  HPI  Body mass index is 24.3 kg/(m^2).  ROS  Review of Systems  Constitutional:       Patient does not have Diabetes Mellitus. Patient has hypertension. Patient has COPD or shortness of breath. Patient does not have BMI > 35. Patient has current smoking history.  HENT: Negative for congestion.   Respiratory: Positive for cough and shortness of breath.   Cardiovascular: Negative for chest pain.  Endocrine: Positive for cold intolerance.  Musculoskeletal: Positive for back pain and arthralgias.  Allergic/Immunologic: Positive for environmental allergies.    Past Medical History  Diagnosis Date  . Hypertension   . Renal disorder   . Depression   . ETOH abuse     Past Surgical History  Procedure Laterality Date  . Breast surgery    . Cyst removal on breast      Family History  Problem Relation Age of Onset  . Cancer Sister     esophageal    Social History Social History  Substance Use Topics  . Smoking status: Current Every Day Smoker -- 0.50 packs/day for 20 years    Types: Cigarettes  . Smokeless tobacco: Never Used  . Alcohol Use: No     Comment: daily    Allergies  Allergen Reactions  . Other Hives, Itching and Rash    UNKNOWN ANTIBIOTIC: Patient cannot recall name of the medication but it casued a rash all over the body, hives, and itching  . Sulfa Antibiotics Hives    Current Outpatient Prescriptions  Medication Sig Dispense Refill  . albuterol  (PROVENTIL HFA;VENTOLIN HFA) 108 (90 BASE) MCG/ACT inhaler Inhale 2 puffs into the lungs every 6 (six) hours as needed for wheezing or shortness of breath. 1 Inhaler 0  . busPIRone (BUSPAR) 10 MG tablet Take 10 mg by mouth 3 (three) times daily.  2  . ERRIN 0.35 MG tablet     . HYDROcodone-acetaminophen (NORCO) 7.5-325 MG tablet Take 1 tablet by mouth every 4 (four) hours as needed for moderate pain (Must last 30 days.  Do not drive or operate machinery while taking this medicine.). 120 tablet 0  . lisinopril (PRINIVIL,ZESTRIL) 40 MG tablet Take 40 mg by mouth daily.  6  . mirtazapine (REMERON) 15 MG tablet Take 15 mg by mouth at bedtime.  2  . naproxen (NAPROSYN) 500 MG tablet TAKE 1 Tablet  BY MOUTH TWICE DAILY  3  . nicotine (NICODERM CQ - DOSED IN MG/24 HOURS) 21 mg/24hr patch Place 1 patch (21 mg total) onto the skin daily. 28 patch 0  . tiZANidine (ZANAFLEX) 4 MG tablet TAKE ONE TABLET BY MOUTH EVERY 8 HOURS AS NEEDED FOR  SPASM 60 tablet 0  . traMADol (ULTRAM) 50 MG tablet Take 1 tablet (50 mg total) by mouth every 6 (six) hours as needed for moderate pain or severe pain. 30 tablet 0   No current facility-administered medications for  this visit.     Physical Exam  Blood pressure 119/69, pulse 76, temperature 97.9 F (36.6 C), height 5\' 7"  (1.702 m), weight 155 lb 3.2 oz (70.398 kg), last menstrual period 09/21/2015.  Constitutional: overall normal hygiene, normal nutrition, well developed, normal grooming, normal body habitus. Assistive device:none  Musculoskeletal: gait and station Limp none, muscle tone and strength are normal, no tremors or atrophy is present.  .  Neurological: coordination overall normal.  Deep tendon reflex/nerve stretch intact.  Sensation normal.  Cranial nerves II-XII intact.   Skin:   normal overall no scars, lesions, ulcers or rashes. No psoriasis.  Psychiatric: Alert and oriented x 3.  Recent memory intact, remote memory unclear.  Normal mood and affect.  Well groomed.  Good eye contact.  Cardiovascular: overall no swelling, no varicosities, no edema bilaterally, normal temperatures of the legs and arms, no clubbing, cyanosis and good capillary refill.  Lymphatic: palpation is normal.  Spine/Pelvis examination:  Inspection:  Overall, sacoiliac joint benign and hips nontender; without crepitus or defects.   Thoracic spine inspection: Alignment normal without kyphosis present   Lumbar spine inspection:  Alignment  with normal lumbar lordosis, without scoliosis apparent.   Thoracic spine palpation:  without tenderness of spinal processes   Lumbar spine palpation: with tenderness of lumbar area; without tightness of lumbar muscles    Range of Motion:   Lumbar flexion, forward flexion is 45  without pain or tenderness    Lumbar extension is 10  without pain or tenderness   Left lateral bend is Normal  without pain or tenderness   Right lateral bend is Normal without pain or tenderness   Straight leg raising is Normal   Strength & tone: Normal   Stability overall normal stability     The patient has been educated about the nature of the problem(s) and counseled on treatment options.  The patient appeared to understand what I have discussed and is in agreement with it.  Encounter Diagnosis  Name Primary?  . Midline low back pain without sciatica Yes    PLAN Call if any problems.  Precautions discussed.  Continue current medications.   Return to clinic 3 months   Electronically Signed Sanjuana Kava, MD 6/29/20173:44 PM

## 2015-11-09 ENCOUNTER — Telehealth: Payer: Self-pay | Admitting: Orthopaedic Surgery

## 2015-11-09 MED ORDER — HYDROCODONE-ACETAMINOPHEN 7.5-325 MG PO TABS: 1.0000 | ORAL_TABLET | ORAL | 0 refills | Status: DC | PRN

## 2015-11-09 NOTE — Telephone Encounter (Signed)
Hydrocodone-Acetaminophen 7.5/325mg Qty 120 Tablets °

## 2015-12-12 ENCOUNTER — Telehealth: Payer: Self-pay | Admitting: Orthopaedic Surgery

## 2015-12-12 MED ORDER — HYDROCODONE-ACETAMINOPHEN 7.5-325 MG PO TABS: 1.0000 | ORAL_TABLET | Freq: Four times a day (QID) | ORAL | 0 refills | Status: DC | PRN

## 2015-12-12 NOTE — Telephone Encounter (Signed)
Patient called for refill:  HYDROcodone-acetaminophen (NORCO) 7.5-325 MG tablet 120 tablet

## 2016-01-11 ENCOUNTER — Ambulatory Visit (INDEPENDENT_AMBULATORY_CARE_PROVIDER_SITE_OTHER): Payer: No Typology Code available for payment source | Admitting: Orthopaedic Surgery

## 2016-01-11 ENCOUNTER — Encounter: Payer: Self-pay | Admitting: Orthopaedic Surgery

## 2016-01-11 VITALS — BP 103/56 | HR 60 | Temp 98.1°F | Ht 66.0 in | Wt 150.0 lb

## 2016-01-11 DIAGNOSIS — F17210 Nicotine dependence, cigarettes, uncomplicated: Secondary | ICD-10-CM

## 2016-01-11 DIAGNOSIS — M545 Low back pain, unspecified: Secondary | ICD-10-CM

## 2016-01-11 MED ORDER — HYDROCODONE-ACETAMINOPHEN 7.5-325 MG PO TABS: 1.0000 | ORAL_TABLET | Freq: Four times a day (QID) | ORAL | 0 refills | Status: DC | PRN

## 2016-01-11 NOTE — Progress Notes (Signed)
[name], female DOB:07/04/1965, 50 y.o. [id number]  Chief Complaint  Patient presents with  . Follow-up    back pain    HPI  Kaylee Ochoa is a 50 y.o. female who has chronic lower back pain.  It is stable.  She is doing her exercises.  She has no paresthesias.  She has no weakness or bowel or bladder problems.  She is cutting back on her smoking and is starting to use patches.  I told her to continue to cut back and stop. HPI  Body mass index is 24.21 kg/m.  ROS  Review of Systems  Constitutional:       Patient does not have Diabetes Mellitus. Patient has hypertension. Patient has COPD or shortness of breath. Patient does not have BMI > 35. Patient has current smoking history.  HENT: Negative for congestion.   Respiratory: Positive for cough and shortness of breath.   Cardiovascular: Negative for chest pain.  Endocrine: Positive for cold intolerance.  Musculoskeletal: Positive for arthralgias and back pain.  Allergic/Immunologic: Positive for environmental allergies.    Past Medical History:  Diagnosis Date  . Depression   . ETOH abuse   . Hypertension   . Renal disorder     Past Surgical History:  Procedure Laterality Date  . BREAST SURGERY    . cyst removal on breast      Family History  Problem Relation Age of Onset  . Cancer Sister     esophageal    Social History Social History  Substance Use Topics  . Smoking status: Current Every Day Smoker    Packs/day: 0.50    Years: 20.00    Types: Cigarettes  . Smokeless tobacco: Never Used  . Alcohol use No     Comment: daily    Allergies  Allergen Reactions  . Other Hives, Itching and Rash    UNKNOWN ANTIBIOTIC: Patient cannot recall name of the medication but it casued a rash all over the body, hives, and itching  . Sulfa Antibiotics Hives    Current Outpatient Prescriptions  Medication Sig Dispense Refill  . albuterol (PROVENTIL HFA;VENTOLIN HFA) 108 (90 BASE) MCG/ACT  inhaler Inhale 2 puffs into the lungs every 6 (six) hours as needed for wheezing or shortness of breath. 1 Inhaler 0  . busPIRone (BUSPAR) 10 MG tablet Take 10 mg by mouth 3 (three) times daily.  2  . ERRIN 0.35 MG tablet     . HYDROcodone-acetaminophen (NORCO) 7.5-325 MG tablet Take 1 tablet by mouth every 6 (six) hours as needed for moderate pain (Must last 30 days.Do not drive or operate machinery while taking this medicine.). 100 tablet 0  . lisinopril (PRINIVIL,ZESTRIL) 40 MG tablet Take 40 mg by mouth daily.  6  . mirtazapine (REMERON) 15 MG tablet Take 15 mg by mouth at bedtime.  2  . naproxen (NAPROSYN) 500 MG tablet TAKE 1 Tablet  BY MOUTH TWICE DAILY  3  . nicotine (NICODERM CQ - DOSED IN MG/24 HOURS) 21 mg/24hr patch Place 1 patch (21 mg total) onto the skin daily. 28 patch 0  . tiZANidine (ZANAFLEX) 4 MG tablet TAKE ONE TABLET BY MOUTH EVERY 8 HOURS AS NEEDED FOR  SPASM 60 tablet 0  . traMADol (ULTRAM) 50 MG tablet Take 1 tablet (50 mg total) by mouth every 6 (six) hours as needed for moderate pain or severe pain. 30 tablet 0   No current facility-administered medications for this visit.      Physical  Exam  Blood pressure (!) 103/56, pulse 60, temperature 98.1 F (36.7 C), height 5\' 6"  (1.676 m), weight 150 lb (68 kg).  Constitutional: overall normal hygiene, normal nutrition, well developed, normal grooming, normal body habitus. Assistive device:none  Musculoskeletal: gait and station Limp none, muscle tone and strength are normal, no tremors or atrophy is present.  .  Neurological: coordination overall normal.  Deep tendon reflex/nerve stretch intact.  Sensation normal.  Cranial nerves II-XII intact.   Skin:   Normal overall no scars, lesions, ulcers or rashes. No psoriasis.  Psychiatric: Alert and oriented x 3.  Recent memory intact, remote memory unclear.  Normal mood and affect. Well groomed.  Good eye contact.  Cardiovascular: overall no swelling, no varicosities,  no edema bilaterally, normal temperatures of the legs and arms, no clubbing, cyanosis and good capillary refill.  Lymphatic: palpation is normal.  Spine/Pelvis examination:  Inspection:  Overall, sacoiliac joint benign and hips nontender; without crepitus or defects.   Thoracic spine inspection: Alignment normal without kyphosis present   Lumbar spine inspection:  Alignment  with normal lumbar lordosis, without scoliosis apparent.   Thoracic spine palpation:  without tenderness of spinal processes   Lumbar spine palpation: with tenderness of lumbar area; without tightness of lumbar muscles    Range of Motion:   Lumbar flexion, forward flexion is 45 without pain or tenderness    Lumbar extension is 10 without pain or tenderness   Left lateral bend is Normal  without pain or tenderness   Right lateral bend is Normal without pain or tenderness   Straight leg raising is Normal   Strength & tone: Normal   Stability overall normal stability     The patient has been educated about the nature of the problem(s) and counseled on treatment options.  The patient appeared to understand what I have discussed and is in agreement with it.  Encounter Diagnoses  Name Primary?  . Midline low back pain without sciatica Yes  . Cigarette nicotine dependence without complication     PLAN Call if any problems.  Precautions discussed.  Continue current medications.   Return to clinic 3 months   Electronically Signed Sanjuana Kava, MD 9/28/20178:32 AM

## 2016-01-11 NOTE — Patient Instructions (Signed)
Smoking Cessation, Tips for Success If you are ready to quit smoking, congratulations! You have chosen to help yourself be healthier. Cigarettes bring nicotine, tar, carbon monoxide, and other irritants into your body. Your lungs, heart, and blood vessels will be able to work better without these poisons. There are many different ways to quit smoking. Nicotine gum, nicotine patches, a nicotine inhaler, or nicotine nasal spray can help with physical craving. Hypnosis, support groups, and medicines help break the habit of smoking. WHAT THINGS CAN I DO TO MAKE QUITTING EASIER?  Here are some tips to help you quit for good:  Pick a date when you will quit smoking completely. Tell all of your friends and family about your plan to quit on that date.  Do not try to slowly cut down on the number of cigarettes you are smoking. Pick a quit date and quit smoking completely starting on that day.  Throw away all cigarettes.   Clean and remove all ashtrays from your home, work, and car.  On a card, write down your reasons for quitting. Carry the card with you and read it when you get the urge to smoke.  Cleanse your body of nicotine. Drink enough water and fluids to keep your urine clear or pale yellow. Do this after quitting to flush the nicotine from your body.  Learn to predict your moods. Do not let a bad situation be your excuse to have a cigarette. Some situations in your life might tempt you into wanting a cigarette.  Never have "just one" cigarette. It leads to wanting another and another. Remind yourself of your decision to quit.  Change habits associated with smoking. If you smoked while driving or when feeling stressed, try other activities to replace smoking. Stand up when drinking your coffee. Brush your teeth after eating. Sit in a different chair when you read the paper. Avoid alcohol while trying to quit, and try to drink fewer caffeinated beverages. Alcohol and caffeine may urge you to  smoke.  Avoid foods and drinks that can trigger a desire to smoke, such as sugary or spicy foods and alcohol.  Ask people who smoke not to smoke around you.  Have something planned to do right after eating or having a cup of coffee. For example, plan to take a walk or exercise.  Try a relaxation exercise to calm you down and decrease your stress. Remember, you may be tense and nervous for the first 2 weeks after you quit, but this will pass.  Find new activities to keep your hands busy. Play with a pen, coin, or rubber band. Doodle or draw things on paper.  Brush your teeth right after eating. This will help cut down on the craving for the taste of tobacco after meals. You can also try mouthwash.   Use oral substitutes in place of cigarettes. Try using lemon drops, carrots, cinnamon sticks, or chewing gum. Keep them handy so they are available when you have the urge to smoke.  When you have the urge to smoke, try deep breathing.  Designate your home as a nonsmoking area.  If you are a heavy smoker, ask your health care provider about a prescription for nicotine chewing gum. It can ease your withdrawal from nicotine.  Reward yourself. Set aside the cigarette money you save and buy yourself something nice.  Look for support from others. Join a support group or smoking cessation program. Ask someone at home or at work to help you with your plan   to quit smoking.  Always ask yourself, "Do I need this cigarette or is this just a reflex?" Tell yourself, "Today, I choose not to smoke," or "I do not want to smoke." You are reminding yourself of your decision to quit.  Do not replace cigarette smoking with electronic cigarettes (commonly called e-cigarettes). The safety of e-cigarettes is unknown, and some may contain harmful chemicals.  If you relapse, do not give up! Plan ahead and think about what you will do the next time you get the urge to smoke. HOW WILL I FEEL WHEN I QUIT SMOKING? You  may have symptoms of withdrawal because your body is used to nicotine (the addictive substance in cigarettes). You may crave cigarettes, be irritable, feel very hungry, cough often, get headaches, or have difficulty concentrating. The withdrawal symptoms are only temporary. They are strongest when you first quit but will go away within 10-14 days. When withdrawal symptoms occur, stay in control. Think about your reasons for quitting. Remind yourself that these are signs that your body is healing and getting used to being without cigarettes. Remember that withdrawal symptoms are easier to treat than the major diseases that smoking can cause.  Even after the withdrawal is over, expect periodic urges to smoke. However, these cravings are generally short lived and will go away whether you smoke or not. Do not smoke! WHAT RESOURCES ARE AVAILABLE TO HELP ME QUIT SMOKING? Your health care provider can direct you to community resources or hospitals for support, which may include:  Group support.  Education.  Hypnosis.  Therapy.   This information is not intended to replace advice given to you by your health care provider. Make sure you discuss any questions you have with your health care provider.   Document Released: 12/29/2003 Document Revised: 04/22/2014 Document Reviewed: 09/17/2012 Elsevier Interactive Patient Education 2016 [employer]  

## 2016-01-16 ENCOUNTER — Encounter: Payer: Self-pay | Admitting: Adult Health

## 2016-01-16 ENCOUNTER — Ambulatory Visit (INDEPENDENT_AMBULATORY_CARE_PROVIDER_SITE_OTHER): Payer: PRIVATE HEALTH INSURANCE | Admitting: Adult Health

## 2016-01-16 VITALS — BP 138/88 | HR 88 | Ht 67.0 in | Wt 145.0 lb

## 2016-01-16 DIAGNOSIS — R110 Nausea: Secondary | ICD-10-CM | POA: Diagnosis not present

## 2016-01-16 DIAGNOSIS — N946 Dysmenorrhea, unspecified: Secondary | ICD-10-CM | POA: Diagnosis not present

## 2016-01-16 MED ORDER — PROMETHAZINE HCL 25 MG PO TABS: 25.0000 mg | ORAL_TABLET | Freq: Four times a day (QID) | ORAL | 1 refills | Status: DC | PRN

## 2016-01-16 MED ORDER — KETOROLAC TROMETHAMINE 10 MG PO TABS: 10.0000 mg | ORAL_TABLET | Freq: Four times a day (QID) | ORAL | 0 refills | Status: DC | PRN

## 2016-01-16 NOTE — Patient Instructions (Signed)

## 2016-01-16 NOTE — Progress Notes (Signed)
Subjective:     Patient ID: Kaylee Ochoa, female   DOB: [birth year], 50 y.o.   MRN: [id number]  HPI Kaylee Ochoa is a 50 year old white female,divorced, in complaining of bad period cramps yesterday with nausea, had to leave work.She was placed on POP in [place] saw Dr Glo Herring and cramps had been better,and bleeding is light.She has always had cramps but worse in last year.Had sweating too from the pain.No pain with sex.  Review of Systems +bad period cramps +nausea +Sweating when in pain No pain with sex  Reviewed past medical,surgical, social and family history. Reviewed medications and allergies.     Objective:   Physical Exam BP 138/88 (BP Location: Left Arm, Patient Position: Sitting, Cuff Size: Normal)   Pulse 88   Ht 5\' 7"  (1.702 m)   Wt 145 lb (65.8 kg)   LMP 01/15/2016 (Exact Date)   BMI 22.71 kg/m  Skin warm and dry.Pelvic: external genitalia is normal in appearance no lesions, vagina: dark period like blood,urethra has no lesions or masses noted, cervix:smooth, no CMT, uterus: normal size, shape and contour, mildly tender, no masses felt, adnexa: no masses or tenderness noted. Bladder is non tender and no masses felt.Will get [place] to assess uterus. GC/CHL on urine.   PHQ 2 score 0. Face time 15 minutes.   Assessment:     1. Dysmenorrhea       Plan:    GC/CHL sent on urine  Rx toradol 10 mg take 1 every 6 hours prn pain #20 no refills Rx phenergan 25 mg #30 take 1 every 6 hours prn with 1 refill  Return in am for GYN [place] Review handout on dysmenorrhea Note given to return to work tomorrow

## 2016-01-17 ENCOUNTER — Other Ambulatory Visit: Payer: PRIVATE HEALTH INSURANCE

## 2016-01-19 ENCOUNTER — Ambulatory Visit (INDEPENDENT_AMBULATORY_CARE_PROVIDER_SITE_OTHER): Payer: PRIVATE HEALTH INSURANCE

## 2016-01-19 DIAGNOSIS — N800 Endometriosis of uterus: Secondary | ICD-10-CM | POA: Diagnosis not present

## 2016-01-19 DIAGNOSIS — N946 Dysmenorrhea, unspecified: Secondary | ICD-10-CM

## 2016-01-19 NOTE — Progress Notes (Signed)
PELVIC US TA/TV: heterogeneous anteverted uterus w/linear striations in the myometrium (? Adenomyosis),limited view of endometrial/myometrial boarders,EEC 3.54mm,normal ov's bilat, no free fluid,pelvic pain during ultrasound.

## 2016-01-20 LAB — GC/CHLAMYDIA PROBE AMP
Chlamydia trachomatis, NAA: NEGATIVE
NEISSERIA GONORRHOEAE BY PCR: NEGATIVE

## 2016-01-22 ENCOUNTER — Other Ambulatory Visit: Payer: Self-pay | Admitting: Adult Health

## 2016-01-23 ENCOUNTER — Telehealth: Payer: Self-pay | Admitting: Adult Health

## 2016-01-23 NOTE — Telephone Encounter (Signed)
Informed pt on neg GC/CHL, she has questions about the results of her U/S and wants to discuss with Anderson Malta.

## 2016-01-23 NOTE — Telephone Encounter (Signed)
Pt aware of US,make appt with Dr Glo Herring to discuss options, IUD,ablation or hysterectomy for severe dysmenorrhea

## 2016-01-31 ENCOUNTER — Encounter: Payer: Self-pay | Admitting: Obstetrics and Gynecology

## 2016-01-31 ENCOUNTER — Ambulatory Visit (INDEPENDENT_AMBULATORY_CARE_PROVIDER_SITE_OTHER): Payer: PRIVATE HEALTH INSURANCE | Admitting: Obstetrics and Gynecology

## 2016-01-31 VITALS — BP 116/72 | HR 80 | Ht 67.0 in | Wt 146.8 lb

## 2016-01-31 DIAGNOSIS — N946 Dysmenorrhea, unspecified: Secondary | ICD-10-CM

## 2016-01-31 NOTE — Progress Notes (Signed)
Patient ID: BRITTANY WILLCUT, female   DOB: [birth year], 50 y.o.   MRN: [id number]   [facility] Visit  @DATE @            Patient name: Kaylee Ochoa MRN [id number]  Date of birth: 04/17/1965  CC & HPI:   Chief Complaint  Patient presents with  . discuss surgery     Kaylee Ochoa is a 50 y.o. female presenting today for discussion of surgery for management of dysmenorrhea. Pt states she takes the mini pill continuously and her pain is still very intense despite having a very light flow. She states her most recent period caused her so much pain that she was lying on the floor and vomiting.   Transvaginal US on 01/19/16 showed linear striations in the myometrium (? Adenomyosis), 3.7 mm endometrium, ovaries wnl  ROS:  Review of Systems  Genitourinary:       +dysmenorrhea     Pertinent History Reviewed:   Reviewed  Medical         Past Medical History:  Diagnosis Date  . Depression   . ETOH abuse   . Hypertension   . Renal disorder                               Surgical Hx:    Past Surgical History:  Procedure Laterality Date  . BREAST SURGERY    . cyst removal on breast     Medications: Reviewed & Updated - see associated section                       Current Outpatient Prescriptions:  .  albuterol (PROVENTIL HFA;VENTOLIN HFA) 108 (90 BASE) MCG/ACT inhaler, Inhale 2 puffs into the lungs every 6 (six) hours as needed for wheezing or shortness of breath., Disp: 1 Inhaler, Rfl: 0 .  busPIRone (BUSPAR) 10 MG tablet, Take 10 mg by mouth 3 (three) times daily., Disp: , Rfl: 2 .  lisinopril (PRINIVIL,ZESTRIL) 40 MG tablet, Take 40 mg by mouth daily., Disp: , Rfl: 6 .  mirtazapine (REMERON) 15 MG tablet, Take 15 mg by mouth at bedtime., Disp: , Rfl: 2 .  naproxen (NAPROSYN) 500 MG tablet, TAKE 1 Tablet  BY MOUTH TWICE DAILY, Disp: , Rfl: 3 .  nicotine (NICODERM CQ - DOSED IN MG/24 HOURS) 21 mg/24hr patch, Place 1 patch (21 mg total) onto the skin daily., Disp: 28 patch,  Rfl: 0 .  norethindrone (MICRONOR,CAMILA,ERRIN) 0.35 MG tablet, Take 1 tablet by mouth daily., Disp: , Rfl:  .  HYDROcodone-acetaminophen (NORCO) 7.5-325 MG tablet, Take 1 tablet by mouth every 6 (six) hours as needed for moderate pain (Must last 30 days.Do not drive or operate machinery while taking this medicine.). (Patient not taking: Reported on 01/31/2016), Disp: 100 tablet, Rfl: 0 .  ketorolac (TORADOL) 10 MG tablet, Take 1 tablet (10 mg total) by mouth every 6 (six) hours as needed. (Patient not taking: Reported on 01/31/2016), Disp: 20 tablet, Rfl: 0 .  promethazine (PHENERGAN) 25 MG tablet, Take 1 tablet (25 mg total) by mouth every 6 (six) hours as needed for nausea or vomiting. (Patient not taking: Reported on 01/31/2016), Disp: 30 tablet, Rfl: 1   Social History: Reviewed -  reports that she has been smoking Cigarettes.  She has a 5.00 pack-year smoking history. She has never used smokeless tobacco.  Objective Findings:  Vitals: Blood pressure 116/72, pulse  80, height 5\' 7"  (1.702 m), weight 146 lb 12.8 oz (66.6 kg), last menstrual period 01/15/2016.  Physical Examination: Discussion   Discussed with pt risks and benefits of endometrial ablation vs supracervical hysterectomy. Advised pt that ablation would have ~80% success rate in her case. At end of discussion, pt had opportunity to ask questions and has no further questions at this time.   Greater than 50% was spent in counseling and coordination of care with the patient. Total time greater than: 25 minutes    Assessment & Plan:   A:  1. Dysmenorrhea  2. Adenomyosis vs cervical stenosis   P:  1. F/u for endometrial biopsy and pre-op visit w/in 5 days  2. Schedule endometrial ablation (pt is requesting procedure prior to next period in ~3 weeks)  3. Pt provided with [employer] endometrial ablation information packet.     By signing my name below, I, Kaylee Ochoa, attest that this documentation has been prepared under the  direction and in the presence of [name], MD. Electronically Signed: Hansel Ochoa, ED Scribe. 01/31/16. 3:17 PM.  I personally performed the services described in this documentation, which was SCRIBED in my presence. The recorded information has been reviewed and considered accurate. It has been edited as necessary during review. [name], MD

## 2016-02-07 ENCOUNTER — Telehealth: Payer: Self-pay | Admitting: Orthopaedic Surgery

## 2016-02-08 MED ORDER — HYDROCODONE-ACETAMINOPHEN 7.5-325 MG PO TABS: 1.0000 | ORAL_TABLET | Freq: Four times a day (QID) | ORAL | 0 refills | Status: DC | PRN

## 2016-02-09 ENCOUNTER — Other Ambulatory Visit: Payer: PRIVATE HEALTH INSURANCE | Admitting: Obstetrics and Gynecology

## 2016-02-12 ENCOUNTER — Ambulatory Visit (INDEPENDENT_AMBULATORY_CARE_PROVIDER_SITE_OTHER): Payer: PRIVATE HEALTH INSURANCE | Admitting: Obstetrics and Gynecology

## 2016-02-12 ENCOUNTER — Encounter: Payer: Self-pay | Admitting: Obstetrics and Gynecology

## 2016-02-12 VITALS — BP 102/68 | HR 70 | Wt 154.2 lb

## 2016-02-12 DIAGNOSIS — N946 Dysmenorrhea, unspecified: Secondary | ICD-10-CM | POA: Diagnosis not present

## 2016-02-12 DIAGNOSIS — Z3202 Encounter for pregnancy test, result negative: Secondary | ICD-10-CM

## 2016-02-12 LAB — POCT URINE PREGNANCY: Preg Test, Ur: NEGATIVE

## 2016-02-12 NOTE — Progress Notes (Signed)
Patient ID: Kaylee Ochoa, female   DOB: 1966-02-21, 50 y.o.   MRN: [id number]  Endometrial Biopsy: Patient given informed consent, signed copy in the chart, time out was performed. Time out taken. The patient was placed in the lithotomy position and the cervix brought into view with sterile speculum.  Portio of cervix cleansed x 2 with betadine swabs.  A tenaculum was placed in the anterior lip of the cervix. Unable to sound the uterus d/t excessive discomfort.   Pt was unable to tolerate procedure d/t pain.   Discussion: 1. Discussed with pt risks of completing endometrial biopsy at the time of endometrial ablation. Pt informed that cervical block could be performed in office to complete biopsy prior to ablation; however, this would not be recommended as she would likely not tolerate the numbing.   2. Discussed the risk that endometrial biopsy pathology during the time of surgery could come back abnormal, meaning that pt may be a candidate for hysterectomy.   At end of discussion, pt had opportunity to ask questions and has no further questions at this time.   Specific discussion of risks of completing endometrial biopsy during endometrial ablation as noted above. Greater than 50% was spent in counseling and coordination of care with the patient.   Total time greater than: 15 minutes.   Assessment : Dysmenorrhea Nulligravida Schedule endometrial ablation on a Tuesday in 2-3 weeks. (after pt finishes class)   Plan to complete biopsy at time of endometrial ablation. THe patient is aware that this leaves open the possibility that additional procedures could be required if the post=ablation results were abnormal, a low likelihood possibilitiy that has not been able to be ruled out completely due to pt intolerance of endometrial bx effort.    By signing my name below, I, Hansel Feinstein, attest that this documentation has been prepared under the direction and in the presence of [name],  MD. Electronically Signed: Hansel Feinstein, ED Scribe. 02/12/16. 1:55 PM.  I personally performed the services described in this documentation, which was SCRIBED in my presence. The recorded information has been reviewed and considered accurate. It has been edited as necessary during review. [name], MD

## 2016-02-12 NOTE — Patient Instructions (Signed)
.  jfend

## 2016-02-20 NOTE — Patient Instructions (Signed)
Kaylee Ochoa  02/20/2016     @PREFPERIOPPHARMACY @   Your procedure is scheduled on  02/27/2016   Report to [facility] at  730   A.M.  Call this number if you have problems the morning of surgery:  [phone]   Remember:  Do not eat food or drink liquids after midnight.  Take these medicines the morning of surgery with A SIP OF WATER  Buspar, hydrocodone, toradol, lisinopril, claritin.   Do not wear jewelry, make-up or nail polish.  Do not wear lotions, powders, or perfumes, or deoderant.  Do not shave 48 hours prior to surgery.  Men may shave face and neck.  Do not bring valuables to the hospital.  [facility] is not responsible for any belongings or valuables.  Contacts, dentures or bridgework may not be worn into surgery.  Leave your suitcase in the car.  After surgery it may be brought to your room.  For patients admitted to the hospital, discharge time will be determined by your treatment team.  Patients discharged the day of surgery will not be allowed to drive home.   Name and phone number of your driver:   family Special instructions:  none  Please read over the following fact sheets that you were given. Anesthesia Post-op Instructions and Care and Recovery After Surgery      Hysteroscopy Hysteroscopy is a procedure used for looking inside the womb (uterus). It may be done for various reasons, including:  To evaluate abnormal bleeding, fibroid (benign, noncancerous) tumors, polyps, scar tissue (adhesions), and possibly cancer of the uterus.  To look for lumps (tumors) and other uterine growths.  To look for causes of why a woman cannot get pregnant (infertility), causes of recurrent loss of pregnancy (miscarriages), or a lost intrauterine device (IUD).  To perform a sterilization by blocking the fallopian tubes from inside the uterus. In this procedure, a thin, flexible tube with a tiny light and camera on the end of it  (hysteroscope) is used to look inside the uterus. A hysteroscopy should be done right after a menstrual period to be sure you are not pregnant. LET [facility] CARE PROVIDER KNOW ABOUT:   Any allergies you have.  All medicines you are taking, including vitamins, herbs, eye drops, creams, and over-the-counter medicines.  Previous problems you or members of your family have had with the use of anesthetics.  Any blood disorders you have.  Previous surgeries you have had.  Medical conditions you have. RISKS AND COMPLICATIONS  Generally, this is a safe procedure. However, as with any procedure, complications can occur. Possible complications include:  Putting a hole in the uterus.  Excessive bleeding.  Infection.  Damage to the cervix.  Injury to other organs.  Allergic reaction to medicines.  Too much fluid used in the uterus for the procedure. BEFORE THE PROCEDURE   Ask your health care provider about changing or stopping any regular medicines.  Do not take aspirin or blood thinners for 1 week before the procedure, or as directed by your health care provider. These can cause bleeding.  If you smoke, do not smoke for 2 weeks before the procedure.  In some cases, a medicine is placed in the cervix the day before the procedure. This medicine makes the cervix have a larger opening (dilate). This makes it easier for the instrument to be inserted into the uterus during the  procedure.  Do not eat or drink anything for at least 8 hours before the surgery.  Arrange for someone to take you home after the procedure. PROCEDURE   You may be given a medicine to relax you (sedative). You may also be given one of the following:  A medicine that numbs the area around the cervix (local anesthetic).  A medicine that makes you sleep through the procedure (general anesthetic).  The hysteroscope is inserted through the vagina into the uterus. The camera on the hysteroscope sends a picture  to a TV screen. This gives the surgeon a good view inside the uterus.  During the procedure, air or a liquid is put into the uterus, which allows the surgeon to see better.  Sometimes, tissue is gently scraped from inside the uterus. These tissue samples are sent to a lab for testing. AFTER THE PROCEDURE   If you had a general anesthetic, you may be groggy for a couple hours after the procedure.  If you had a local anesthetic, you will be able to go home as soon as you are stable and feel ready.  You may have some cramping. This normally lasts for a couple days.  You may have bleeding, which varies from light spotting for a few days to menstrual-like bleeding for 3-7 days. This is normal.  If your test results are not back during the visit, make an appointment with your health care provider to find out the results.   This information is not intended to replace advice given to you by your health care provider. Make sure you discuss any questions you have with your health care provider.   Document Released: 07/08/2000 Document Revised: 01/20/2013 Document Reviewed: 10/29/2012 Elsevier Interactive Patient Education 2016 [place]. Hysteroscopy, Care After Refer to this sheet in the next few weeks. These instructions provide you with information on caring for yourself after your procedure. Your health care provider may also give you more specific instructions. Your treatment has been planned according to current medical practices, but problems sometimes occur. Call your health care provider if you have any problems or questions after your procedure.  WHAT TO EXPECT AFTER THE PROCEDURE After your procedure, it is typical to have the following:  You may have some cramping. This normally lasts for a couple days.  You may have bleeding. This can vary from light spotting for a few days to menstrual-like bleeding for 3-7 days. HOME CARE INSTRUCTIONS  Rest for the first 1-2 days after the  procedure.  Only take over-the-counter or prescription medicines as directed by your health care provider. Do not take aspirin. It can increase the chances of bleeding.  Take showers instead of baths for 2 weeks or as directed by your health care provider.  Do not drive for 24 hours or as directed.  Do not drink alcohol while taking pain medicine.  Do not use tampons, douche, or have sexual intercourse for 2 weeks or until your health care provider says it is okay.  Take your temperature twice a day for 4-5 days. Write it down each time.  Follow your health care provider's advice about diet, exercise, and lifting.  If you develop constipation, you may:  Take a mild laxative if your health care provider approves.  Add bran foods to your diet.  Drink enough fluids to keep your urine clear or pale yellow.  Try to have someone with you or available to you for the first 24-48 hours, especially if you were given  a general anesthetic.  Follow up with your health care provider as directed. SEEK MEDICAL CARE IF:  You feel dizzy or lightheaded.  You feel sick to your stomach (nauseous).  You have abnormal vaginal discharge.  You have a rash.  You have pain that is not controlled with medicine. SEEK IMMEDIATE MEDICAL CARE IF:  You have bleeding that is heavier than a normal menstrual period.  You have a fever.  You have increasing cramps or pain, not controlled with medicine.  You have new belly (abdominal) pain.  You pass out.  You have pain in the tops of your shoulders (shoulder strap areas).  You have shortness of breath.   This information is not intended to replace advice given to you by your health care provider. Make sure you discuss any questions you have with your health care provider.   Document Released: 01/20/2013 Document Reviewed: 01/20/2013 Elsevier Interactive Patient Education 2016 [place].  Endometrial Ablation Endometrial ablation removes the  lining of the uterus (endometrium). It is usually a same-day, outpatient treatment. Ablation helps avoid major surgery, such as surgery to remove the cervix and uterus (hysterectomy). After endometrial ablation, you will have little or no menstrual bleeding and may not be able to have children. However, if you are premenopausal, you will need to use a reliable method of birth control following the procedure because of the small chance that pregnancy can occur. There are different reasons to have this procedure. These reasons include:  Heavy periods.  Bleeding that is causing anemia.  Irregular bleeding.  Bleeding fibroids on the lining inside the uterus if they are smaller than 3 centimeters. This procedure may not be possible for you if:   You want to have children in the future.   You have severe cramps with your menstrual period.   You have precancerous or cancerous cells in your uterus.   You were recently pregnant.   You have gone through menopause.   You have had major surgery on your uterus, resulting in thinning of the uterine wall. Surgeries may include:  The removal of one or more uterine fibroids (myomectomy).  A cesarean section with a classic (vertical) incision on your uterus. Ask your health care provider what type of cesarean you had. Sometimes the scar on your skin is different than the scar on your uterus. Even if you have had surgery on your uterus, certain types of ablation may still be safe for you. Talk with your health care provider. LET [facility] CARE PROVIDER KNOW ABOUT:  Any allergies you have.  All medicines you are taking, including vitamins, herbs, eye drops, creams, and over-the-counter medicines.  Previous problems you or members of your family have had with the use of anesthetics.  Any blood disorders you have.  Previous surgeries you have had.  Medical conditions you have. RISKS AND COMPLICATIONS  Generally, this is a safe procedure.  However, as with any procedure, complications can occur. Possible complications include:  Perforation of the uterus.  Bleeding.  Infection of the uterus, bladder, or vagina.  Injury to surrounding organs.  An air bubble to the lung (air embolus).  Pregnancy following the procedure.  Failure of the procedure to help the problem, requiring hysterectomy.  Decreased ability to diagnose cancer in the lining of the uterus. BEFORE THE PROCEDURE  The lining of the uterus must be tested to make sure there is no pre-cancerous or cancer cells present.  An ultrasound may be performed to look at the size  of the uterus and to check for abnormalities.  Medicines may be given to thin the lining of the uterus. PROCEDURE  During the procedure, your health care provider will use a tool called a resectoscope to help see inside your uterus. There are different ways to remove the lining of your uterus.   Radiofrequency - This method uses a radiofrequency-alternating electric current to remove the lining of the uterus.  Cryotherapy - This method uses extreme cold to freeze the lining of the uterus.  Heated-Free Liquid - This method uses heated salt (saline) solution to remove the lining of the uterus.  Microwave - This method uses high-energy microwaves to heat up the lining of the uterus to remove it.  Thermal balloon - This method involves inserting a catheter with a balloon tip into the uterus. The balloon tip is filled with heated fluid to remove the lining of the uterus. AFTER THE PROCEDURE  After your procedure, do not have sexual intercourse or insert anything into your vagina until permitted by your health care provider. After the procedure, you may experience:  Cramps.  Vaginal discharge.  Frequent urination.   This information is not intended to replace advice given to you by your health care provider. Make sure you discuss any questions you have with your health care provider.     Document Released: 02/09/2004 Document Revised: 12/21/2014 Document Reviewed: 09/02/2012 Elsevier Interactive Patient Education 2016 Elsevier Inc. Dilation and Curettage or Vacuum Curettage Dilation and curettage (D&C) and vacuum curettage are minor procedures. A D&C involves stretching (dilation) the cervix and scraping (curettage) the inside lining of the womb (uterus). During a D&C, tissue is gently scraped from the inside lining of the uterus. During a vacuum curettage, the lining and tissue in the uterus are removed with the use of gentle suction.  Curettage may be performed to either diagnose or treat a problem. As a diagnostic procedure, curettage is performed to examine tissues from the uterus. A diagnostic curettage may be performed for the following symptoms:   Irregular bleeding in the uterus.   Bleeding with the development of clots.   Spotting between menstrual periods.   Prolonged menstrual periods.   Bleeding after menopause.   No menstrual period (amenorrhea).   A change in size and shape of the uterus.  As a treatment procedure, curettage may be performed for the following reasons:   Removal of an IUD (intrauterine device).   Removal of retained placenta after giving birth. Retained placenta can cause an infection or bleeding severe enough to require transfusions.   Abortion.   Miscarriage.   Removal of polyps inside the uterus.   Removal of uncommon types of noncancerous lumps (fibroids).  LET [facility] CARE PROVIDER KNOW ABOUT:   Any allergies you have.   All medicines you are taking, including vitamins, herbs, eye drops, creams, and over-the-counter medicines.   Previous problems you or members of your family have had with the use of anesthetics.   Any blood disorders you have.   Previous surgeries you have had.   Medical conditions you have. RISKS AND COMPLICATIONS  Generally, this is a safe procedure. However, as with any  procedure, complications can occur. Possible complications include:  Excessive bleeding.   Infection of the uterus.   Damage to the cervix.   Development of scar tissue (adhesions) inside the uterus, later causing abnormal amounts of menstrual bleeding.   Complications from the general anesthetic, if a general anesthetic is used.   Putting a hole (  perforation) in the uterus. This is rare.  BEFORE THE PROCEDURE   Eat and drink before the procedure only as directed by your health care provider.   Arrange for someone to take you home.  PROCEDURE  This procedure usually takes about 15-30 minutes.  You will be given one of the following:  A medicine that numbs the area in and around the cervix (local anesthetic).   A medicine to make you sleep through the procedure (general anesthetic).  You will lie on your back with your legs in stirrups.   A warm metal or plastic instrument (speculum) will be placed in your vagina to keep it open and to allow the health care provider to see the cervix.  There are two ways in which your cervix can be softened and dilated. These include:   Taking a medicine.   Having thin rods (laminaria) inserted into your cervix.   A curved tool (curette) will be used to scrape cells from the inside lining of the uterus. In some cases, gentle suction is applied with the curette. The curette will then be removed.  AFTER THE PROCEDURE   You will rest in the recovery area until you are stable and are ready to go home.   You may feel sick to your stomach (nauseous) or throw up (vomit) if you were given a general anesthetic.   You may have a sore throat if a tube was placed in your throat during general anesthesia.   You may have light cramping and bleeding. This may last for 2 days to 2 weeks after the procedure.   Your uterus needs to make a new lining after the procedure. This may make your next period late.   This information is not  intended to replace advice given to you by your health care provider. Make sure you discuss any questions you have with your health care provider.   Document Released: 04/01/2005 Document Revised: 12/02/2012 Document Reviewed: 10/29/2012 Elsevier Interactive Patient Education 2016 Elsevier Inc.  Dilation and Curettage or Vacuum Curettage, Care After These instructions give you information on caring for yourself after your procedure. Your doctor may also give you more specific instructions. Call your doctor if you have any problems or questions after your procedure. HOME CARE  Do not drive for 24 hours.  Wait 1 week before doing any activities that wear you out.  Take your temperature 2 times a day for 4 days. Write it down. Tell your doctor if you have a fever.  Do not stand for a long time.  Do not lift, push, or pull anything over 10 pounds (4.5 kilograms).  Limit stair climbing to once or twice a day.  Rest often.  Continue with your usual diet.  Drink enough fluids to keep your pee (urine) clear or pale yellow.  If you have a hard time pooping (constipation), you may:  Take a medicine to help you go poop (laxative) as told by your doctor.  Eat more fruit and bran.  Drink more fluids.  Take showers, not baths, for as long as told by your doctor.  Do not swim or use a hot tub until your doctor says it is okay.  Have someone with you for 1-2 days after the procedure.  Do not douche, use tampons, or have sex (intercourse) for 2 weeks.  Only take medicines as told by your doctor. Do not take aspirin. It can cause bleeding.  Keep all doctor visits. GET HELP IF:  You have  cramps or pain not helped by medicine.  You have new pain in the belly (abdomen).  You have a bad smelling fluid coming from your vagina.  You have a rash.  You have problems with any medicine. GET HELP RIGHT AWAY IF:   You start to bleed more than a regular period.  You have a  fever.  You have chest pain.  You have trouble breathing.  You feel dizzy or feel like passing out (fainting).  You pass out.  You have pain in the tops of your shoulders.  You have vaginal bleeding with or without clumps of blood (blood clots). MAKE SURE YOU:  Understand these instructions.  Will watch your condition.  Will get help right away if you are not doing well or get worse.   This information is not intended to replace advice given to you by your health care provider. Make sure you discuss any questions you have with your health care provider.   Document Released: 01/09/2008 Document Revised: 04/06/2013 Document Reviewed: 10/29/2012 Elsevier Interactive Patient Education 2016 [place] Anesthesia, Adult General anesthesia is a sleep-like state of non-feeling produced by medicines (anesthetics). General anesthesia prevents you from being alert and feeling pain during a medical procedure. Your caregiver may recommend general anesthesia if your procedure:  Is long.  Is painful or uncomfortable.  Would be frightening to see or hear.  Requires you to be still.  Affects your breathing.  Causes significant blood loss. LET YOUR CAREGIVER KNOW ABOUT:  Allergies to food or medicine.  Medicines taken, including vitamins, herbs, eyedrops, over-the-counter medicines, and creams.  Use of steroids (by mouth or creams).  Previous problems with anesthetics or numbing medicines, including problems experienced by relatives.  History of bleeding problems or blood clots.  Previous surgeries and types of anesthetics received.  Possibility of pregnancy, if this applies.  Use of cigarettes, alcohol, or illegal drugs.  Any health condition(s), especially diabetes, sleep apnea, and high blood pressure. RISKS AND COMPLICATIONS General anesthesia rarely causes complications. However, if complications do occur, they can be life threatening. Complications  include:  A lung infection.  A stroke.  A heart attack.  Waking up during the procedure. When this occurs, the patient may be unable to move and communicate that he or she is awake. The patient may feel severe pain. Older adults and adults with serious medical problems are more likely to have complications than adults who are young and healthy. Some complications can be prevented by answering all of your caregiver's questions thoroughly and by following all pre-procedure instructions. It is important to tell your caregiver if any of the pre-procedure instructions, especially those related to diet, were not followed. Any food or liquid in the stomach can cause problems when you are under general anesthesia. BEFORE THE PROCEDURE  Ask your caregiver if you will have to spend the night at the hospital. If you will not have to spend the night, arrange to have an adult drive you and stay with you for 24 hours.  Follow your caregiver's instructions if you are taking dietary supplements or medicines. Your caregiver may tell you to stop taking them or to reduce your dosage.  Do not smoke for as long as possible before your procedure. If possible, stop smoking 3-6 weeks before the procedure.  Do not take new dietary supplements or medicines within 1 week of your procedure unless your caregiver approves them.  Do not eat within 8 hours of your procedure or as directed  by your caregiver. Drink only clear liquids, such as water, black coffee (without milk or cream), and fruit juices (without pulp).  Do not drink within 3 hours of your procedure or as directed by your caregiver.  You may brush your teeth on the morning of the procedure, but make sure to spit out the toothpaste and water when finished. PROCEDURE  You will receive anesthetics through a mask, through an intravenous (IV) access tube, or through both. A doctor who specializes in anesthesia (anesthesiologist) or a nurse who specializes in  anesthesia (nurse anesthetist) or both will stay with you throughout the procedure to make sure you remain unconscious. He or she will also watch your blood pressure, pulse, and oxygen levels to make sure that the anesthetics do not cause any problems. Once you are asleep, a breathing tube or mask may be used to help you breathe. AFTER THE PROCEDURE You will wake up after the procedure is complete. You may be in the room where the procedure was performed or in a recovery area. You may have a sore throat if a breathing tube was used. You may also feel:  Dizzy.  Weak.  Drowsy.  Confused.  Nauseous.  Cold. These are all normal responses and can be expected to last for up to 24 hours after the procedure is complete. A caregiver will tell you when you are ready to go home. This will usually be when you are fully awake and in stable condition.   This information is not intended to replace advice given to you by your health care provider. Make sure you discuss any questions you have with your health care provider.   Document Released: 07/09/2007 Document Revised: 04/22/2014 Document Reviewed: 07/31/2011 Elsevier Interactive Patient Education 2016 [place] Anesthesia, Adult, Care After Refer to this sheet in the next few weeks. These instructions provide you with information on caring for yourself after your procedure. Your health care provider may also give you more specific instructions. Your treatment has been planned according to current medical practices, but problems sometimes occur. Call your health care provider if you have any problems or questions after your procedure. WHAT TO EXPECT AFTER THE PROCEDURE After the procedure, it is typical to experience:  Sleepiness.  Nausea and vomiting. HOME CARE INSTRUCTIONS  For the first 24 hours after general anesthesia:  Have a responsible person with you.  Do not drive a car. If you are alone, do not take public  transportation.  Do not drink alcohol.  Do not take medicine that has not been prescribed by your health care provider.  Do not sign important papers or make important decisions.  You may resume a normal diet and activities as directed by your health care provider.  Change bandages (dressings) as directed.  If you have questions or problems that seem related to general anesthesia, call the hospital and ask for the anesthetist or anesthesiologist on call. SEEK MEDICAL CARE IF:  You have nausea and vomiting that continue the day after anesthesia.  You develop a rash. SEEK IMMEDIATE MEDICAL CARE IF:   You have difficulty breathing.  You have chest pain.  You have any allergic problems.   This information is not intended to replace advice given to you by your health care provider. Make sure you discuss any questions you have with your health care provider.   Document Released: 07/08/2000 Document Revised: 04/22/2014 Document Reviewed: 07/31/2011 Elsevier Interactive Patient Education 2016 [employer] Inc[employer] PATIENT INSTRUCTIONS POST-ANESTHESIA  IMMEDIATELY FOLLOWING SURGERY:  Do not drive or operate machinery for the first twenty four hours after surgery.  Do not make any important decisions for twenty four hours after surgery or while taking narcotic pain medications or sedatives.  If you develop intractable nausea and vomiting or a severe headache please notify your doctor immediately.  FOLLOW-UP:  Please make an appointment with your surgeon as instructed. You do not need to follow up with anesthesia unless specifically instructed to do so.  WOUND CARE INSTRUCTIONS (if applicable):  Keep a dry clean dressing on the anesthesia/puncture wound site if there is drainage.  Once the wound has quit draining you may leave it open to air.  Generally you should leave the bandage intact for twenty four hours unless there is drainage.  If the epidural site drains for more than 36-48 hours please  call the anesthesia department.  QUESTIONS?:  Please feel free to call your physician or the hospital operator if you have any questions, and they will be happy to assist you.

## 2016-02-21 ENCOUNTER — Other Ambulatory Visit: Payer: Self-pay | Admitting: Obstetrics and Gynecology

## 2016-02-22 ENCOUNTER — Other Ambulatory Visit: Payer: Self-pay

## 2016-02-22 ENCOUNTER — Encounter (HOSPITAL_COMMUNITY)
Admission: RE | Admit: 2016-02-22 | Discharge: 2016-02-22 | Disposition: A | Discharge status: Home / Self Care | Payer: No Typology Code available for payment source | Source: Ambulatory Visit | Attending: Obstetrics and Gynecology | Admitting: Obstetrics and Gynecology

## 2016-02-22 ENCOUNTER — Encounter (HOSPITAL_COMMUNITY): Payer: Self-pay

## 2016-02-22 DIAGNOSIS — Z01810 Encounter for preprocedural cardiovascular examination: Secondary | ICD-10-CM | POA: Insufficient documentation

## 2016-02-22 DIAGNOSIS — Z01812 Encounter for preprocedural laboratory examination: Secondary | ICD-10-CM | POA: Diagnosis not present

## 2016-02-22 DIAGNOSIS — I10 Essential (primary) hypertension: Secondary | ICD-10-CM | POA: Insufficient documentation

## 2016-02-22 LAB — CBC
HEMATOCRIT: 38 % (ref 36.0–46.0)
HEMOGLOBIN: 12.6 g/dL (ref 12.0–15.0)
MCH: 30.6 pg (ref 26.0–34.0)
MCHC: 33.2 g/dL (ref 30.0–36.0)
MCV: 92.2 fL (ref 78.0–100.0)
Platelets: 255 10*3/uL (ref 150–400)
RBC: 4.12 MIL/uL (ref 3.87–5.11)
RDW: 13.1 % (ref 11.5–15.5)
WBC: 9.5 10*3/uL (ref 4.0–10.5)

## 2016-02-22 LAB — HCG, SERUM, QUALITATIVE: Preg, Serum: NEGATIVE

## 2016-02-22 LAB — URINALYSIS, ROUTINE W REFLEX MICROSCOPIC
BILIRUBIN URINE: NEGATIVE
Glucose, UA: NEGATIVE mg/dL
HGB URINE DIPSTICK: NEGATIVE
KETONES UR: NEGATIVE mg/dL
Leukocytes, UA: NEGATIVE
Nitrite: NEGATIVE
PROTEIN: NEGATIVE mg/dL
SPECIFIC GRAVITY, URINE: 1.015 (ref 1.005–1.030)
pH: 5.5 (ref 5.0–8.0)

## 2016-02-22 LAB — BASIC METABOLIC PANEL
Anion gap: 4 — ABNORMAL LOW (ref 5–15)
BUN: 14 mg/dL (ref 6–20)
CHLORIDE: 107 mmol/L (ref 101–111)
CO2: 25 mmol/L (ref 22–32)
CREATININE: 0.78 mg/dL (ref 0.44–1.00)
Calcium: 9.3 mg/dL (ref 8.9–10.3)
GFR calc Af Amer: >60 mL/min (ref >60)
GFR calc non Af Amer: >60 mL/min (ref >60)
GLUCOSE: 102 mg/dL — AB (ref 65–99)
POTASSIUM: 4 mmol/L (ref 3.5–5.1)
Sodium: 136 mmol/L (ref 135–145)

## 2016-02-22 LAB — TYPE AND SCREEN
ABO/RH(D): O NEG
ANTIBODY SCREEN: NEGATIVE

## 2016-02-23 LAB — RPR: RPR Ser Ql: NONREACTIVE

## 2016-02-26 NOTE — H&P (Signed)
Patient ID: Kaylee Ochoa, female   DOB: [birth year], 50 y.o.   MRN: [id number]   [facility] Visit  @DATE @            Patient name: Kaylee Ochoa   MRN [id number]  Date of birth: [birth year]  CC & HPI:      Chief Complaint  Patient presents with  . discuss surgery     Kaylee Ochoa is a 50 y.o. female presenting today for discussion of surgery for management of dysmenorrhea. Pt states she takes the mini pill continuously and her pain is still very intense despite having a very light flow. She states her most recent period caused her so much pain that she was lying on the floor and vomiting.   Transvaginal US on 01/19/16 showed linear striations in the myometrium (? Adenomyosis), 3.7 mm endometrium, ovaries wnl  ROS:  Review of Systems  Genitourinary:       +dysmenorrhea     Pertinent History Reviewed:   Reviewed  Medical             Past Medical History:  Diagnosis Date  . Depression   . ETOH abuse   . Hypertension   . Renal disorder                               Surgical Hx:         Past Surgical History:  Procedure Laterality Date  . BREAST SURGERY    . cyst removal on breast     Medications: Reviewed & Updated - see associated section                       Current Outpatient Prescriptions:  .  albuterol (PROVENTIL HFA;VENTOLIN HFA) 108 (90 BASE) MCG/ACT inhaler, Inhale 2 puffs into the lungs every 6 (six) hours as needed for wheezing or shortness of breath., Disp: 1 Inhaler, Rfl: 0 .  busPIRone (BUSPAR) 10 MG tablet, Take 10 mg by mouth 3 (three) times daily., Disp: , Rfl: 2 .  lisinopril (PRINIVIL,ZESTRIL) 40 MG tablet, Take 40 mg by mouth daily., Disp: , Rfl: 6 .  mirtazapine (REMERON) 15 MG tablet, Take 15 mg by mouth at bedtime., Disp: , Rfl: 2 .  naproxen (NAPROSYN) 500 MG tablet, TAKE 1 Tablet  BY MOUTH TWICE DAILY, Disp: , Rfl: 3 .  nicotine (NICODERM CQ - DOSED IN MG/24 HOURS) 21 mg/24hr patch, Place 1 patch (21 mg total)  onto the skin daily., Disp: 28 patch, Rfl: 0 .  norethindrone (MICRONOR,CAMILA,ERRIN) 0.35 MG tablet, Take 1 tablet by mouth daily., Disp: , Rfl:  .  HYDROcodone-acetaminophen (NORCO) 7.5-325 MG tablet, Take 1 tablet by mouth every 6 (six) hours as needed for moderate pain (Must last 30 days.Do not drive or operate machinery while taking this medicine.). (Patient not taking: Reported on 01/31/2016), Disp: 100 tablet, Rfl: 0 .  ketorolac (TORADOL) 10 MG tablet, Take 1 tablet (10 mg total) by mouth every 6 (six) hours as needed. (Patient not taking: Reported on 01/31/2016), Disp: 20 tablet, Rfl: 0 .  promethazine (PHENERGAN) 25 MG tablet, Take 1 tablet (25 mg total) by mouth every 6 (six) hours as needed for nausea or vomiting. (Patient not taking: Reported on 01/31/2016), Disp: 30 tablet, Rfl: 1   Social History: Reviewed -  reports that she has been smoking Cigarettes.  She has a 5.00 pack-year smoking history. She  has never used smokeless tobacco.  Objective Findings:  Vitals: Blood pressure 116/72, pulse 80, height 5\' 7"  (1.702 m), weight 146 lb 12.8 oz (66.6 kg), last menstrual period 01/15/2016.  Physical Examination: Discussion   Discussed with pt risks and benefits of endometrial ablation vs supracervical hysterectomy. Advised pt that ablation would have ~80% success rate in her case. At end of discussion, pt had opportunity to ask questions and has no further questions at this time.   Greater than 50% was spent in counseling and coordination of care with the patient. Total time greater than: 25 minutes    Assessment & Plan:   A:  1. Dysmenorrhea  2. Adenomyosis vs cervical stenosis   Plan: Hysteroscopy, dilation and curettage, endometrial ablation.

## 2016-02-27 ENCOUNTER — Ambulatory Visit (HOSPITAL_COMMUNITY): Payer: Self-pay | Admitting: Anesthesiology

## 2016-02-27 ENCOUNTER — Encounter (HOSPITAL_COMMUNITY)
Admission: RE | Disposition: A | Discharge status: Home / Self Care | Payer: Self-pay | Source: Ambulatory Visit | Attending: Obstetrics and Gynecology

## 2016-02-27 ENCOUNTER — Ambulatory Visit (HOSPITAL_COMMUNITY)
Admission: RE | Admit: 2016-02-27 | Discharge: 2016-02-27 | Disposition: A | Discharge status: Home / Self Care | Payer: PRIVATE HEALTH INSURANCE | Source: Ambulatory Visit | Attending: Obstetrics and Gynecology | Admitting: Obstetrics and Gynecology

## 2016-02-27 ENCOUNTER — Encounter: Payer: Self-pay | Admitting: Orthopaedic Surgery

## 2016-02-27 ENCOUNTER — Encounter (HOSPITAL_COMMUNITY): Payer: Self-pay | Admitting: *Deleted

## 2016-02-27 DIAGNOSIS — D251 Intramural leiomyoma of uterus: Secondary | ICD-10-CM | POA: Insufficient documentation

## 2016-02-27 DIAGNOSIS — Z79899 Other long term (current) drug therapy: Secondary | ICD-10-CM | POA: Insufficient documentation

## 2016-02-27 DIAGNOSIS — N840 Polyp of corpus uteri: Secondary | ICD-10-CM | POA: Insufficient documentation

## 2016-02-27 DIAGNOSIS — Z302 Encounter for sterilization: Secondary | ICD-10-CM | POA: Diagnosis not present

## 2016-02-27 DIAGNOSIS — I10 Essential (primary) hypertension: Secondary | ICD-10-CM | POA: Insufficient documentation

## 2016-02-27 DIAGNOSIS — N852 Hypertrophy of uterus: Secondary | ICD-10-CM | POA: Diagnosis not present

## 2016-02-27 DIAGNOSIS — N736 Female pelvic peritoneal adhesions (postinfective): Secondary | ICD-10-CM | POA: Insufficient documentation

## 2016-02-27 DIAGNOSIS — N946 Dysmenorrhea, unspecified: Secondary | ICD-10-CM | POA: Diagnosis present

## 2016-02-27 DIAGNOSIS — N920 Excessive and frequent menstruation with regular cycle: Secondary | ICD-10-CM | POA: Insufficient documentation

## 2016-02-27 DIAGNOSIS — Z793 Long term (current) use of hormonal contraceptives: Secondary | ICD-10-CM | POA: Insufficient documentation

## 2016-02-27 DIAGNOSIS — D259 Leiomyoma of uterus, unspecified: Secondary | ICD-10-CM

## 2016-02-27 DIAGNOSIS — F17210 Nicotine dependence, cigarettes, uncomplicated: Secondary | ICD-10-CM | POA: Insufficient documentation

## 2016-02-27 DIAGNOSIS — N838 Other noninflammatory disorders of ovary, fallopian tube and broad ligament: Secondary | ICD-10-CM | POA: Insufficient documentation

## 2016-02-27 HISTORY — PX: LAPAROSCOPIC BILATERAL SALPINGECTOMY: SHX5889

## 2016-02-27 HISTORY — PX: DILITATION & CURRETTAGE/HYSTROSCOPY WITH NOVASURE ABLATION: SHX5568

## 2016-02-27 SURGERY — DILATATION & CURETTAGE/HYSTEROSCOPY WITH NOVASURE ABLATION
Anesthesia: General

## 2016-02-27 MED ORDER — FENTANYL CITRATE (PF) 100 MCG/2ML IJ SOLN
INTRAMUSCULAR | Status: DC | PRN
  Administered 2016-02-27 (×4): 50 ug via INTRAVENOUS
  Administered 2016-02-27: 100 ug via INTRAVENOUS

## 2016-02-27 MED ORDER — ROCURONIUM BROMIDE 50 MG/5ML IV SOLN
INTRAVENOUS | Status: AC
  Filled 2016-02-27: qty 3

## 2016-02-27 MED ORDER — DEXAMETHASONE SODIUM PHOSPHATE 4 MG/ML IJ SOLN
4.0000 mg | INTRAMUSCULAR | Status: AC
  Administered 2016-02-27: 4 mg via INTRAVENOUS

## 2016-02-27 MED ORDER — MIDAZOLAM HCL 2 MG/2ML IJ SOLN
INTRAMUSCULAR | Status: AC
  Filled 2016-02-27: qty 2

## 2016-02-27 MED ORDER — GLYCOPYRROLATE 0.2 MG/ML IJ SOLN
INTRAMUSCULAR | Status: AC
  Filled 2016-02-27: qty 3

## 2016-02-27 MED ORDER — ONDANSETRON HCL 4 MG/2ML IJ SOLN
INTRAMUSCULAR | Status: AC
  Filled 2016-02-27: qty 2

## 2016-02-27 MED ORDER — MIDAZOLAM HCL 2 MG/2ML IJ SOLN
1.0000 mg | INTRAMUSCULAR | Status: DC | PRN
  Administered 2016-02-27: 1 mg via INTRAVENOUS

## 2016-02-27 MED ORDER — EPHEDRINE SULFATE 50 MG/ML IJ SOLN
INTRAMUSCULAR | Status: AC
  Filled 2016-02-27: qty 1

## 2016-02-27 MED ORDER — BUPIVACAINE-EPINEPHRINE (PF) 0.25% -1:200000 IJ SOLN
INTRAMUSCULAR | Status: AC
  Filled 2016-02-27: qty 30

## 2016-02-27 MED ORDER — GLYCOPYRROLATE 0.2 MG/ML IJ SOLN
INTRAMUSCULAR | Status: DC | PRN
  Administered 2016-02-27: 0.6 mg via INTRAVENOUS

## 2016-02-27 MED ORDER — NEOSTIGMINE METHYLSULFATE 10 MG/10ML IV SOLN
INTRAVENOUS | Status: DC | PRN
  Administered 2016-02-27: 4 mg via INTRAVENOUS

## 2016-02-27 MED ORDER — ROCURONIUM BROMIDE 100 MG/10ML IV SOLN
INTRAVENOUS | Status: DC | PRN
  Administered 2016-02-27: 35 mg via INTRAVENOUS
  Administered 2016-02-27: 5 mg via INTRAVENOUS

## 2016-02-27 MED ORDER — FENTANYL CITRATE (PF) 100 MCG/2ML IJ SOLN
INTRAMUSCULAR | Status: AC
  Filled 2016-02-27: qty 2

## 2016-02-27 MED ORDER — 0.9 % SODIUM CHLORIDE (POUR BTL) OPTIME
TOPICAL | Status: DC | PRN
  Administered 2016-02-27: 1000 mL

## 2016-02-27 MED ORDER — LIDOCAINE HCL (CARDIAC) 10 MG/ML IV SOLN
INTRAVENOUS | Status: DC | PRN
  Administered 2016-02-27: 50 mg via INTRAVENOUS

## 2016-02-27 MED ORDER — FENTANYL CITRATE (PF) 100 MCG/2ML IJ SOLN: 25.0000 ug | INTRAMUSCULAR | Status: DC | PRN

## 2016-02-27 MED ORDER — FENTANYL CITRATE (PF) 100 MCG/2ML IJ SOLN
25.0000 ug | INTRAMUSCULAR | Status: DC | PRN
  Administered 2016-02-27: 25 ug via INTRAVENOUS

## 2016-02-27 MED ORDER — PROPOFOL 10 MG/ML IV BOLUS
INTRAVENOUS | Status: DC | PRN
  Administered 2016-02-27: 150 mg via INTRAVENOUS

## 2016-02-27 MED ORDER — SODIUM CHLORIDE 0.9 % IR SOLN
Status: DC | PRN
  Administered 2016-02-27: 3000 mL

## 2016-02-27 MED ORDER — HYDROMORPHONE HCL 1 MG/ML IJ SOLN
INTRAMUSCULAR | Status: AC
  Filled 2016-02-27: qty 0.5

## 2016-02-27 MED ORDER — OXYCODONE-ACETAMINOPHEN 5-325 MG PO TABS: 1.0000 | ORAL_TABLET | ORAL | 0 refills | Status: DC | PRN

## 2016-02-27 MED ORDER — HYDROMORPHONE HCL 1 MG/ML IJ SOLN
0.5000 mg | INTRAMUSCULAR | Status: DC | PRN
  Administered 2016-02-27 (×2): 0.5 mg via INTRAVENOUS
  Filled 2016-02-27: qty 0.5

## 2016-02-27 MED ORDER — DEXAMETHASONE SODIUM PHOSPHATE 4 MG/ML IJ SOLN
INTRAMUSCULAR | Status: AC
  Filled 2016-02-27: qty 1

## 2016-02-27 MED ORDER — NEOSTIGMINE METHYLSULFATE 10 MG/10ML IV SOLN
INTRAVENOUS | Status: AC
  Filled 2016-02-27: qty 1

## 2016-02-27 MED ORDER — ONDANSETRON HCL 4 MG/2ML IJ SOLN
4.0000 mg | Freq: Once | INTRAMUSCULAR | Status: AC
  Administered 2016-02-27: 4 mg via INTRAVENOUS

## 2016-02-27 MED ORDER — LACTATED RINGERS IV SOLN
INTRAVENOUS | Status: DC
  Administered 2016-02-27 (×2): via INTRAVENOUS

## 2016-02-27 MED ORDER — PROPOFOL 10 MG/ML IV BOLUS
INTRAVENOUS | Status: AC
  Filled 2016-02-27: qty 20

## 2016-02-27 MED ORDER — KETOROLAC TROMETHAMINE 10 MG PO TABS: 10.0000 mg | ORAL_TABLET | Freq: Four times a day (QID) | ORAL | 0 refills | Status: DC | PRN

## 2016-02-27 MED ORDER — BUPIVACAINE-EPINEPHRINE (PF) 0.25% -1:200000 IJ SOLN
INTRAMUSCULAR | Status: DC | PRN
  Administered 2016-02-27: 20 mL

## 2016-02-27 SURGICAL SUPPLY — 44 items
ABLATOR ENDOMETRIAL BIPOLAR (ABLATOR) ×3 IMPLANT
BAG HAMPER (MISCELLANEOUS) ×3 IMPLANT
BANDAGE STRIP 1X3 FLEXIBLE (GAUZE/BANDAGES/DRESSINGS) ×9 IMPLANT
BLADE SURG SZ11 CARB STEEL (BLADE) ×3 IMPLANT
CATH ROBINSON RED A/P 16FR (CATHETERS) ×3 IMPLANT
CLOSURE STERI-STRIP 1/4X4 (GAUZE/BANDAGES/DRESSINGS) ×3 IMPLANT
CLOTH BEACON ORANGE TIMEOUT ST (SAFETY) ×3 IMPLANT
COVER LIGHT HANDLE STERIS (MISCELLANEOUS) ×6 IMPLANT
DECANTER SPIKE VIAL GLASS SM (MISCELLANEOUS) ×3 IMPLANT
DURAPREP 26ML APPLICATOR (WOUND CARE) ×3 IMPLANT
ELECT REM PT RETURN 9FT ADLT (ELECTROSURGICAL) ×3
ELECTRODE REM PT RTRN 9FT ADLT (ELECTROSURGICAL) ×1 IMPLANT
FORMALIN 10 PREFIL 120ML (MISCELLANEOUS) ×6 IMPLANT
GLOVE BIOGEL PI IND STRL 7.0 (GLOVE) ×3 IMPLANT
GLOVE BIOGEL PI IND STRL 9 (GLOVE) ×1 IMPLANT
GLOVE BIOGEL PI INDICATOR 7.0 (GLOVE) ×6
GLOVE BIOGEL PI INDICATOR 9 (GLOVE) ×2
GLOVE ECLIPSE 6.5 STRL STRAW (GLOVE) ×3 IMPLANT
GLOVE ECLIPSE 9.0 STRL (GLOVE) ×6 IMPLANT
GOWN SPEC L3 XXLG W/TWL (GOWN DISPOSABLE) ×3 IMPLANT
GOWN STRL REUS W/TWL LRG LVL3 (GOWN DISPOSABLE) ×3 IMPLANT
INST SET HYSTEROSCOPY (KITS) ×3 IMPLANT
INST SET LAPROSCOPIC GYN AP (KITS) ×3 IMPLANT
IV NS IRRIG 3000ML ARTHROMATIC (IV SOLUTION) ×3 IMPLANT
KIT ROOM TURNOVER AP CYSTO (KITS) ×3 IMPLANT
KIT ROOM TURNOVER APOR (KITS) ×3 IMPLANT
MANIFOLD NEPTUNE II (INSTRUMENTS) ×3 IMPLANT
NEEDLE INSUFFLATION 120MM (ENDOMECHANICALS) ×3 IMPLANT
NS IRRIG 1000ML POUR BTL (IV SOLUTION) ×6 IMPLANT
PACK PERI GYN (CUSTOM PROCEDURE TRAY) ×3 IMPLANT
PAD ARMBOARD 7.5X6 YLW CONV (MISCELLANEOUS) ×3 IMPLANT
PAD TELFA 3X4 1S STER (GAUZE/BANDAGES/DRESSINGS) ×3 IMPLANT
SET BASIN LINEN APH (SET/KITS/TRAYS/PACK) ×3 IMPLANT
SET IRRIG Y TYPE TUR BLADDER L (SET/KITS/TRAYS/PACK) ×3 IMPLANT
SHEARS HARMONIC ACE PLUS 36CM (ENDOMECHANICALS) ×3 IMPLANT
SLEEVE ENDOPATH XCEL 5M (ENDOMECHANICALS) ×3 IMPLANT
SOLUTION ANTI FOG 6CC (MISCELLANEOUS) ×3 IMPLANT
SUT VIC AB 4-0 PS2 27 (SUTURE) ×3 IMPLANT
SYR 30ML LL (SYRINGE) ×3 IMPLANT
SYR CONTROL 10ML LL (SYRINGE) ×3 IMPLANT
SYRINGE 10CC LL (SYRINGE) ×3 IMPLANT
TROCAR XCEL NON-BLD 5MMX100MML (ENDOMECHANICALS) ×3 IMPLANT
TUBING INSUFFLATION (TUBING) ×3 IMPLANT
WARMER LAPAROSCOPE (MISCELLANEOUS) ×3 IMPLANT

## 2016-02-27 NOTE — Brief Op Note (Signed)
02/27/2016  10:29 AM  PATIENT:  Kaylee Ochoa  50 y.o. female  PRE-OPERATIVE DIAGNOSIS:  heavy menses and dysmenorrhea  POST-OPERATIVE DIAGNOSIS:  heavy menses, dysmenorrhea, uterine fibroids intramural, Pelvic Adhesions  PROCEDURE:  Procedure(s): DILATATION & CURETTAGE/HYSTEROSCOPY  Procedure #2 (N/A) LAPAROSCOPIC BILATERAL SALPINGECTOMY; LAPAROSCOPIC LYSIS OF ADHESIONS Procedure #1 (N/A) Note unable to completely endometrial ablation due to rigid uterine walls with intramural fibroids SURGEON:  Surgeon(s) and Role:    * [name], MD - Primary  PHYSICIAN ASSISTANT:   ASSISTANTS: [place] CST   ANESTHESIA:   general and paracervical block  EBL:  Total I/O In: 1100 [I.V.:1100] Out: 250 [Urine:250]  BLOOD ADMINISTERED:none  DRAINS: none   LOCAL MEDICATIONS USED:  MARCAINE    and Amount: 20 ml  SPECIMEN:  Source of Specimen:  Endometrial curettings, bilateral fallopian tubes  DISPOSITION OF SPECIMEN:  PATHOLOGY  COUNTS:  YES  TOURNIQUET:  * No tourniquets in log *  DICTATION: .Dragon Dictation  PLAN OF CARE: Discharge to home after PACU  PATIENT DISPOSITION:  PACU - hemodynamically stable.   Delay start of Pharmacological VTE agent (>24hrs) due to surgical blood loss or risk of bleeding: not applicable Details of procedure: Patient was taken operating room prepped and draped for combined abdominal and vaginal procedure as per preoperative plan. Legs were in low lithotomy support. Timeout was conducted and procedure confirmed by surgical team. Thing and draping, the speculum was inserted cervix grasped with single-tooth tenaculum. Uterine support and cervical support was quite good.an infraumbilical vertical 1 center skin incision was made as well as as well as a suprapubic and right lower quadrant incision of similar length, with Veress needle used to achieve pneumoperitoneum under 6 mmHg pressure. 3 L CO2 was instilled and then laparoscopic 5 mm trocar placed  through the umbilicus confirming normal intraperitoneal location and no evidence of trauma associated with insertion suprapubic and right lower quadrant trochars were placed under direct visualization and attention directed to the to the pelvis. With the patient in Trendelenburg position attention was directed to the left fallopian tube which could be identified to its fimbriated end. The tube was noted to be sitting Family adherent to the top of the ovary into the lateral aspects of the plate fat surrounding the sigmoid colon. These adhesions were taken down sharply with harmonic scalpel being careful to stay away from the bowel and retroperitoneum. Tube could be completely mobilized and then was taken off by salpingectomy using harmonic scalpel as the energy source. Hemostasis was good. The adhesions around the left ovary were then addressed by traction and countertraction and necessary with harmonic scalpel. At the end of the lysis of adhesions to the left ovary was significantly more mobile and prior to procedure and the sigmoid colon was not attached overlapping the left adnexal structures.  Of note, the uterus was very firm and stiff with intramural fibroids at several locations. Photos were taken to document this . The right adnexa was inspected and found to be freely mobile and right salpingectomy performed in a similar fashion. The ends were extracted through the suprapubic trocar site and sent for histology confirmation. Sac was inspected and was essentially free of adhesions. There was not evidence of endometriosis in the cul-de-sac.  Deflation the abdomen followed, 120 cc of saline instilled in the abdomen, laparoscopic equipment removed and the abdomen closed with subcuticular 4-0 Vicryl at 3 sites and then Steri-Strips applied. Hysteroscopy: At this time speculum was reinserted cervix grasped on the anterior lip  with single-tooth tenaculum and uterus sounded to a depth of 7 cm in the anteflexed  position dilation was performed with its slow difficulty due to the firm thickness of the lower uterine segment, and we were eventually able to dilate the cervix to 25 [place]. Particular care was taken and in no time was there any suspicion of uterine perforation.  Hysteroscopy was then performed. Inserting the hysteroscope was difficult due to the very stiff thick uterus but we are able to identify the tubal ostia from below. Smooth sharp curettage was performed and obtained essentially no tissue. What small amount of tissue was obtained was sent as a specimen. \ The NovaSure endometrial ablation device was then prepared activated sounded with the endometrial cavity only measuring 4.5 cm. Upon inserting the endometrial ablation device and see attempting to seat it, The array could not be spread to the mandatory 2.5 cm or more necessary to activate the device. We were convinced of ablation device was in the proper place but that the intramural firmness in thickness was not allowing the array to spread and the properly seated the procedure could not be continued and no further efforts made at ablation . Paracervical block with 20 cc  of 1/2% Marcaine was then injected around the cervix and patient allowed to go recovery room in stable condition.   It should be noted the patient will be kept on oral contraceptives to try to suppress endometrium long-term, used as a continuous fashion. If this is not successful and dysmenorrhea control patient would be a candidate for supracervical hysterectomy probably laparoscopic.

## 2016-02-27 NOTE — Interval H&P Note (Signed)
History and Physical Interval Note:  02/27/2016 8:37 AM  Kaylee Ochoa  has presented today for surgery, with the diagnosis of dysmenorrhea  The various methods of treatment have been discussed with the patient and family. After consideration of risks, benefits and other options for treatment, the patient has consented to  Procedure(s): DILATATION & CURETTAGE/HYSTEROSCOPY WITH NOVASURE ENDOMETRIAL ABLATION (N/A)  AS WELL AS LAPAROSCOPIC BILATERAL SALPINGECTOMY as a surgical intervention .  The patient's history has been reviewed, patient examined, no change in status, stable for surgery.  I have reviewed the patient's chart and labs.  Questions were answered to the patient's satisfaction.     [name]

## 2016-02-27 NOTE — Anesthesia Preprocedure Evaluation (Addendum)
Anesthesia Evaluation  Patient identified by MRN, date of birth, ID band Patient awake    Reviewed: Allergy & Precautions, NPO status , Patient's Chart, lab work & pertinent test results  Airway Mallampati: I  TM Distance: >3 FB     Dental  (+) Teeth Intact, Implants,    Pulmonary Current Smoker,    breath sounds clear to auscultation       Cardiovascular hypertension,  Rhythm:Regular Rate:Normal     Neuro/Psych PSYCHIATRIC DISORDERS Depression    GI/Hepatic negative GI ROS, (+)     substance abuse  alcohol use,   Endo/Other    Renal/GU      Musculoskeletal   Abdominal   Peds  Hematology   Anesthesia Other Findings   Reproductive/Obstetrics                             Anesthesia Physical Anesthesia Plan  ASA: II  Anesthesia Plan: General   Post-op Pain Management:    Induction: Intravenous  Airway Management Planned: Oral ETT  Additional Equipment:   Intra-op Plan:   Post-operative Plan: Extubation in OR  Informed Consent: I have reviewed the patients History and Physical, chart, labs and discussed the procedure including the risks, benefits and alternatives for the proposed anesthesia with the patient or authorized representative who has indicated his/her understanding and acceptance.     Plan Discussed with:   Anesthesia Plan Comments:        Anesthesia Quick Evaluation

## 2016-02-27 NOTE — Discharge Instructions (Signed)
Endometrial Ablation °Endometrial ablation removes the lining of the uterus (endometrium). It is usually a same-day, outpatient treatment. Ablation helps avoid major surgery, such as surgery to remove the cervix and uterus (hysterectomy). After endometrial ablation, you will have little or no menstrual bleeding and may not be able to have children. However, if you are premenopausal, you will need to use a reliable method of birth control following the procedure because of the small chance that pregnancy can occur. °There are different reasons to have this procedure. These reasons include: °· Heavy periods. °· Bleeding that is causing anemia. °· Irregular bleeding. °· Bleeding fibroids on the lining inside the uterus if they are smaller than 3 centimeters. °This procedure may not be possible for you if:  °· You want to have children in the future.   °· You have severe cramps with your menstrual period.   °· You have precancerous or cancerous cells in your uterus.   °· You were recently pregnant.   °· You have gone through menopause.   °· You have had major surgery on your uterus, resulting in thinning of the uterine wall. Surgeries may include: °¨ The removal of one or more uterine fibroids (myomectomy). °¨ A cesarean section with a classic (vertical) incision on your uterus. Ask your health care provider what type of cesarean you had. Sometimes the scar on your skin is different than the scar on your uterus. °Even if you have had surgery on your uterus, certain types of ablation may still be safe for you. Talk with your health care provider. °LET YOUR HEALTH CARE PROVIDER KNOW ABOUT: °· Any allergies you have. °· All medicines you are taking, including vitamins, herbs, eye drops, creams, and over-the-counter medicines. °· Previous problems you or members of your family have had with the use of anesthetics. °· Any blood disorders you have. °· Previous surgeries you have had. °· Medical conditions you have. °RISKS AND  COMPLICATIONS  °Generally, this is a safe procedure. However, as with any procedure, complications can occur. Possible complications include: °· Perforation of the uterus. °· Bleeding. °· Infection of the uterus, bladder, or vagina. °· Injury to surrounding organs. °· An air bubble to the lung (air embolus). °· Pregnancy following the procedure. °· Failure of the procedure to help the problem, requiring hysterectomy. °· Decreased ability to diagnose cancer in the lining of the uterus. °BEFORE THE PROCEDURE °· The lining of the uterus must be tested to make sure there is no pre-cancerous or cancer cells present. °· An ultrasound may be performed to look at the size of the uterus and to check for abnormalities. °· Medicines may be given to thin the lining of the uterus. °PROCEDURE  °During the procedure, your health care provider will use a tool called a resectoscope to help see inside your uterus. There are different ways to remove the lining of your uterus.  °· Radiofrequency - This method uses a radiofrequency-alternating electric current to remove the lining of the uterus. °· Cryotherapy - This method uses extreme cold to freeze the lining of the uterus. °· Heated-Free Liquid - This method uses heated salt (saline) solution to remove the lining of the uterus. °· Microwave - This method uses high-energy microwaves to heat up the lining of the uterus to remove it. °· Thermal balloon - This method involves inserting a catheter with a balloon tip into the uterus. The balloon tip is filled with heated fluid to remove the lining of the uterus. °AFTER THE PROCEDURE  °After your procedure, do   not have sexual intercourse or insert anything into your vagina until permitted by your health care provider. After the procedure, you may experience:  Cramps.  Vaginal discharge.  Frequent urination. This information is not intended to replace advice given to you by your health care provider. Make sure you discuss any  questions you have with your health care provider. Document Released: 02/09/2004 Document Revised: 12/21/2014 Document Reviewed: 09/02/2012 Elsevier Interactive Patient Education  2017 [employer]  Laparoscopic Tubal Ligation, Care After Refer to this sheet in the next few weeks. These instructions provide you with information about caring for yourself after your procedure. Your health care provider may also give you more specific instructions. Your treatment has been planned according to current medical practices, but problems sometimes occur. Call your health care provider if you have any problems or questions after your procedure. What can I expect after the procedure? After the procedure, it is common to have:  A sore throat.  Discomfort in your shoulder.  Mild discomfort or cramping in your abdomen.  Gas pains.  Pain or soreness in the area where the surgical cut (incision) was made.  A bloated feeling.  Tiredness.  Nausea.  Vomiting. Follow these instructions at home: Medicines  Take over-the-counter and prescription medicines only as told by your health care provider.  Do not take aspirin because it can cause bleeding.  Do not drive or operate heavy machinery while taking prescription pain medicine. Activity  Rest for the rest of the day.  Return to your normal activities as told by your health care provider. Ask your health care provider what activities are safe for you. Incision care  Follow instructions from your health care provider about how to take care of your incision. Make sure you:  Wash your hands with soap and water before you change your bandage (dressing). If soap and water are not available, use hand sanitizer.  Change your dressing as told by your health care provider.  Leave stitches (sutures) in place. They may need to stay in place for 2 weeks or longer.  Check your incision area every day for signs of infection. Check for:  More  redness, swelling, or pain.  More fluid or blood.  Warmth.  Pus or a bad smell. Other Instructions  Do not take baths, swim, or use a hot tub until your health care provider approves. You may take showers.  Keep all follow-up visits as told by your health care provider. This is important.  Have someone help you with your daily household tasks for the first few days. Contact a health care provider if:  You have more redness, swelling, or pain around your incision.  Your incision feels warm to the touch.  You have pus or a bad smell coming from your incision.  The edges of your incision break open after the sutures have been removed.  Your pain does not improve after 2-3 days.  You have a rash.  You repeatedly become dizzy or light-headed.  Your pain medicine is not helping.  You are constipated. Get help right away if:  You have a fever.  You faint.  You have increasing pain in your abdomen.  You have severe pain in one or both of your shoulders.  You have fluid or blood coming from your sutures or from your vagina.  You have shortness of breath or difficulty breathing.  You have chest pain or leg pain.  You have ongoing nausea, vomiting, or diarrhea. This information is not  intended to replace advice given to you by your health care provider. Make sure you discuss any questions you have with your health care provider. Document Released: 10/19/2004 Document Revised: 09/04/2015 Document Reviewed: 03/12/2015 Elsevier Interactive Patient Education  2017 [employer].

## 2016-02-27 NOTE — Anesthesia Procedure Notes (Signed)
Procedure Name: Intubation Date/Time: 02/27/2016 9:03 AM Performed by: Gershon Mussel, Katura Eatherly Pre-anesthesia Checklist: Patient identified, Patient being monitored, Timeout performed, Emergency Drugs available and Suction available Patient Re-evaluated:Patient Re-evaluated prior to inductionOxygen Delivery Method: Circle System Utilized Preoxygenation: Pre-oxygenation with 100% oxygen Intubation Type: IV induction Ventilation: Mask ventilation without difficulty Laryngoscope Size: Miller and 2 Grade View: Grade I Tube type: Oral Tube size: 7.0 mm Number of attempts: 1 Airway Equipment and Method: Stylet Placement Confirmation: ETT inserted through vocal cords under direct vision,  positive ETCO2 and breath sounds checked- equal and bilateral Secured at: 21 cm Tube secured with: Tape Dental Injury: Teeth and Oropharynx as per pre-operative assessment

## 2016-02-27 NOTE — Op Note (Signed)
Please see the brief operative note for surgical details 

## 2016-02-27 NOTE — Transfer of Care (Signed)
Immediate Anesthesia Transfer of Care Note  Patient: Kaylee Ochoa  Procedure(s) Performed: Procedure(s): DILATATION & CURETTAGE/HYSTEROSCOPY  Procedure #2 (N/A) LAPAROSCOPIC BILATERAL SALPINGECTOMY; LAPAROSCOPIC LYSIS OF ADHESIONS Procedure #1 (N/A)  Patient Location: PACU  Anesthesia Type:General  Level of Consciousness: awake and alert   Airway & Oxygen Therapy: Patient Spontanous Breathing  Post-op Assessment: Report given to RN  Post vital signs: Reviewed and stable  Last Vitals:  Vitals:   02/27/16 0835 02/27/16 0840  BP: 111/60 (!) 99/58  Resp: 11 15  Temp:      Last Pain:  Vitals:   02/27/16 0744  TempSrc: Oral      Patients Stated Pain Goal: 4 ([id number])  Complications: No apparent anesthesia complications

## 2016-02-27 NOTE — Anesthesia Postprocedure Evaluation (Signed)
Anesthesia Post Note  Patient: Kaylee Ochoa  Procedure(s) Performed: Procedure(s) (LRB): DILATATION & CURETTAGE/HYSTEROSCOPY  Procedure #2 (N/A) LAPAROSCOPIC BILATERAL SALPINGECTOMY; LAPAROSCOPIC LYSIS OF ADHESIONS Procedure #1 (N/A)  Patient location during evaluation: PACU Anesthesia Type: General Level of consciousness: awake and alert Pain management: satisfactory to patient Vital Signs Assessment: post-procedure vital signs reviewed and stable Respiratory status: spontaneous breathing Cardiovascular status: stable Anesthetic complications: no    Last Vitals:  Vitals:   02/27/16 1100 02/27/16 1115  BP: (!) 87/44   Pulse: (!) 52 (!) 56  Resp: 10 (!) 9  Temp:      Last Pain:  Vitals:   02/27/16 1110  TempSrc:   PainSc: 6                  Shaunie Boehm

## 2016-02-28 ENCOUNTER — Encounter (HOSPITAL_COMMUNITY): Payer: Self-pay | Admitting: Obstetrics and Gynecology

## 2016-02-28 DIAGNOSIS — Z029 Encounter for administrative examinations, unspecified: Secondary | ICD-10-CM

## 2016-03-04 ENCOUNTER — Encounter: Payer: Self-pay | Admitting: Obstetrics and Gynecology

## 2016-03-04 ENCOUNTER — Ambulatory Visit (INDEPENDENT_AMBULATORY_CARE_PROVIDER_SITE_OTHER): Payer: PRIVATE HEALTH INSURANCE | Admitting: Obstetrics and Gynecology

## 2016-03-04 VITALS — BP 128/80 | HR 86 | Ht 67.0 in | Wt 156.2 lb

## 2016-03-04 DIAGNOSIS — Z09 Encounter for follow-up examination after completed treatment for conditions other than malignant neoplasm: Secondary | ICD-10-CM

## 2016-03-04 DIAGNOSIS — Z9889 Other specified postprocedural states: Secondary | ICD-10-CM

## 2016-03-04 NOTE — Progress Notes (Signed)
   Subjective:  Kaylee Ochoa is a 50 y.o. female now 6 days status post dilation and curettage/hysteroscopy, laparoscopic bilateral salpingectomy, and laparoscopic lysis of adhesions.   Surgery notable for unsuccessful endometrial ablation due to rigid uterine walls with intramural fibroids.    Review of Systems Negative, mild itching near RLQ incision site. +mild abdominal soreness   Diet:   normal   Bowel movements : normal.  Pain is controlled without any medications.  Objective:  LMP 01/17/2016 Comment: neg serum pregnancy 02/22/16 General:Well developed, well nourished.  No acute distress. Abdomen: Bowel sounds normal, soft, non-tender. Pelvic Exam: not indicated  Incision(s):   Healing well, no drainage, no erythema, no hernia, no swelling, no dehiscence,     Assessment:  Post-Op  6 days s/p dilation and curettage/hysteroscopy, laparoscopic bilateral salpingectomy, and laparoscopic lysis of adhesions.    Healing well postoperatively.  Discussed options for period pain management with ultimate supracervical hysterectomy as a last resort.    Plan:  1.Wound care discussed   2. Current medications. Taking OCP (progesterone) currently but will consider switching to Loestrin if pain returns with next menses.  3. Consider supracervical hysterectomy as a last resort 4. Activity restrictions: none 5. return to work: not applicable. 6. Follow up PRN  By signing my name below, I, Sonum Patel, attest that this documentation has been prepared under the direction and in the presence of [name], MD. Electronically Signed: Sonum Patel, Education administrator. 03/04/16. 4:08 PM.  I personally performed the services described in this documentation, which was SCRIBED in my presence. The recorded information has been reviewed and considered accurate. It has been edited as necessary during review. [name], MD

## 2016-03-06 ENCOUNTER — Telehealth: Payer: Self-pay | Admitting: Orthopaedic Surgery

## 2016-03-10 ENCOUNTER — Other Ambulatory Visit: Payer: Self-pay | Admitting: Orthopaedic Surgery

## 2016-03-12 MED ORDER — HYDROCODONE-ACETAMINOPHEN 7.5-325 MG PO TABS: 1.0000 | ORAL_TABLET | Freq: Four times a day (QID) | ORAL | 0 refills | Status: DC | PRN

## 2016-03-15 ENCOUNTER — Encounter: Payer: PRIVATE HEALTH INSURANCE | Admitting: Obstetrics and Gynecology

## 2016-04-03 ENCOUNTER — Ambulatory Visit (INDEPENDENT_AMBULATORY_CARE_PROVIDER_SITE_OTHER): Payer: No Typology Code available for payment source | Admitting: Orthopaedic Surgery

## 2016-04-03 VITALS — BP 119/69 | HR 81 | Temp 97.3°F | Ht 67.0 in | Wt 153.0 lb

## 2016-04-03 DIAGNOSIS — M545 Low back pain, unspecified: Secondary | ICD-10-CM

## 2016-04-03 DIAGNOSIS — F17210 Nicotine dependence, cigarettes, uncomplicated: Secondary | ICD-10-CM | POA: Diagnosis not present

## 2016-04-03 DIAGNOSIS — G8929 Other chronic pain: Secondary | ICD-10-CM | POA: Diagnosis not present

## 2016-04-03 MED ORDER — HYDROCODONE-ACETAMINOPHEN 7.5-325 MG PO TABS: 1.0000 | ORAL_TABLET | Freq: Four times a day (QID) | ORAL | 0 refills | Status: DC | PRN

## 2016-04-03 NOTE — Progress Notes (Signed)
[name], female DOB:10/19/1965, 50 y.o. [id number]  Chief Complaint  Patient presents with  . Follow-up    back pain    HPI  Kaylee Ochoa is a 50 y.o. female who has chronic lower back pain with no sciatica.  She has had more pain with the cooler weather. She is doing her exercises and taking her medicine. She has no weakness.  She is active. HPI  Body mass index is 23.96 kg/m.  ROS  Review of Systems  Constitutional:       Patient does not have Diabetes Mellitus. Patient has hypertension. Patient has COPD or shortness of breath. Patient does not have BMI > 35. Patient has current smoking history.  HENT: Negative for congestion.   Respiratory: Positive for cough and shortness of breath.   Cardiovascular: Negative for chest pain.  Endocrine: Positive for cold intolerance.  Musculoskeletal: Positive for arthralgias and back pain.  Allergic/Immunologic: Positive for environmental allergies.    Past Medical History:  Diagnosis Date  . Depression   . ETOH abuse   . Hypertension   . Renal disorder     Past Surgical History:  Procedure Laterality Date  . BREAST SURGERY    . cyst removal on breast    . DILITATION & CURRETTAGE/HYSTROSCOPY WITH NOVASURE ABLATION N/A 02/27/2016   Procedure: DILATATION & CURETTAGE/HYSTEROSCOPY  Procedure #2;  Surgeon: [name], MD;  Location: [facility];  Service: Gynecology;  Laterality: N/A;  . LAPAROSCOPIC BILATERAL SALPINGECTOMY N/A 02/27/2016   Procedure: LAPAROSCOPIC BILATERAL SALPINGECTOMY; LAPAROSCOPIC LYSIS OF ADHESIONS Procedure #1;  Surgeon: [name], MD;  Location: [facility];  Service: Gynecology;  Laterality: N/A;    Family History  Problem Relation Age of Onset  . Muscular dystrophy Mother   . Other Father     died in house fire  . Cancer Sister     esophageal  . Cancer Maternal Grandmother   . Heart attack Maternal Grandfather     Social History Social History  Substance Use Topics  .  Smoking status: Current Every Day Smoker    Packs/day: 0.25    Years: 20.00    Types: Cigarettes  . Smokeless tobacco: Never Used  . Alcohol use No     Comment: stopped July 2016    Allergies  Allergen Reactions  . Other Hives, Itching and Rash    UNKNOWN ANTIBIOTIC: Patient cannot recall name of the medication but it casued a rash all over the body, hives, and itching  . Sulfa Antibiotics Hives    Current Outpatient Prescriptions  Medication Sig Dispense Refill  . albuterol (PROVENTIL HFA;VENTOLIN HFA) 108 (90 BASE) MCG/ACT inhaler Inhale 2 puffs into the lungs every 6 (six) hours as needed for wheezing or shortness of breath. 1 Inhaler 0  . busPIRone (BUSPAR) 10 MG tablet Take 10 mg by mouth 3 (three) times daily.  2  . HYDROcodone-acetaminophen (NORCO) 7.5-325 MG tablet Take 1 tablet by mouth every 6 (six) hours as needed for moderate pain (Must last 30 days.Do not drive or operate machinery while taking this medicine.). 90 tablet 0  . ketorolac (TORADOL) 10 MG tablet Take 1 tablet (10 mg total) by mouth every 6 (six) hours as needed. (Patient not taking: Reported on 03/04/2016) 20 tablet 0  . lisinopril (PRINIVIL,ZESTRIL) 40 MG tablet Take 40 mg by mouth daily.  6  . loratadine (CLARITIN) 10 MG tablet Take 10 mg by mouth daily.    . mirtazapine (REMERON) 15 MG tablet Take  15 mg by mouth at bedtime.  2  . Multiple Vitamin (MULTIVITAMIN WITH MINERALS) TABS tablet Take 1 tablet by mouth daily.    . nicotine (NICODERM CQ - DOSED IN MG/24 HOURS) 21 mg/24hr patch Place 1 patch (21 mg total) onto the skin daily. 28 patch 0  . norethindrone (MICRONOR,CAMILA,ERRIN) 0.35 MG tablet Take 1 tablet by mouth daily.    . Omega-3 Fatty Acids (FISH OIL PO) Take 1 capsule by mouth daily.    Marland Kitchen oxyCODONE-acetaminophen (PERCOCET/ROXICET) 5-325 MG tablet Take 1-2 tablets by mouth every 4 (four) hours as needed for moderate pain or severe pain. (Patient not taking: Reported on 03/04/2016) 20 tablet 0  .  promethazine (PHENERGAN) 25 MG tablet Take 1 tablet (25 mg total) by mouth every 6 (six) hours as needed for nausea or vomiting. 30 tablet 1   No current facility-administered medications for this visit.      Physical Exam  Blood pressure 119/69, pulse 81, temperature 97.3 F (36.3 C), height 5\' 7"  (1.702 m), weight 153 lb (69.4 kg).  Constitutional: overall normal hygiene, normal nutrition, well developed, normal grooming, normal body habitus. Assistive device:none  Musculoskeletal: gait and station Limp none, muscle tone and strength are normal, no tremors or atrophy is present.  .  Neurological: coordination overall normal.  Deep tendon reflex/nerve stretch intact.  Sensation normal.  Cranial nerves II-XII intact.   Skin:   Normal overall no scars, lesions, ulcers or rashes. No psoriasis.  Psychiatric: Alert and oriented x 3.  Recent memory intact, remote memory unclear.  Normal mood and affect. Well groomed.  Good eye contact.  Cardiovascular: overall no swelling, no varicosities, no edema bilaterally, normal temperatures of the legs and arms, no clubbing, cyanosis and good capillary refill.  Lymphatic: palpation is normal.  Spine/Pelvis examination:  Inspection:  Overall, sacoiliac joint benign and hips nontender; without crepitus or defects.   Thoracic spine inspection: Alignment normal without kyphosis present   Lumbar spine inspection:  Alignment  with normal lumbar lordosis, without scoliosis apparent.   Thoracic spine palpation:  without tenderness of spinal processes   Lumbar spine palpation: with tenderness of lumbar area; without tightness of lumbar muscles    Range of Motion:   Lumbar flexion, forward flexion is 456 without pain or tenderness    Lumbar extension is 10 without pain or tenderness   Left lateral bend is Normal  without pain or tenderness   Right lateral bend is Normal without pain or tenderness   Straight leg raising is Normal   Strength & tone:  Normal   Stability overall normal stability     The patient has been educated about the nature of the problem(s) and counseled on treatment options.  The patient appeared to understand what I have discussed and is in agreement with it.  Encounter Diagnoses  Name Primary?  . Chronic midline low back pain without sciatica Yes  . Cigarette nicotine dependence without complication     PLAN Call if any problems.  Precautions discussed.  Continue current medications.   Return to clinic 3 months   Electronically Signed Sanjuana Kava, MD 12/20/20178:53 AM

## 2016-04-11 ENCOUNTER — Ambulatory Visit: Payer: Self-pay | Admitting: Orthopaedic Surgery

## 2016-05-08 ENCOUNTER — Telehealth: Payer: Self-pay | Admitting: Orthopaedic Surgery

## 2016-05-08 MED ORDER — HYDROCODONE-ACETAMINOPHEN 7.5-325 MG PO TABS: 1.0000 | ORAL_TABLET | Freq: Four times a day (QID) | ORAL | 0 refills | Status: DC | PRN

## 2016-06-05 ENCOUNTER — Telehealth: Payer: Self-pay | Admitting: Orthopaedic Surgery

## 2016-06-06 MED ORDER — HYDROCODONE-ACETAMINOPHEN 7.5-325 MG PO TABS: 1.0000 | ORAL_TABLET | Freq: Four times a day (QID) | ORAL | 0 refills | Status: DC | PRN

## 2016-07-02 ENCOUNTER — Ambulatory Visit (INDEPENDENT_AMBULATORY_CARE_PROVIDER_SITE_OTHER): Payer: No Typology Code available for payment source | Admitting: Orthopaedic Surgery

## 2016-07-02 VITALS — BP 130/86 | HR 89 | Temp 98.1°F | Ht 67.0 in | Wt 160.0 lb

## 2016-07-02 DIAGNOSIS — M545 Low back pain, unspecified: Secondary | ICD-10-CM

## 2016-07-02 DIAGNOSIS — G8929 Other chronic pain: Secondary | ICD-10-CM

## 2016-07-02 DIAGNOSIS — F17210 Nicotine dependence, cigarettes, uncomplicated: Secondary | ICD-10-CM

## 2016-07-02 MED ORDER — HYDROCODONE-ACETAMINOPHEN 7.5-325 MG PO TABS: 1.0000 | ORAL_TABLET | Freq: Four times a day (QID) | ORAL | 0 refills | Status: DC | PRN

## 2016-07-02 NOTE — Progress Notes (Signed)
Kaylee Ochoa, female DOB:[birth year], 51 y.o. EML:[id number]  Chief Complaint  Kaylee presents with  . Follow-up    Chronic low back pain     HPI  Kaylee Ochoa is a 51 y.o. female who has chronic pain of the lower back with no sciatica.  She is active and doing her exercises.  She has no new trauma.  I have encouraged [facility] to [facility] or go to the [facility]. HPI  Body mass index is 25.06 kg/m.  ROS  Review of Systems  Constitutional:       Kaylee does not have Diabetes Mellitus. Kaylee has hypertension. Kaylee has COPD or shortness of breath. Kaylee does not have BMI > 35. Kaylee has current smoking history.  HENT: Negative for congestion.   Respiratory: Positive for cough and shortness of breath.   Cardiovascular: Negative for chest pain.  Endocrine: Positive for cold intolerance.  Musculoskeletal: Positive for arthralgias and back pain.  Allergic/Immunologic: Positive for environmental allergies.    Past Medical History:  Diagnosis Date  . Depression   . ETOH abuse   . Hypertension   . Renal disorder     Past Surgical History:  Procedure Laterality Date  . BREAST SURGERY    . cyst removal on breast    . DILITATION & CURRETTAGE/HYSTROSCOPY WITH NOVASURE ABLATION N/A 02/27/2016   Procedure: DILATATION & CURETTAGE/HYSTEROSCOPY  Procedure #2;  Surgeon: [name], MD;  Location: [facility];  Service: Gynecology;  Laterality: N/A;  . LAPAROSCOPIC BILATERAL SALPINGECTOMY N/A 02/27/2016   Procedure: LAPAROSCOPIC BILATERAL SALPINGECTOMY; LAPAROSCOPIC LYSIS OF ADHESIONS Procedure #1;  Surgeon: [name], MD;  Location: [facility];  Service: Gynecology;  Laterality: N/A;    Family History  Problem Relation Age of Onset  . Muscular dystrophy Mother   . Other Father     died in house fire  . Cancer Sister     esophageal  . Cancer Maternal Grandmother   . Heart attack Maternal Grandfather     Social History Social History  Substance  Use Topics  . Smoking status: Current Every Day Smoker    Packs/day: 0.25    Years: 20.00    Types: Cigarettes  . Smokeless tobacco: Never Used  . Alcohol use No     Comment: stopped July 2016    Allergies  Allergen Reactions  . Other Hives, Itching and Rash    UNKNOWN ANTIBIOTIC: Kaylee cannot recall name of the medication but it casued a rash all over the body, hives, and itching  . Sulfa Antibiotics Hives    Current Outpatient Prescriptions  Medication Sig Dispense Refill  . albuterol (PROVENTIL HFA;VENTOLIN HFA) 108 (90 BASE) MCG/ACT inhaler Inhale 2 puffs into the lungs every 6 (six) hours as needed for wheezing or shortness of breath. 1 Inhaler 0  . busPIRone (BUSPAR) 10 MG tablet Take 10 mg by mouth 3 (three) times daily.  2  . HYDROcodone-acetaminophen (NORCO) 7.5-325 MG tablet Take 1 tablet by mouth every 6 (six) hours as needed for moderate pain (Must last 30 days.Do not drive or operate machinery while taking this medicine.). 75 tablet 0  . lisinopril (PRINIVIL,ZESTRIL) 40 MG tablet Take 40 mg by mouth daily.  6  . loratadine (CLARITIN) 10 MG tablet Take 10 mg by mouth daily.    . mirtazapine (REMERON) 15 MG tablet Take 15 mg by mouth at bedtime.  2  . Multiple Vitamin (MULTIVITAMIN WITH MINERALS) TABS tablet Take 1 tablet by mouth daily.    Marland Kitchen  nicotine (NICODERM CQ - DOSED IN MG/24 HOURS) 21 mg/24hr patch Place 1 patch (21 mg total) onto the skin daily. 28 patch 0  . norethindrone (MICRONOR,CAMILA,ERRIN) 0.35 MG tablet Take 1 tablet by mouth daily.    . Omega-3 Fatty Acids (FISH OIL PO) Take 1 capsule by mouth daily.    . promethazine (PHENERGAN) 25 MG tablet Take 1 tablet (25 mg total) by mouth every 6 (six) hours as needed for nausea or vomiting. 30 tablet 1   No current facility-administered medications for this visit.      Physical Exam  Blood pressure 130/86, pulse 89, temperature 98.1 F (36.7 C), height 5\' 7"  (1.702 m), weight 160 lb (72.6  kg).  Constitutional: overall normal hygiene, normal nutrition, well developed, normal grooming, normal body habitus. Assistive device:none  Musculoskeletal: gait and station Limp none, muscle tone and strength are normal, no tremors or atrophy is present.  .  Neurological: coordination overall normal.  Deep tendon reflex/nerve stretch intact.  Sensation normal.  Cranial nerves II-XII intact.   Skin:   Normal overall no scars, lesions, ulcers or rashes. No psoriasis.  Psychiatric: Alert and oriented x 3.  Recent memory intact, remote memory unclear.  Normal mood and affect. Well groomed.  Good eye contact.  Cardiovascular: overall no swelling, no varicosities, no edema bilaterally, normal temperatures of the legs and arms, no clubbing, cyanosis and good capillary refill.  Lymphatic: palpation is normal.  Spine/Pelvis examination:  Inspection:  Overall, sacoiliac joint benign and hips nontender; without crepitus or defects.   Thoracic spine inspection: Alignment normal without kyphosis present   Lumbar spine inspection:  Alignment  with normal lumbar lordosis, without scoliosis apparent.   Thoracic spine palpation:  without tenderness of spinal processes   Lumbar spine palpation: with tenderness of lumbar area; without tightness of lumbar muscles    Range of Motion:   Lumbar flexion, forward flexion is 50 without pain or tenderness    Lumbar extension is 10 without pain or tenderness   Left lateral bend is Normal  without pain or tenderness   Right lateral bend is Normal without pain or tenderness   Straight leg raising is Normal   Strength & tone: Normal   Stability overall normal stability     The Kaylee has been educated about the nature of the problem(s) and counseled on treatment options.  The Kaylee appeared to understand what I have discussed and is in agreement with it.  Encounter Diagnoses  Name Primary?  . Chronic midline low back pain without sciatica Yes  .  Cigarette nicotine dependence without complication     PLAN Call if any problems.  Precautions discussed.  Continue current medications.   Return to clinic 3 months   I have reviewed the [place] web site prior to prescribing narcotic medicine for this Kaylee.  Electronically Signed Sanjuana Kava, MD 3/20/20182:58 PM

## 2016-08-06 ENCOUNTER — Telehealth: Payer: Self-pay | Admitting: Orthopaedic Surgery

## 2016-08-07 MED ORDER — HYDROCODONE-ACETAMINOPHEN 7.5-325 MG PO TABS: 1.0000 | ORAL_TABLET | Freq: Four times a day (QID) | ORAL | 0 refills | Status: DC | PRN

## 2016-09-04 ENCOUNTER — Telehealth: Payer: Self-pay | Admitting: Orthopaedic Surgery

## 2016-09-04 MED ORDER — HYDROCODONE-ACETAMINOPHEN 7.5-325 MG PO TABS: 1.0000 | ORAL_TABLET | Freq: Four times a day (QID) | ORAL | 0 refills | Status: DC | PRN

## 2016-10-01 ENCOUNTER — Encounter: Payer: Self-pay | Admitting: Orthopaedic Surgery

## 2016-10-01 ENCOUNTER — Ambulatory Visit (INDEPENDENT_AMBULATORY_CARE_PROVIDER_SITE_OTHER): Payer: No Typology Code available for payment source | Admitting: Orthopaedic Surgery

## 2016-10-01 VITALS — BP 103/68 | HR 70 | Ht 67.0 in | Wt 158.0 lb

## 2016-10-01 DIAGNOSIS — M545 Low back pain, unspecified: Secondary | ICD-10-CM | POA: Insufficient documentation

## 2016-10-01 DIAGNOSIS — F17210 Nicotine dependence, cigarettes, uncomplicated: Secondary | ICD-10-CM

## 2016-10-01 DIAGNOSIS — G8929 Other chronic pain: Secondary | ICD-10-CM

## 2016-10-01 MED ORDER — HYDROCODONE-ACETAMINOPHEN 7.5-325 MG PO TABS: 1.0000 | ORAL_TABLET | Freq: Four times a day (QID) | ORAL | 0 refills | Status: DC | PRN

## 2016-10-01 NOTE — Patient Instructions (Signed)
Steps to Quit Smoking Smoking tobacco can be bad for your health. It can also affect almost every organ in your body. Smoking puts you and people around you at risk for many serious long-lasting (chronic) diseases. Quitting smoking is hard, but it is one of the best things that you can do for your health. It is never too late to quit. What are the benefits of quitting smoking? When you quit smoking, you lower your risk for getting serious diseases and conditions. They can include:  Lung cancer or lung disease.  Heart disease.  Stroke.  Heart attack.  Not being able to have children (infertility).  Weak bones (osteoporosis) and broken bones (fractures).  If you have coughing, wheezing, and shortness of breath, those symptoms may get better when you quit. You may also get sick less often. If you are pregnant, quitting smoking can help to lower your chances of having a baby of low birth weight. What can I do to help me quit smoking? Talk with your doctor about what can help you quit smoking. Some things you can do (strategies) include:  Quitting smoking totally, instead of slowly cutting back how much you smoke over a period of time.  Going to in-person counseling. You are more likely to quit if you go to many counseling sessions.  Using resources and support systems, such as: ? Online chats with a counselor. ? Phone quitlines. ? Printed self-help materials. ? Support groups or group counseling. ? Text messaging programs. ? Mobile phone apps or applications.  Taking medicines. Some of these medicines may have nicotine in them. If you are pregnant or breastfeeding, do not take any medicines to quit smoking unless your doctor says it is okay. Talk with your doctor about counseling or other things that can help you.  Talk with your doctor about using more than one strategy at the same time, such as taking medicines while you are also going to in-person counseling. This can help make  quitting easier. What things can I do to make it easier to quit? Quitting smoking might feel very hard at first, but there is a lot that you can do to make it easier. Take these steps:  Talk to your family and friends. Ask them to support and encourage you.  Call phone quitlines, reach out to support groups, or work with a counselor.  Ask people who smoke to not smoke around you.  Avoid places that make you want (trigger) to smoke, such as: ? Bars. ? Parties. ? Smoke-break areas at work.  Spend time with people who do not smoke.  Lower the stress in your life. Stress can make you want to smoke. Try these things to help your stress: ? Getting regular exercise. ? Deep-breathing exercises. ? Yoga. ? Meditating. ? Doing a body scan. To do this, close your eyes, focus on one area of your body at a time from head to toe, and notice which parts of your body are tense. Try to relax the muscles in those areas.  Download or buy apps on your mobile phone or tablet that can help you stick to your quit plan. There are many free apps, such as QuitGuide from the CDC (Centers for Disease Control and Prevention). You can find more support from smokefree.gov and other websites.  This information is not intended to replace advice given to you by your health care provider. Make sure you discuss any questions you have with your health care provider. Document Released: 01/26/2009 Document   Revised: 11/28/2015 Document Reviewed: 08/16/2014 Elsevier Interactive Patient Education  2018 Elsevier Inc.  

## 2016-10-01 NOTE — Progress Notes (Signed)
[name], female DOB:07/06/1965, 50 y.o. GNO:[id number]  Chief Complaint  Patient presents with  . Back Pain    lumbar    HPI  Kaylee Ochoa is a 51 y.o. female who has lower back pain.  She is walking and doing more activities.  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  She has no new trauma, no paresthesias.  She is taking her medicine. HPI  Body mass index is 24.75 kg/m.  ROS  Review of Systems  Constitutional:       Patient does not have Diabetes Mellitus. Patient has hypertension. Patient has COPD or shortness of breath. Patient does not have BMI > 35. Patient has current smoking history.  HENT: Negative for congestion.   Respiratory: Positive for cough and shortness of breath.   Cardiovascular: Negative for chest pain.  Endocrine: Positive for cold intolerance.  Musculoskeletal: Positive for arthralgias and back pain.  Allergic/Immunologic: Positive for environmental allergies.    Past Medical History:  Diagnosis Date  . Depression   . ETOH abuse   . Hypertension   . Renal disorder     Past Surgical History:  Procedure Laterality Date  . BREAST SURGERY    . cyst removal on breast    . DILITATION & CURRETTAGE/HYSTROSCOPY WITH NOVASURE ABLATION N/A 02/27/2016   Procedure: DILATATION & CURETTAGE/HYSTEROSCOPY  Procedure #2;  Surgeon: [name], MD;  Location: [facility];  Service: Gynecology;  Laterality: N/A;  . LAPAROSCOPIC BILATERAL SALPINGECTOMY N/A 02/27/2016   Procedure: LAPAROSCOPIC BILATERAL SALPINGECTOMY; LAPAROSCOPIC LYSIS OF ADHESIONS Procedure #1;  Surgeon: [name], MD;  Location: [facility];  Service: Gynecology;  Laterality: N/A;    Family History  Problem Relation Age of Onset  . Muscular dystrophy Mother   . Other Father        died in house fire  . Cancer Sister        esophageal  . Cancer Maternal Grandmother   . Heart attack Maternal Grandfather     Social History Social History  Substance Use  Topics  . Smoking status: Current Every Day Smoker    Packs/day: 0.25    Years: 20.00    Types: Cigarettes  . Smokeless tobacco: Never Used  . Alcohol use No     Comment: stopped July 2016    Allergies  Allergen Reactions  . Other Hives, Itching and Rash    UNKNOWN ANTIBIOTIC: Patient cannot recall name of the medication but it casued a rash all over the body, hives, and itching  . Sulfa Antibiotics Hives    Current Outpatient Prescriptions  Medication Sig Dispense Refill  . albuterol (PROVENTIL HFA;VENTOLIN HFA) 108 (90 BASE) MCG/ACT inhaler Inhale 2 puffs into the lungs every 6 (six) hours as needed for wheezing or shortness of breath. 1 Inhaler 0  . busPIRone (BUSPAR) 10 MG tablet Take 10 mg by mouth 3 (three) times daily.  2  . HYDROcodone-acetaminophen (NORCO) 7.5-325 MG tablet Take 1 tablet by mouth every 6 (six) hours as needed for moderate pain (Must last 30 days.Do not drive or operate machinery while taking this medicine.). 65 tablet 0  . lisinopril (PRINIVIL,ZESTRIL) 40 MG tablet Take 40 mg by mouth daily.  6  . loratadine (CLARITIN) 10 MG tablet Take 10 mg by mouth daily.    . mirtazapine (REMERON) 15 MG tablet Take 15 mg by mouth at bedtime.  2  . Multiple Vitamin (MULTIVITAMIN WITH MINERALS) TABS tablet Take 1 tablet by  mouth daily.    . nicotine (NICODERM CQ - DOSED IN MG/24 HOURS) 21 mg/24hr patch Place 1 patch (21 mg total) onto the skin daily. 28 patch 0  . norethindrone (MICRONOR,CAMILA,ERRIN) 0.35 MG tablet Take 1 tablet by mouth daily.    . Omega-3 Fatty Acids (FISH OIL PO) Take 1 capsule by mouth daily.    . promethazine (PHENERGAN) 25 MG tablet Take 1 tablet (25 mg total) by mouth every 6 (six) hours as needed for nausea or vomiting. 30 tablet 1   No current facility-administered medications for this visit.      Physical Exam  Blood pressure 103/68, pulse 70, height 5\' 7"  (1.702 m), weight 158 lb (71.7 kg).  Constitutional: overall normal hygiene,  normal nutrition, well developed, normal grooming, normal body habitus. Assistive device:none  Musculoskeletal: gait and station Limp none, muscle tone and strength are normal, no tremors or atrophy is present.  .  Neurological: coordination overall normal.  Deep tendon reflex/nerve stretch intact.  Sensation normal.  Cranial nerves II-XII intact.   Skin:   Normal overall no scars, lesions, ulcers or rashes. No psoriasis.  Psychiatric: Alert and oriented x 3.  Recent memory intact, remote memory unclear.  Normal mood and affect. Well groomed.  Good eye contact.  Cardiovascular: overall no swelling, no varicosities, no edema bilaterally, normal temperatures of the legs and arms, no clubbing, cyanosis and good capillary refill.  Lymphatic: palpation is normal.  Spine/Pelvis examination:  Inspection:  Overall, sacoiliac joint benign and hips nontender; without crepitus or defects.   Thoracic spine inspection: Alignment normal without kyphosis present   Lumbar spine inspection:  Alignment  with normal lumbar lordosis, without scoliosis apparent.   Thoracic spine palpation:  without tenderness of spinal processes   Lumbar spine palpation: with tenderness of lumbar area; without tightness of lumbar muscles    Range of Motion:   Lumbar flexion, forward flexion is 45 without pain or tenderness    Lumbar extension is 10 without pain or tenderness   Left lateral bend is Normal  without pain or tenderness   Right lateral bend is Normal without pain or tenderness   Straight leg raising is Normal   Strength & tone: Normal   Stability overall normal stability     The patient has been educated about the nature of the problem(s) and counseled on treatment options.  The patient appeared to understand what I have discussed and is in agreement with it.  Encounter Diagnoses  Name Primary?  . Chronic midline low back pain without sciatica Yes  . Cigarette nicotine dependence without  complication     She continues to smoke but is cutting back.  PLAN Call if any problems.  Precautions discussed.  Continue current medications.   Return to clinic 3 months   I have reviewed the [place] web site prior to prescribing narcotic medicine for this patient.  Electronically Signed Sanjuana Kava, MD 6/19/20188:20 AM

## 2016-11-03 ENCOUNTER — Telehealth: Payer: Self-pay | Admitting: Orthopaedic Surgery

## 2016-11-04 MED ORDER — HYDROCODONE-ACETAMINOPHEN 7.5-325 MG PO TABS: 1.0000 | ORAL_TABLET | Freq: Four times a day (QID) | ORAL | 0 refills | Status: DC | PRN

## 2016-12-01 ENCOUNTER — Telehealth: Payer: Self-pay | Admitting: Orthopaedic Surgery

## 2016-12-03 MED ORDER — HYDROCODONE-ACETAMINOPHEN 7.5-325 MG PO TABS: 1.0000 | ORAL_TABLET | Freq: Four times a day (QID) | ORAL | 0 refills | Status: DC | PRN

## 2017-01-01 ENCOUNTER — Encounter: Payer: Self-pay | Admitting: Orthopaedic Surgery

## 2017-01-01 ENCOUNTER — Ambulatory Visit (INDEPENDENT_AMBULATORY_CARE_PROVIDER_SITE_OTHER): Payer: Self-pay | Admitting: Orthopaedic Surgery

## 2017-01-01 VITALS — BP 104/71 | HR 78 | Resp 16 | Ht 67.0 in | Wt 158.0 lb

## 2017-01-01 DIAGNOSIS — G8929 Other chronic pain: Secondary | ICD-10-CM

## 2017-01-01 DIAGNOSIS — M545 Low back pain: Secondary | ICD-10-CM

## 2017-01-01 DIAGNOSIS — F17210 Nicotine dependence, cigarettes, uncomplicated: Secondary | ICD-10-CM

## 2017-01-01 MED ORDER — HYDROCODONE-ACETAMINOPHEN 7.5-325 MG PO TABS: 1.0000 | ORAL_TABLET | Freq: Four times a day (QID) | ORAL | 0 refills | Status: DC | PRN

## 2017-01-01 NOTE — Progress Notes (Signed)
[name], female DOB:01/09/1966, 51 y.o. YFV:[id number]  Chief Complaint  Patient presents with  . Back Pain    c/o low back pain, no changes wants refill of Norco     HPI  Kaylee Ochoa is a 51 y.o. female who has lower back pain chronically.  She has no new trauma, no paresthesias.  She is doing her exercises.  She has no untoward events. HPI  Body mass index is 24.75 kg/m.  ROS  Review of Systems  Constitutional:       Patient does not have Diabetes Mellitus. Patient has hypertension. Patient has COPD or shortness of breath. Patient does not have BMI > 35. Patient has current smoking history.  HENT: Negative for congestion.   Respiratory: Positive for cough and shortness of breath.   Cardiovascular: Negative for chest pain.  Endocrine: Positive for cold intolerance.  Musculoskeletal: Positive for arthralgias and back pain.  Allergic/Immunologic: Positive for environmental allergies.    Past Medical History:  Diagnosis Date  . Depression   . ETOH abuse   . Hypertension   . Renal disorder     Past Surgical History:  Procedure Laterality Date  . BREAST SURGERY    . cyst removal on breast    . DILITATION & CURRETTAGE/HYSTROSCOPY WITH NOVASURE ABLATION N/A 02/27/2016   Procedure: DILATATION & CURETTAGE/HYSTEROSCOPY  Procedure #2;  Surgeon: [name], MD;  Location: [facility];  Service: Gynecology;  Laterality: N/A;  . LAPAROSCOPIC BILATERAL SALPINGECTOMY N/A 02/27/2016   Procedure: LAPAROSCOPIC BILATERAL SALPINGECTOMY; LAPAROSCOPIC LYSIS OF ADHESIONS Procedure #1;  Surgeon: [name], MD;  Location: [facility];  Service: Gynecology;  Laterality: N/A;    Family History  Problem Relation Age of Onset  . Muscular dystrophy Mother   . Other Father        died in house fire  . Cancer Sister        esophageal  . Cancer Maternal Grandmother   . Heart attack Maternal Grandfather     Social History Social History  Substance Use Topics  .  Smoking status: Current Every Day Smoker    Packs/day: 0.25    Years: 20.00    Types: Cigarettes  . Smokeless tobacco: Never Used  . Alcohol use No     Comment: stopped July 2016    Allergies  Allergen Reactions  . Other Hives, Itching and Rash    UNKNOWN ANTIBIOTIC: Patient cannot recall name of the medication but it casued a rash all over the body, hives, and itching  . Sulfa Antibiotics Hives    Current Outpatient Prescriptions  Medication Sig Dispense Refill  . albuterol (PROVENTIL HFA;VENTOLIN HFA) 108 (90 BASE) MCG/ACT inhaler Inhale 2 puffs into the lungs every 6 (six) hours as needed for wheezing or shortness of breath. 1 Inhaler 0  . busPIRone (BUSPAR) 10 MG tablet Take 10 mg by mouth 3 (three) times daily.  2  . HYDROcodone-acetaminophen (NORCO) 7.5-325 MG tablet Take 1 tablet by mouth every 6 (six) hours as needed for moderate pain (Must last 30 days.Do not drive or operate machinery while taking this medicine.). 60 tablet 0  . lisinopril (PRINIVIL,ZESTRIL) 40 MG tablet Take 40 mg by mouth daily.  6  . loratadine (CLARITIN) 10 MG tablet Take 10 mg by mouth daily.    . Multiple Vitamin (MULTIVITAMIN WITH MINERALS) TABS tablet Take 1 tablet by mouth daily.    . nicotine (NICODERM CQ - DOSED IN MG/24 HOURS) 21 mg/24hr patch Place 1  patch (21 mg total) onto the skin daily. 28 patch 0  . norethindrone (MICRONOR,CAMILA,ERRIN) 0.35 MG tablet Take 1 tablet by mouth daily.    . Omega-3 Fatty Acids (FISH OIL PO) Take 1 capsule by mouth daily.    . promethazine (PHENERGAN) 25 MG tablet Take 1 tablet (25 mg total) by mouth every 6 (six) hours as needed for nausea or vomiting. 30 tablet 1  . mirtazapine (REMERON) 15 MG tablet Take 15 mg by mouth at bedtime.  2   No current facility-administered medications for this visit.      Physical Exam  Blood pressure 104/71, pulse 78, resp. rate 16, height 5\' 7"  (1.702 m), weight 158 lb (71.7 kg).  Constitutional: overall normal hygiene,  normal nutrition, well developed, normal grooming, normal body habitus. Assistive device:none  Musculoskeletal: gait and station Limp none, muscle tone and strength are normal, no tremors or atrophy is present.  .  Neurological: coordination overall normal.  Deep tendon reflex/nerve stretch intact.  Sensation normal.  Cranial nerves II-XII intact.   Skin:   Normal overall no scars, lesions, ulcers or rashes. No psoriasis.  Psychiatric: Alert and oriented x 3.  Recent memory intact, remote memory unclear.  Normal mood and affect. Well groomed.  Good eye contact.  Cardiovascular: overall no swelling, no varicosities, no edema bilaterally, normal temperatures of the legs and arms, no clubbing, cyanosis and good capillary refill.  Lymphatic: palpation is normal.  All other systems reviewed and are negative   Spine/Pelvis examination:  Inspection:  Overall, sacoiliac joint benign and hips nontender; without crepitus or defects.   Thoracic spine inspection: Alignment normal without kyphosis present   Lumbar spine inspection:  Alignment  with normal lumbar lordosis, without scoliosis apparent.   Thoracic spine palpation:  without tenderness of spinal processes   Lumbar spine palpation: without tenderness of lumbar area; without tightness of lumbar muscles    Range of Motion:   Lumbar flexion, forward flexion is normal without pain or tenderness    Lumbar extension is full without pain or tenderness   Left lateral bend is normal without pain or tenderness   Right lateral bend is normal without pain or tenderness   Straight leg raising is normal  Strength & tone: normal   Stability overall normal stability The patient has been educated about the nature of the problem(s) and counseled on treatment options.  The patient appeared to understand what I have discussed and is in agreement with it.  Encounter Diagnoses  Name Primary?  . Chronic midline low back pain without sciatica Yes  .  Cigarette nicotine dependence without complication     PLAN Call if any problems.  Precautions discussed.  Continue current medications.   Return to clinic 3 months   I have reviewed the [facility] web site prior to prescribing narcotic medicine for this patient.  Electronically Signed Sanjuana Kava, MD 9/19/20189:04 AM

## 2017-01-30 ENCOUNTER — Telehealth: Payer: Self-pay | Admitting: Orthopaedic Surgery

## 2017-01-30 MED ORDER — HYDROCODONE-ACETAMINOPHEN 7.5-325 MG PO TABS: 1.0000 | ORAL_TABLET | Freq: Four times a day (QID) | ORAL | 0 refills | Status: DC | PRN

## 2017-03-02 ENCOUNTER — Other Ambulatory Visit: Payer: Self-pay | Admitting: Orthopaedic Surgery

## 2017-03-05 MED ORDER — HYDROCODONE-ACETAMINOPHEN 7.5-325 MG PO TABS: 1.0000 | ORAL_TABLET | Freq: Four times a day (QID) | ORAL | 0 refills | Status: DC | PRN

## 2017-04-03 ENCOUNTER — Ambulatory Visit: Payer: Self-pay | Admitting: Orthopaedic Surgery

## 2017-04-03 ENCOUNTER — Encounter: Payer: Self-pay | Admitting: Orthopaedic Surgery

## 2017-04-03 VITALS — BP 106/61 | HR 78 | Temp 98.8°F | Ht 68.0 in | Wt 160.0 lb

## 2017-04-03 DIAGNOSIS — M545 Low back pain, unspecified: Secondary | ICD-10-CM

## 2017-04-03 DIAGNOSIS — G8929 Other chronic pain: Secondary | ICD-10-CM

## 2017-04-03 DIAGNOSIS — F17210 Nicotine dependence, cigarettes, uncomplicated: Secondary | ICD-10-CM

## 2017-04-03 MED ORDER — HYDROCODONE-ACETAMINOPHEN 7.5-325 MG PO TABS: 1.0000 | ORAL_TABLET | Freq: Four times a day (QID) | ORAL | 0 refills | Status: DC | PRN

## 2017-04-03 NOTE — Patient Instructions (Signed)
Steps to Quit Smoking Smoking tobacco can be bad for your health. It can also affect almost every organ in your body. Smoking puts you and people around you at risk for many serious long-lasting (chronic) diseases. Quitting smoking is hard, but it is one of the best things that you can do for your health. It is never too late to quit. What are the benefits of quitting smoking? When you quit smoking, you lower your risk for getting serious diseases and conditions. They can include:  Lung cancer or lung disease.  Heart disease.  Stroke.  Heart attack.  Not being able to have children (infertility).  Weak bones (osteoporosis) and broken bones (fractures).  If you have coughing, wheezing, and shortness of breath, those symptoms may get better when you quit. You may also get sick less often. If you are pregnant, quitting smoking can help to lower your chances of having a baby of low birth weight. What can I do to help me quit smoking? Talk with your doctor about what can help you quit smoking. Some things you can do (strategies) include:  Quitting smoking totally, instead of slowly cutting back how much you smoke over a period of time.  Going to in-person counseling. You are more likely to quit if you go to many counseling sessions.  Using resources and support systems, such as: ? Online chats with a counselor. ? Phone quitlines. ? Printed self-help materials. ? Support groups or group counseling. ? Text messaging programs. ? Mobile phone apps or applications.  Taking medicines. Some of these medicines may have nicotine in them. If you are pregnant or breastfeeding, do not take any medicines to quit smoking unless your doctor says it is okay. Talk with your doctor about counseling or other things that can help you.  Talk with your doctor about using more than one strategy at the same time, such as taking medicines while you are also going to in-person counseling. This can help make  quitting easier. What things can I do to make it easier to quit? Quitting smoking might feel very hard at first, but there is a lot that you can do to make it easier. Take these steps:  Talk to your family and friends. Ask them to support and encourage you.  Call phone quitlines, reach out to support groups, or work with a counselor.  Ask people who smoke to not smoke around you.  Avoid places that make you want (trigger) to smoke, such as: ? Bars. ? Parties. ? Smoke-break areas at work.  Spend time with people who do not smoke.  Lower the stress in your life. Stress can make you want to smoke. Try these things to help your stress: ? Getting regular exercise. ? Deep-breathing exercises. ? Yoga. ? Meditating. ? Doing a body scan. To do this, close your eyes, focus on one area of your body at a time from head to toe, and notice which parts of your body are tense. Try to relax the muscles in those areas.  Download or buy apps on your mobile phone or tablet that can help you stick to your quit plan. There are many free apps, such as QuitGuide from the CDC (Centers for Disease Control and Prevention). You can find more support from smokefree.gov and other websites.  This information is not intended to replace advice given to you by your health care provider. Make sure you discuss any questions you have with your health care provider. Document Released: 01/26/2009 Document   Revised: 11/28/2015 Document Reviewed: 08/16/2014 Elsevier Interactive Patient Education  2018 Elsevier Inc.  

## 2017-04-03 NOTE — Progress Notes (Signed)
[name], female DOB:1965/10/08, 51 y.o. Kaylee Ochoa  Chief Complaint  Patient presents with  . Back Pain    lumbar     HPI  Kaylee Ochoa is a 51 y.o. female who has chronic lower back pain.  The cold weather has made her worse.  She has no new trauma. She is taking her medicine and doing her exercises. HPI  Body mass index is 24.33 kg/m.  ROS  Review of Systems  Constitutional:       Patient does not have Diabetes Mellitus. Patient has hypertension. Patient has COPD or shortness of breath. Patient does not have BMI > 35. Patient has current smoking history.  HENT: Negative for congestion.   Respiratory: Positive for cough and shortness of breath.   Cardiovascular: Negative for chest pain.  Endocrine: Positive for cold intolerance.  Musculoskeletal: Positive for arthralgias and back pain.  Allergic/Immunologic: Positive for environmental allergies.  All other systems reviewed and are negative.   Past Medical History:  Diagnosis Date  . Depression   . ETOH abuse   . Hypertension   . Renal disorder     Past Surgical History:  Procedure Laterality Date  . BREAST SURGERY    . cyst removal on breast    . DILITATION & CURRETTAGE/HYSTROSCOPY WITH NOVASURE ABLATION N/A 02/27/2016   Procedure: DILATATION & CURETTAGE/HYSTEROSCOPY  Procedure #2;  Surgeon: [name], MD;  Location: [facility];  Service: Gynecology;  Laterality: N/A;  . LAPAROSCOPIC BILATERAL SALPINGECTOMY N/A 02/27/2016   Procedure: LAPAROSCOPIC BILATERAL SALPINGECTOMY; LAPAROSCOPIC LYSIS OF ADHESIONS Procedure #1;  Surgeon: [name], MD;  Location: [facility];  Service: Gynecology;  Laterality: N/A;    Family History  Problem Relation Age of Onset  . Muscular dystrophy Mother   . Other Father        died in house fire  . Cancer Sister        esophageal  . Cancer Maternal Grandmother   . Heart attack Maternal Grandfather     Social History Social History   Tobacco Use   . Smoking status: Current Every Day Smoker    Packs/day: 0.25    Years: 20.00    Pack years: 5.00    Types: Cigarettes  . Smokeless tobacco: Never Used  Substance Use Topics  . Alcohol use: No    Comment: stopped July 2016  . Drug use: No    Allergies  Allergen Reactions  . Other Hives, Itching and Rash    UNKNOWN ANTIBIOTIC: Patient cannot recall name of the medication but it casued a rash all over the body, hives, and itching  . Sulfa Antibiotics Hives    Current Outpatient Medications  Medication Sig Dispense Refill  . albuterol (PROVENTIL HFA;VENTOLIN HFA) 108 (90 BASE) MCG/ACT inhaler Inhale 2 puffs into the lungs every 6 (six) hours as needed for wheezing or shortness of breath. 1 Inhaler 0  . busPIRone (BUSPAR) 10 MG tablet Take 10 mg by mouth 3 (three) times daily.  2  . HYDROcodone-acetaminophen (NORCO) 7.5-325 MG tablet Take 1 tablet by mouth every 6 (six) hours as needed for moderate pain (Must last 30 days.Do not drive or operate machinery while taking this medicine.). 60 tablet 0  . lisinopril (PRINIVIL,ZESTRIL) 40 MG tablet Take 40 mg by mouth daily.  6  . loratadine (CLARITIN) 10 MG tablet Take 10 mg by mouth daily.    . mirtazapine (REMERON) 15 MG tablet Take 15 mg by mouth at bedtime.  2  .  Multiple Vitamin (MULTIVITAMIN WITH MINERALS) TABS tablet Take 1 tablet by mouth daily.    . norethindrone (MICRONOR,CAMILA,ERRIN) 0.35 MG tablet Take 1 tablet by mouth daily.    . Omega-3 Fatty Acids (FISH OIL PO) Take 1 capsule by mouth daily.    . promethazine (PHENERGAN) 25 MG tablet Take 1 tablet (25 mg total) by mouth every 6 (six) hours as needed for nausea or vomiting. 30 tablet 1  . nicotine (NICODERM CQ - DOSED IN MG/24 HOURS) 21 mg/24hr patch Place 1 patch (21 mg total) onto the skin daily. (Patient not taking: Reported on 04/03/2017) 28 patch 0   No current facility-administered medications for this visit.      Physical Exam  Blood pressure 106/61, pulse 78,  temperature 98.8 F (37.1 C), height 5\' 8"  (1.727 m), weight 160 lb (72.6 kg).  Constitutional: overall normal hygiene, normal nutrition, well developed, normal grooming, normal body habitus. Assistive device:none  Musculoskeletal: gait and station Limp none, muscle tone and strength are normal, no tremors or atrophy is present.  .  Neurological: coordination overall normal.  Deep tendon reflex/nerve stretch intact.  Sensation normal.  Cranial nerves II-XII intact.   Skin:   Normal overall no scars, lesions, ulcers or rashes. No psoriasis.  Psychiatric: Alert and oriented x 3.  Recent memory intact, remote memory unclear.  Normal mood and affect. Well groomed.  Good eye contact.  Cardiovascular: overall no swelling, no varicosities, no edema bilaterally, normal temperatures of the legs and arms, no clubbing, cyanosis and good capillary refill.  Lymphatic: palpation is normal.  All other systems reviewed and are negative   Spine/Pelvis examination:  Inspection:  Overall, sacoiliac joint benign and hips nontender; without crepitus or defects.   Thoracic spine inspection: Alignment normal without kyphosis present   Lumbar spine inspection:  Alignment  with normal lumbar lordosis, without scoliosis apparent.   Thoracic spine palpation:  without tenderness of spinal processes   Lumbar spine palpation: without tenderness of lumbar area; without tightness of lumbar muscles    Range of Motion:   Lumbar flexion, forward flexion is normal without pain or tenderness    Lumbar extension is full without pain or tenderness   Left lateral bend is normal without pain or tenderness   Right lateral bend is normal without pain or tenderness   Straight leg raising is normal  Strength & tone: normal   Stability overall normal stability The patient has been educated about the nature of the problem(s) and counseled on treatment options.  The patient appeared to understand what I have discussed and  is in agreement with it.  Encounter Diagnoses  Name Primary?  . Chronic midline low back pain without sciatica Yes  . Cigarette nicotine dependence without complication    She has cut back on her smoking and continues to do so.  PLAN Call if any problems.  Precautions discussed.  Continue current medications.   Return to clinic 2 months   I have reviewed the [facility] web site prior to prescribing narcotic medicine for this patient.  Electronically Signed Sanjuana Kava, MD 12/20/20188:22 AM

## 2017-04-30 ENCOUNTER — Other Ambulatory Visit: Payer: Self-pay | Admitting: Orthopaedic Surgery

## 2017-05-01 MED ORDER — HYDROCODONE-ACETAMINOPHEN 7.5-325 MG PO TABS: 1.0000 | ORAL_TABLET | Freq: Four times a day (QID) | ORAL | 0 refills | Status: DC | PRN

## 2017-05-22 IMAGING — US US ABDOMEN LIMITED
1 series · 13 of 25 positions shown · non-contrast
Comparison: None.

CLINICAL DATA: Elevated liver enzymes.  Ethanol abuse

EXAM:
US ABDOMEN LIMITED - RIGHT UPPER QUADRANT

[Series 1: us abdomen limited · 0.12mm/px · 13 of 54 slices shown]
[im 1/54]
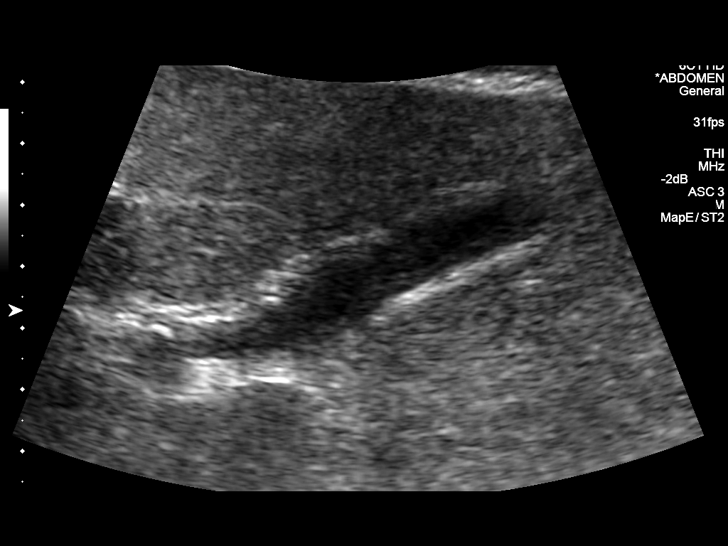
[im 5/54]
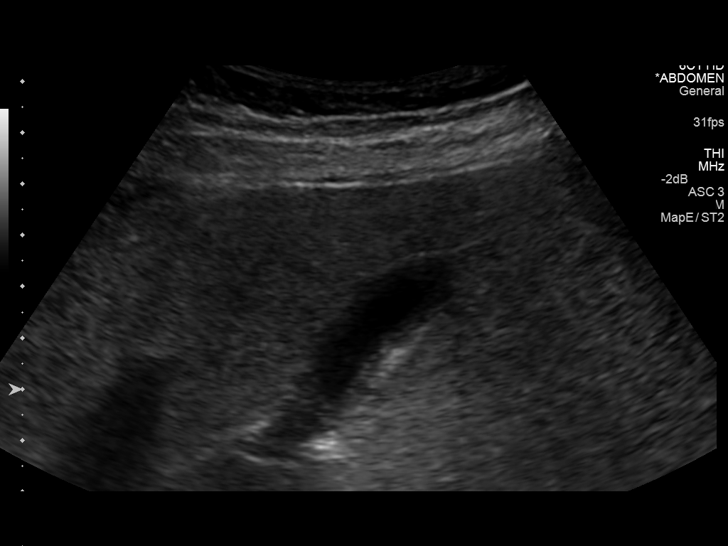
[im 9/54]
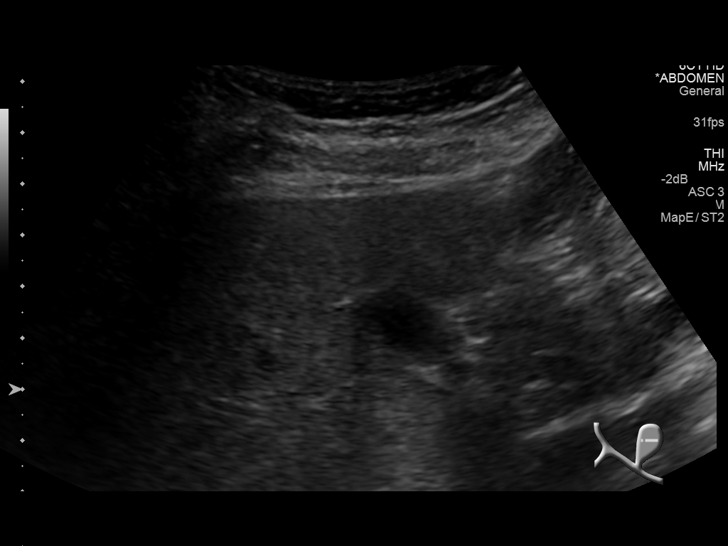
[im 14/54]
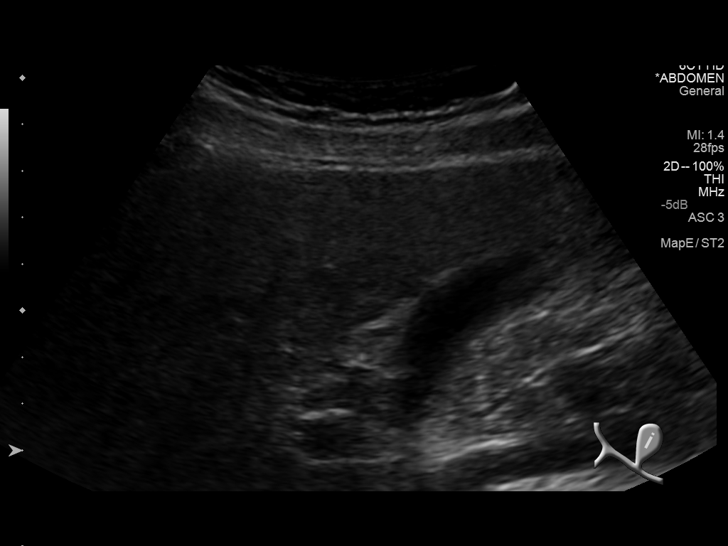
[im 18/54]
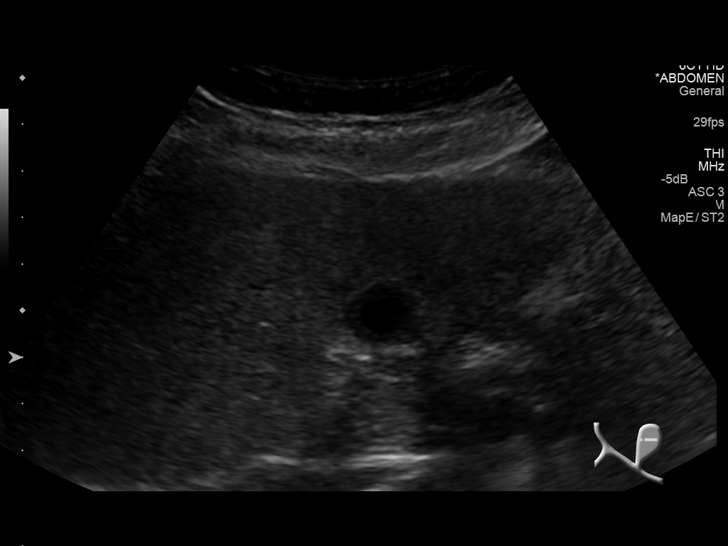
[im 23/54]
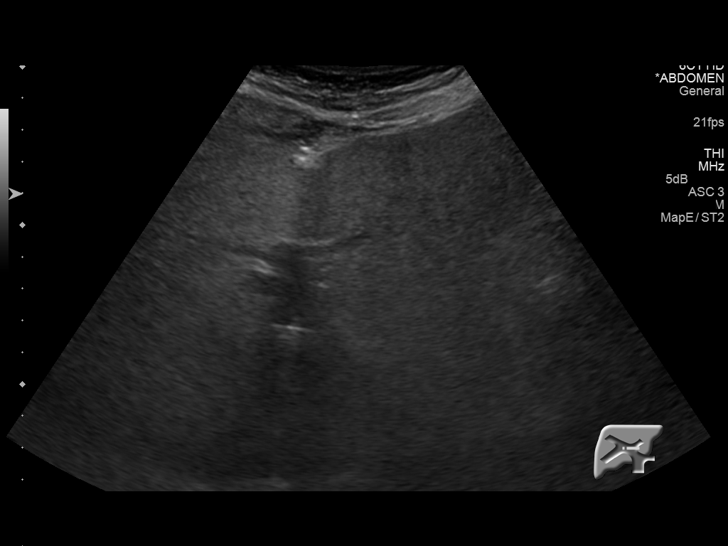
[im 27/54]
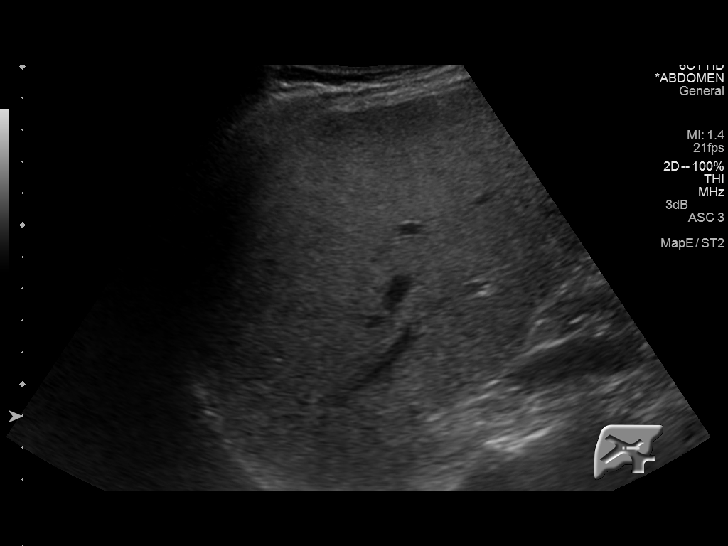
[im 31/54]
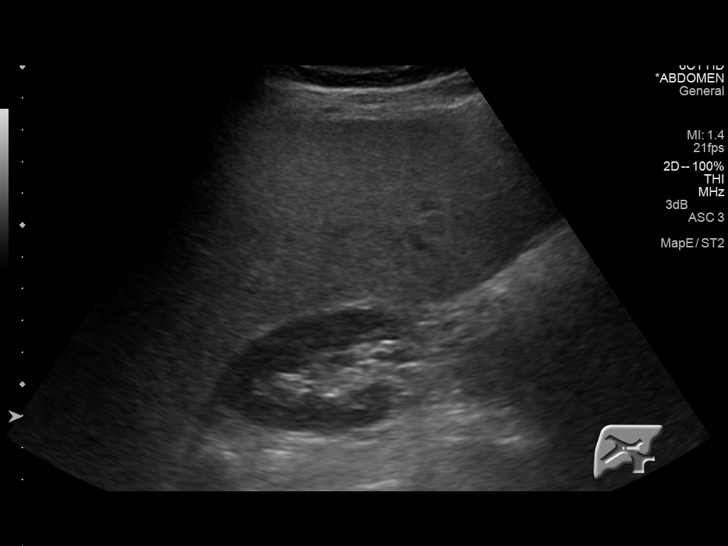
[im 36/54]
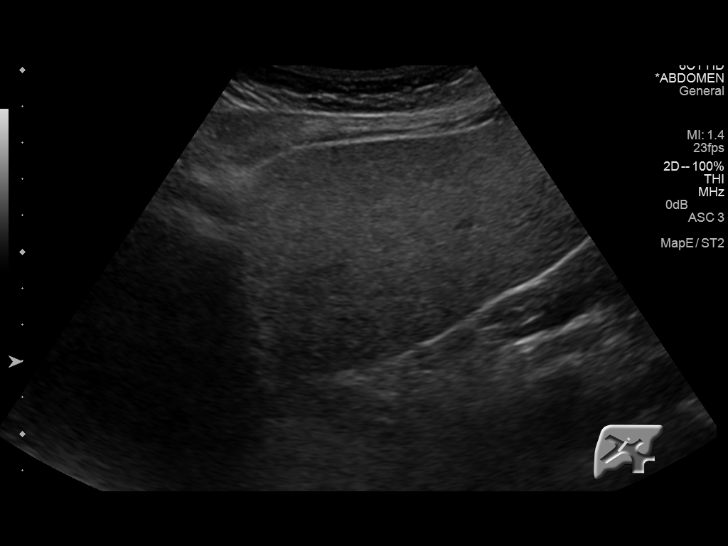
[im 40/54]
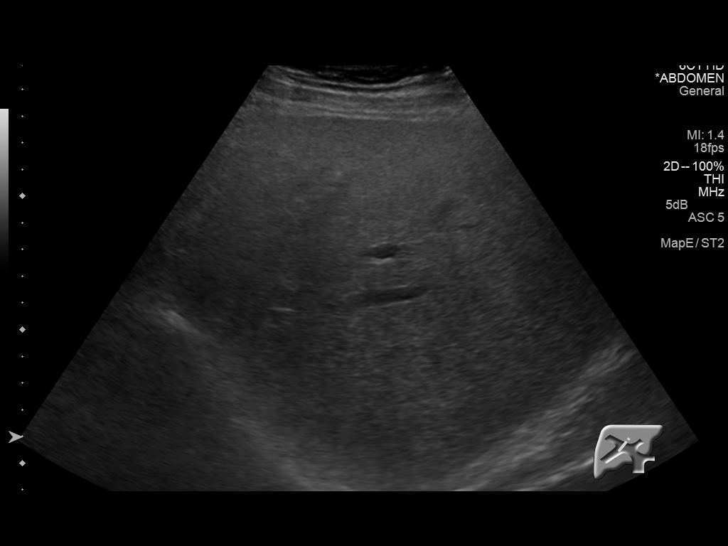
[im 45/54]
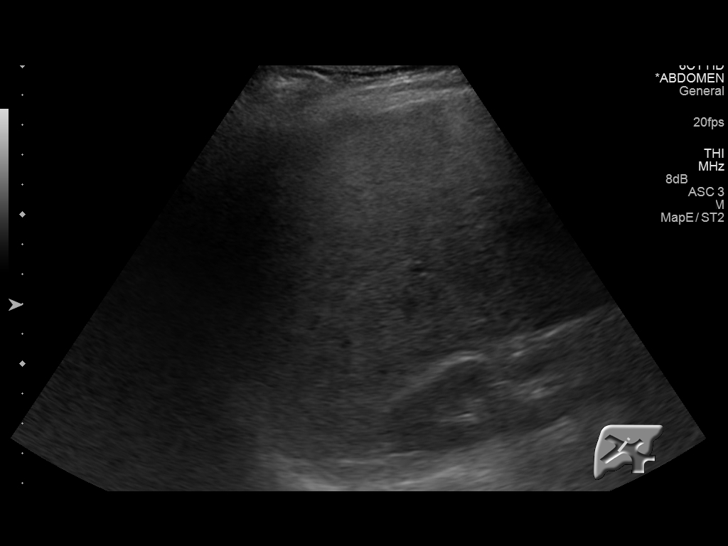
[im 49/54]
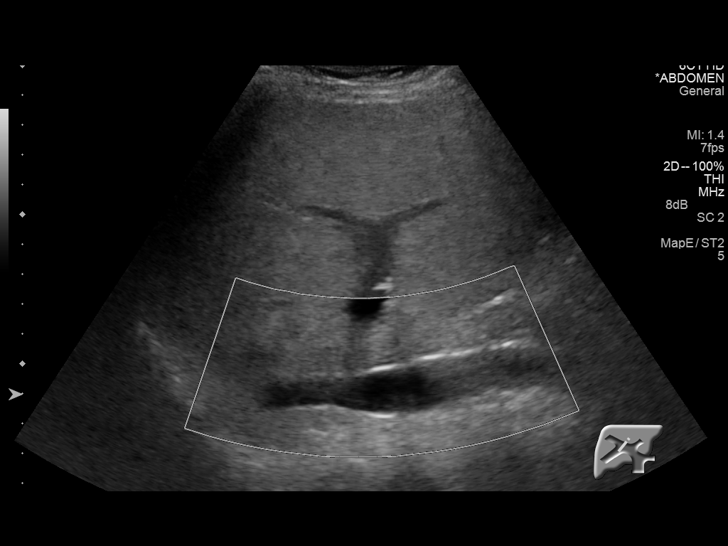
[im 54/54]
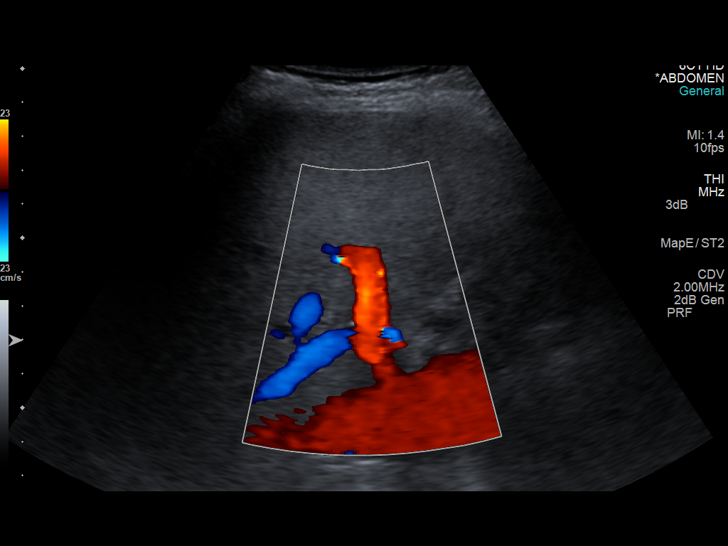

[13 of 25 positions shown; findings below may reference images not displayed]

FINDINGS: Gallbladder:

The gallbladder appears mildly contracted with a wall borderline
thickened. No gallstones are seen. There is no gallbladder wall
edema or pericholecystic fluid. No sonographic Murphy sign noted.

Common bile duct:

Diameter: 3 mm. There is no intrahepatic or extrahepatic biliary
duct dilatation.

Liver:

No focal lesion identified. Liver echogenicity is somewhat coarse
and increased diffusely.
IMPRESSION: Gallbladder or appears mildly contracted, while patient reports
having been NPO for over 10 hours. No gallstones or gallbladder wall
edema is seen. The significance of this mild contraction of the
gallbladder is uncertain. If there is concern for cystic duct
obstruction, nuclear medicine hepatobiliary imaging study could be
helpful to further assess.

No demonstrable biliary duct dilatation.

Liver echogenicity is somewhat coarsened increased. These findings
raise concern for underlying hepatic steatosis and/or parenchymal
liver disease. While no focal liver lesions are identified, it must
be cautioned that the sensitivity of ultrasound for focal liver
lesions is diminished in this circumstance.

## 2017-06-03 ENCOUNTER — Encounter: Payer: Self-pay | Admitting: Orthopaedic Surgery

## 2017-06-03 ENCOUNTER — Ambulatory Visit: Payer: Self-pay | Admitting: Orthopaedic Surgery

## 2017-06-03 VITALS — BP 99/61 | HR 71 | Temp 97.5°F | Ht 68.0 in | Wt 162.0 lb

## 2017-06-03 DIAGNOSIS — M545 Low back pain: Secondary | ICD-10-CM

## 2017-06-03 DIAGNOSIS — G8929 Other chronic pain: Secondary | ICD-10-CM

## 2017-06-03 MED ORDER — HYDROCODONE-ACETAMINOPHEN 7.5-325 MG PO TABS: 1.0000 | ORAL_TABLET | Freq: Four times a day (QID) | ORAL | 0 refills | Status: DC | PRN

## 2017-06-03 NOTE — Progress Notes (Signed)
[name], female DOB:[birth year], 52 y.o. TTS:[id number]  Chief Complaint  Patient presents with  . Back Pain    HPI  Kaylee Ochoa is a 52 y.o. female who has chronic lower back pain.  She has good and bad days.  The cold weather makes her worse.  She had a rough week the other week.  She has joined a gym and is working out.  She thinks it helps. HPI  Body mass index is 24.63 kg/m.  ROS  Review of Systems  Constitutional:       Patient does not have Diabetes Mellitus. Patient has hypertension. Patient has COPD or shortness of breath. Patient does not have BMI > 35. Patient has current smoking history.  HENT: Negative for congestion.   Respiratory: Positive for cough and shortness of breath.   Cardiovascular: Negative for chest pain.  Endocrine: Positive for cold intolerance.  Musculoskeletal: Positive for arthralgias and back pain.  Allergic/Immunologic: Positive for environmental allergies.  All other systems reviewed and are negative.   Past Medical History:  Diagnosis Date  . Depression   . ETOH abuse   . Hypertension   . Renal disorder     Past Surgical History:  Procedure Laterality Date  . BREAST SURGERY    . cyst removal on breast    . DILITATION & CURRETTAGE/HYSTROSCOPY WITH NOVASURE ABLATION N/A 02/27/2016   Procedure: DILATATION & CURETTAGE/HYSTEROSCOPY  Procedure #2;  Surgeon: [name], MD;  Location: [facility];  Service: Gynecology;  Laterality: N/A;  . LAPAROSCOPIC BILATERAL SALPINGECTOMY N/A 02/27/2016   Procedure: LAPAROSCOPIC BILATERAL SALPINGECTOMY; LAPAROSCOPIC LYSIS OF ADHESIONS Procedure #1;  Surgeon: [name], MD;  Location: [facility];  Service: Gynecology;  Laterality: N/A;    Family History  Problem Relation Age of Onset  . Muscular dystrophy Mother   . Other Father        died in house fire  . Cancer Sister        esophageal  . Cancer Maternal Grandmother   . Heart attack Maternal Grandfather     Social  History Social History   Tobacco Use  . Smoking status: Current Every Day Smoker    Packs/day: 0.25    Years: 20.00    Pack years: 5.00    Types: Cigarettes  . Smokeless tobacco: Never Used  Substance Use Topics  . Alcohol use: No    Comment: stopped July 2016  . Drug use: No    Allergies  Allergen Reactions  . Other Hives, Itching and Rash    UNKNOWN ANTIBIOTIC: Patient cannot recall name of the medication but it casued a rash all over the body, hives, and itching  . Sulfa Antibiotics Hives    Current Outpatient Medications  Medication Sig Dispense Refill  . albuterol (PROVENTIL HFA;VENTOLIN HFA) 108 (90 BASE) MCG/ACT inhaler Inhale 2 puffs into the lungs every 6 (six) hours as needed for wheezing or shortness of breath. 1 Inhaler 0  . busPIRone (BUSPAR) 10 MG tablet Take 10 mg by mouth 3 (three) times daily.  2  . HYDROcodone-acetaminophen (NORCO) 7.5-325 MG tablet Take 1 tablet by mouth every 6 (six) hours as needed for moderate pain (Must last 30 days). 60 tablet 0  . lisinopril (PRINIVIL,ZESTRIL) 40 MG tablet Take 40 mg by mouth daily.  6  . loratadine (CLARITIN) 10 MG tablet Take 10 mg by mouth daily.    . mirtazapine (REMERON) 15 MG tablet Take 15 mg by mouth at bedtime.  2  .  Multiple Vitamin (MULTIVITAMIN WITH MINERALS) TABS tablet Take 1 tablet by mouth daily.    . norethindrone (MICRONOR,CAMILA,ERRIN) 0.35 MG tablet Take 1 tablet by mouth daily.    . Omega-3 Fatty Acids (FISH OIL PO) Take 1 capsule by mouth daily.    . promethazine (PHENERGAN) 25 MG tablet Take 1 tablet (25 mg total) by mouth every 6 (six) hours as needed for nausea or vomiting. 30 tablet 1  . nicotine (NICODERM CQ - DOSED IN MG/24 HOURS) 21 mg/24hr patch Place 1 patch (21 mg total) onto the skin daily. (Patient not taking: Reported on 04/03/2017) 28 patch 0   No current facility-administered medications for this visit.      Physical Exam  Blood pressure 99/61, pulse 71, temperature (!) 97.5 F  (36.4 C), height 5\' 8"  (1.727 m), weight 162 lb (73.5 kg).  Constitutional: overall normal hygiene, normal nutrition, well developed, normal grooming, normal body habitus. Assistive device:none  Musculoskeletal: gait and station Limp none, muscle tone and strength are normal, no tremors or atrophy is present.  .  Neurological: coordination overall normal.  Deep tendon reflex/nerve stretch intact.  Sensation normal.  Cranial nerves II-XII intact.   Skin:   Normal overall no scars, lesions, ulcers or rashes. No psoriasis.  Psychiatric: Alert and oriented x 3.  Recent memory intact, remote memory unclear.  Normal mood and affect. Well groomed.  Good eye contact.  Cardiovascular: overall no swelling, no varicosities, no edema bilaterally, normal temperatures of the legs and arms, no clubbing, cyanosis and good capillary refill.  Lymphatic: palpation is normal.  Spine/Pelvis examination:  Inspection:  Overall, sacoiliac joint benign and hips nontender; without crepitus or defects.   Thoracic spine inspection: Alignment normal without kyphosis present   Lumbar spine inspection:  Alignment  with normal lumbar lordosis, without scoliosis apparent.   Thoracic spine palpation:  without tenderness of spinal processes   Lumbar spine palpation: without tenderness of lumbar area; without tightness of lumbar muscles    Range of Motion:   Lumbar flexion, forward flexion is normal without pain or tenderness    Lumbar extension is full without pain or tenderness   Left lateral bend is normal without pain or tenderness   Right lateral bend is normal without pain or tenderness   Straight leg raising is normal  Strength & tone: normal   Stability overall normal stability All other systems reviewed and are negative   The patient has been educated about the nature of the problem(s) and counseled on treatment options.  The patient appeared to understand what I have discussed and is in agreement with  it.  Encounter Diagnosis  Name Primary?  . Chronic midline low back pain without sciatica Yes    PLAN Call if any problems.  Precautions discussed.  Continue current medications.   Return to clinic 3 months   I have reviewed the [facility] web site prior to prescribing narcotic medicine for this patient.  Electronically Signed Sanjuana Kava, MD 2/19/20198:21 AM

## 2017-06-03 NOTE — Patient Instructions (Signed)
Steps to Quit Smoking Smoking tobacco can be bad for your health. It can also affect almost every organ in your body. Smoking puts you and people around you at risk for many serious long-lasting (chronic) diseases. Quitting smoking is hard, but it is one of the best things that you can do for your health. It is never too late to quit. What are the benefits of quitting smoking? When you quit smoking, you lower your risk for getting serious diseases and conditions. They can include:  Lung cancer or lung disease.  Heart disease.  Stroke.  Heart attack.  Not being able to have children (infertility).  Weak bones (osteoporosis) and broken bones (fractures).  If you have coughing, wheezing, and shortness of breath, those symptoms may get better when you quit. You may also get sick less often. If you are pregnant, quitting smoking can help to lower your chances of having a baby of low birth weight. What can I do to help me quit smoking? Talk with your doctor about what can help you quit smoking. Some things you can do (strategies) include:  Quitting smoking totally, instead of slowly cutting back how much you smoke over a period of time.  Going to in-person counseling. You are more likely to quit if you go to many counseling sessions.  Using resources and support systems, such as: ? Online chats with a counselor. ? Phone quitlines. ? Printed self-help materials. ? Support groups or group counseling. ? Text messaging programs. ? Mobile phone apps or applications.  Taking medicines. Some of these medicines may have nicotine in them. If you are pregnant or breastfeeding, do not take any medicines to quit smoking unless your doctor says it is okay. Talk with your doctor about counseling or other things that can help you.  Talk with your doctor about using more than one strategy at the same time, such as taking medicines while you are also going to in-person counseling. This can help make  quitting easier. What things can I do to make it easier to quit? Quitting smoking might feel very hard at first, but there is a lot that you can do to make it easier. Take these steps:  Talk to your family and friends. Ask them to support and encourage you.  Call phone quitlines, reach out to support groups, or work with a counselor.  Ask people who smoke to not smoke around you.  Avoid places that make you want (trigger) to smoke, such as: ? Bars. ? Parties. ? Smoke-break areas at work.  Spend time with people who do not smoke.  Lower the stress in your life. Stress can make you want to smoke. Try these things to help your stress: ? Getting regular exercise. ? Deep-breathing exercises. ? Yoga. ? Meditating. ? Doing a body scan. To do this, close your eyes, focus on one area of your body at a time from head to toe, and notice which parts of your body are tense. Try to relax the muscles in those areas.  Download or buy apps on your mobile phone or tablet that can help you stick to your quit plan. There are many free apps, such as QuitGuide from the CDC (Centers for Disease Control and Prevention). You can find more support from smokefree.gov and other websites.  This information is not intended to replace advice given to you by your health care provider. Make sure you discuss any questions you have with your health care provider. Document Released: 01/26/2009 Document   Revised: 11/28/2015 Document Reviewed: 08/16/2014 Elsevier Interactive Patient Education  2018 Elsevier Inc.  

## 2017-06-05 ENCOUNTER — Ambulatory Visit: Payer: Self-pay | Admitting: Orthopaedic Surgery

## 2017-06-16 ENCOUNTER — Encounter: Payer: Self-pay | Admitting: Obstetrics and Gynecology

## 2017-07-01 ENCOUNTER — Other Ambulatory Visit: Payer: Self-pay | Admitting: Orthopaedic Surgery

## 2017-07-01 MED ORDER — HYDROCODONE-ACETAMINOPHEN 7.5-325 MG PO TABS: 1.0000 | ORAL_TABLET | Freq: Four times a day (QID) | ORAL | 0 refills | Status: DC | PRN

## 2017-07-30 ENCOUNTER — Other Ambulatory Visit: Payer: Self-pay | Admitting: Orthopedic Surgery

## 2017-07-30 MED ORDER — HYDROCODONE-ACETAMINOPHEN 7.5-325 MG PO TABS: 1.0000 | ORAL_TABLET | Freq: Four times a day (QID) | ORAL | 0 refills | Status: DC | PRN

## 2017-08-28 DIAGNOSIS — Z029 Encounter for administrative examinations, unspecified: Secondary | ICD-10-CM

## 2017-09-03 ENCOUNTER — Ambulatory Visit: Payer: Self-pay | Admitting: Orthopaedic Surgery

## 2017-09-03 ENCOUNTER — Encounter: Payer: Self-pay | Admitting: Orthopaedic Surgery

## 2017-09-03 VITALS — BP 105/68 | HR 59 | Ht 67.0 in | Wt 157.0 lb

## 2017-09-03 DIAGNOSIS — G8929 Other chronic pain: Secondary | ICD-10-CM

## 2017-09-03 DIAGNOSIS — M545 Low back pain, unspecified: Secondary | ICD-10-CM

## 2017-09-03 MED ORDER — HYDROCODONE-ACETAMINOPHEN 7.5-325 MG PO TABS: 1.0000 | ORAL_TABLET | Freq: Four times a day (QID) | ORAL | 0 refills | Status: DC | PRN

## 2017-09-03 NOTE — Progress Notes (Signed)
[name], female DOB:[birth year], 52 y.o. UJW:[id number]  Chief Complaint  Patient presents with  . Back Pain    lumbar    HPI  Kaylee Ochoa is a 52 y.o. female who has chronic stable lower back pain.  She is doing well with the medicine and taking her exercises.  She has no new acute episodes. HPI  Body mass index is 24.59 kg/m.  ROS  Review of Systems  Constitutional:       Patient does not have Diabetes Mellitus. Patient has hypertension. Patient has COPD or shortness of breath. Patient does not have BMI > 35. Patient has current smoking history.  HENT: Negative for congestion.   Respiratory: Positive for cough and shortness of breath.   Cardiovascular: Negative for chest pain.  Endocrine: Positive for cold intolerance.  Musculoskeletal: Positive for arthralgias and back pain.  Allergic/Immunologic: Positive for environmental allergies.  All other systems reviewed and are negative.   Past Medical History:  Diagnosis Date  . Depression   . ETOH abuse   . Hypertension   . Renal disorder     Past Surgical History:  Procedure Laterality Date  . BREAST SURGERY    . cyst removal on breast    . DILITATION & CURRETTAGE/HYSTROSCOPY WITH NOVASURE ABLATION N/A 02/27/2016   Procedure: DILATATION & CURETTAGE/HYSTEROSCOPY  Procedure #2;  Surgeon: [name], MD;  Location: [facility];  Service: Gynecology;  Laterality: N/A;  . LAPAROSCOPIC BILATERAL SALPINGECTOMY N/A 02/27/2016   Procedure: LAPAROSCOPIC BILATERAL SALPINGECTOMY; LAPAROSCOPIC LYSIS OF ADHESIONS Procedure #1;  Surgeon: [name], MD;  Location: [facility];  Service: Gynecology;  Laterality: N/A;    Family History  Problem Relation Age of Onset  . Muscular dystrophy Mother   . Other Father        died in house fire  . Cancer Sister        esophageal  . Cancer Maternal Grandmother   . Heart attack Maternal Grandfather     Social History Social History   Tobacco Use  . Smoking  status: Current Every Day Smoker    Packs/day: 0.25    Years: 20.00    Pack years: 5.00    Types: Cigarettes  . Smokeless tobacco: Never Used  Substance Use Topics  . Alcohol use: No    Comment: stopped July 2016  . Drug use: No    Allergies  Allergen Reactions  . Other Hives, Itching and Rash    UNKNOWN ANTIBIOTIC: Patient cannot recall name of the medication but it casued a rash all over the body, hives, and itching  . Sulfa Antibiotics Hives    Current Outpatient Medications  Medication Sig Dispense Refill  . albuterol (PROVENTIL HFA;VENTOLIN HFA) 108 (90 BASE) MCG/ACT inhaler Inhale 2 puffs into the lungs every 6 (six) hours as needed for wheezing or shortness of breath. 1 Inhaler 0  . busPIRone (BUSPAR) 10 MG tablet Take 10 mg by mouth 3 (three) times daily.  2  . HYDROcodone-acetaminophen (NORCO) 7.5-325 MG tablet Take 1 tablet by mouth every 6 (six) hours as needed for moderate pain (Must last 30 days). 60 tablet 0  . lisinopril (PRINIVIL,ZESTRIL) 40 MG tablet Take 40 mg by mouth daily.  6  . loratadine (CLARITIN) 10 MG tablet Take 10 mg by mouth daily.    Marland Kitchen lovastatin (MEVACOR) 20 MG tablet   6  . mirtazapine (REMERON) 15 MG tablet Take 15 mg by mouth at bedtime.  2  . Multiple Vitamin (  MULTIVITAMIN WITH MINERALS) TABS tablet Take 1 tablet by mouth daily.    . nicotine (NICODERM CQ - DOSED IN MG/24 HOURS) 21 mg/24hr patch Place 1 patch (21 mg total) onto the skin daily. 28 patch 0  . norethindrone (MICRONOR,CAMILA,ERRIN) 0.35 MG tablet Take 1 tablet by mouth daily.    . Omega-3 Fatty Acids (FISH OIL PO) Take 1 capsule by mouth daily.    . promethazine (PHENERGAN) 25 MG tablet Take 1 tablet (25 mg total) by mouth every 6 (six) hours as needed for nausea or vomiting. 30 tablet 1   No current facility-administered medications for this visit.      Physical Exam  Blood pressure 105/68, pulse (!) 59, height 5\' 7"  (1.702 m), weight 157 lb (71.2 kg).  Constitutional: overall  normal hygiene, normal nutrition, well developed, normal grooming, normal body habitus. Assistive device:none  Musculoskeletal: gait and station Limp none, muscle tone and strength are normal, no tremors or atrophy is present.  .  Neurological: coordination overall normal.  Deep tendon reflex/nerve stretch intact.  Sensation normal.  Cranial nerves II-XII intact.   Skin:   Normal overall no scars, lesions, ulcers or rashes. No psoriasis.  Psychiatric: Alert and oriented x 3.  Recent memory intact, remote memory unclear.  Normal mood and affect. Well groomed.  Good eye contact.  Cardiovascular: overall no swelling, no varicosities, no edema bilaterally, normal temperatures of the legs and arms, no clubbing, cyanosis and good capillary refill.  Lymphatic: palpation is normal.  Spine/Pelvis examination:  Inspection:  Overall, sacoiliac joint benign and hips nontender; without crepitus or defects.   Thoracic spine inspection: Alignment normal without kyphosis present   Lumbar spine inspection:  Alignment  with normal lumbar lordosis, without scoliosis apparent.   Thoracic spine palpation:  without tenderness of spinal processes   Lumbar spine palpation: without tenderness of lumbar area; without tightness of lumbar muscles    Range of Motion:   Lumbar flexion, forward flexion is normal without pain or tenderness    Lumbar extension is full without pain or tenderness   Left lateral bend is normal without pain or tenderness   Right lateral bend is normal without pain or tenderness   Straight leg raising is normal  Strength & tone: normal   Stability overall normal stability All other systems reviewed and are negative   The patient has been educated about the nature of the problem(s) and counseled on treatment options.  The patient appeared to understand what I have discussed and is in agreement with it.  Encounter Diagnosis  Name Primary?  . Chronic midline low back pain without  sciatica Yes    PLAN Call if any problems.  Precautions discussed.  Continue current medications.   Return to clinic 3 months   I have reviewed the [place] web site prior to prescribing narcotic medicine for this patient.  Electronically Signed Sanjuana Kava, MD 5/22/20198:54 AM

## 2017-10-01 ENCOUNTER — Other Ambulatory Visit: Payer: Self-pay | Admitting: Orthopaedic Surgery

## 2017-10-01 MED ORDER — HYDROCODONE-ACETAMINOPHEN 7.5-325 MG PO TABS: 1.0000 | ORAL_TABLET | Freq: Four times a day (QID) | ORAL | 0 refills | Status: DC | PRN

## 2017-10-29 ENCOUNTER — Other Ambulatory Visit: Payer: Self-pay | Admitting: Orthopaedic Surgery

## 2017-10-30 MED ORDER — HYDROCODONE-ACETAMINOPHEN 7.5-325 MG PO TABS: 1.0000 | ORAL_TABLET | Freq: Four times a day (QID) | ORAL | 0 refills | Status: DC | PRN

## 2017-12-03 ENCOUNTER — Encounter: Payer: Self-pay | Admitting: Orthopaedic Surgery

## 2017-12-03 ENCOUNTER — Ambulatory Visit: Payer: Self-pay | Admitting: Orthopaedic Surgery

## 2017-12-03 VITALS — BP 117/70 | HR 79 | Ht 67.0 in | Wt 157.4 lb

## 2017-12-03 DIAGNOSIS — G8929 Other chronic pain: Secondary | ICD-10-CM

## 2017-12-03 DIAGNOSIS — M545 Low back pain: Secondary | ICD-10-CM

## 2017-12-03 DIAGNOSIS — F17210 Nicotine dependence, cigarettes, uncomplicated: Secondary | ICD-10-CM

## 2017-12-03 MED ORDER — HYDROCODONE-ACETAMINOPHEN 7.5-325 MG PO TABS: 1.0000 | ORAL_TABLET | Freq: Four times a day (QID) | ORAL | 0 refills | Status: DC | PRN

## 2017-12-03 NOTE — Progress Notes (Signed)
[name], female DOB:[birth year], 52 y.o. KGY:[id number]  Chief Complaint  Patient presents with  . Follow-up    LBP    HPI  Kaylee Ochoa is a 52 y.o. female who has chronic lower back pain.  She has had some increased pain secondary to having four children at home this summer.  She has no new trauma, no paresthesias.  She has no weakness. She is taking her medicine and doing her exercises.   Body mass index is 24.65 kg/m.  ROS  Review of Systems  Constitutional:       Patient does not have Diabetes Mellitus. Patient has hypertension. Patient has COPD or shortness of breath. Patient does not have BMI > 35. Patient has current smoking history.  HENT: Negative for congestion.   Respiratory: Positive for cough and shortness of breath.   Cardiovascular: Negative for chest pain.  Endocrine: Positive for cold intolerance.  Musculoskeletal: Positive for arthralgias and back pain.  Allergic/Immunologic: Positive for environmental allergies.  All other systems reviewed and are negative.   All other systems reviewed and are negative.  The following is a summary of the past history medically, past history surgically, known current medicines, social history and family history.  This information is gathered electronically by the computer from prior information and documentation.  I review this each visit and have found including this information at this point in the chart is beneficial and informative.    Past Medical History:  Diagnosis Date  . Depression   . ETOH abuse   . Hypertension   . Renal disorder     Past Surgical History:  Procedure Laterality Date  . BREAST SURGERY    . cyst removal on breast    . DILITATION & CURRETTAGE/HYSTROSCOPY WITH NOVASURE ABLATION N/A 02/27/2016   Procedure: DILATATION & CURETTAGE/HYSTEROSCOPY  Procedure #2;  Surgeon: [name], MD;  Location: [facility];  Service: Gynecology;  Laterality: N/A;  . LAPAROSCOPIC BILATERAL  SALPINGECTOMY N/A 02/27/2016   Procedure: LAPAROSCOPIC BILATERAL SALPINGECTOMY; LAPAROSCOPIC LYSIS OF ADHESIONS Procedure #1;  Surgeon: [name], MD;  Location: [facility];  Service: Gynecology;  Laterality: N/A;    Family History  Problem Relation Age of Onset  . Muscular dystrophy Mother   . Other Father        died in house fire  . Cancer Sister        esophageal  . Cancer Maternal Grandmother   . Heart attack Maternal Grandfather     Social History Social History   Tobacco Use  . Smoking status: Current Every Day Smoker    Packs/day: 0.25    Years: 20.00    Pack years: 5.00    Types: Cigarettes  . Smokeless tobacco: Never Used  Substance Use Topics  . Alcohol use: No    Comment: stopped July 2016  . Drug use: No    Allergies  Allergen Reactions  . Other Hives, Itching and Rash    UNKNOWN ANTIBIOTIC: Patient cannot recall name of the medication but it casued a rash all over the body, hives, and itching  . Sulfa Antibiotics Hives    Current Outpatient Medications  Medication Sig Dispense Refill  . albuterol (PROVENTIL HFA;VENTOLIN HFA) 108 (90 BASE) MCG/ACT inhaler Inhale 2 puffs into the lungs every 6 (six) hours as needed for wheezing or shortness of breath. 1 Inhaler 0  . busPIRone (BUSPAR) 10 MG tablet Take 10 mg by mouth 3 (three) times daily.  2  . HYDROcodone-acetaminophen (  NORCO) 7.5-325 MG tablet Take 1 tablet by mouth every 6 (six) hours as needed for moderate pain (Must last 30 days). 60 tablet 0  . lisinopril (PRINIVIL,ZESTRIL) 40 MG tablet Take 40 mg by mouth daily.  6  . loratadine (CLARITIN) 10 MG tablet Take 10 mg by mouth daily.    Marland Kitchen lovastatin (MEVACOR) 20 MG tablet   6  . mirtazapine (REMERON) 15 MG tablet Take 15 mg by mouth at bedtime.  2  . Multiple Vitamin (MULTIVITAMIN WITH MINERALS) TABS tablet Take 1 tablet by mouth daily.    . nicotine (NICODERM CQ - DOSED IN MG/24 HOURS) 21 mg/24hr patch Place 1 patch (21 mg total) onto the skin daily.  28 patch 0  . norethindrone (MICRONOR,CAMILA,ERRIN) 0.35 MG tablet Take 1 tablet by mouth daily.    . Omega-3 Fatty Acids (FISH OIL PO) Take 1 capsule by mouth daily.    . promethazine (PHENERGAN) 25 MG tablet Take 1 tablet (25 mg total) by mouth every 6 (six) hours as needed for nausea or vomiting. 30 tablet 1   No current facility-administered medications for this visit.      Physical Exam  Blood pressure 117/70, pulse 79, height 5\' 7"  (1.702 m), weight 157 lb 6.4 oz (71.4 kg).  Constitutional: overall normal hygiene, normal nutrition, well developed, normal grooming, normal body habitus. Assistive device:none  Musculoskeletal: gait and station Limp none, muscle tone and strength are normal, no tremors or atrophy is present.  .  Neurological: coordination overall normal.  Deep tendon reflex/nerve stretch intact.  Sensation normal.  Cranial nerves II-XII intact.   Skin:   Normal overall no scars, lesions, ulcers or rashes. No psoriasis.  Psychiatric: Alert and oriented x 3.  Recent memory intact, remote memory unclear.  Normal mood and affect. Well groomed.  Good eye contact.  Cardiovascular: overall no swelling, no varicosities, no edema bilaterally, normal temperatures of the legs and arms, no clubbing, cyanosis and good capillary refill.  Lymphatic: palpation is normal.  Spine/Pelvis examination:  Inspection:  Overall, sacoiliac joint benign and hips nontender; without crepitus or defects.   Thoracic spine inspection: Alignment normal without kyphosis present   Lumbar spine inspection:  Alignment  with normal lumbar lordosis, without scoliosis apparent.   Thoracic spine palpation:  without tenderness of spinal processes   Lumbar spine palpation: without tenderness of lumbar area; without tightness of lumbar muscles    Range of Motion:   Lumbar flexion, forward flexion is normal without pain or tenderness    Lumbar extension is full without pain or tenderness   Left  lateral bend is normal without pain or tenderness   Right lateral bend is normal without pain or tenderness   Straight leg raising is normal  Strength & tone: normal   Stability overall normal stability All other systems reviewed and are negative   The patient has been educated about the nature of the problem(s) and counseled on treatment options.  The patient appeared to understand what I have discussed and is in agreement with it.  Encounter Diagnoses  Name Primary?  . Chronic midline low back pain without sciatica Yes  . Cigarette nicotine dependence without complication     PLAN Call if any problems.  Precautions discussed.  Continue current medications.   Return to clinic 3 months   I have reviewed the [place] web site prior to prescribing narcotic medicine for this patient.  The patient has read and signed an Opioid Treatment  Agreement which has been scanned and added to the medical record.  The patient understands the agreement and agrees to abide with it.  The patient has chronic pain that is being treated with an opioid which relieves the pain.  The patient understands potential complications with chronic opioid treatment.  Electronically Signed Sanjuana Kava, MD 8/21/20198:24 AM

## 2017-12-31 ENCOUNTER — Other Ambulatory Visit: Payer: Self-pay

## 2017-12-31 MED ORDER — HYDROCODONE-ACETAMINOPHEN 7.5-325 MG PO TABS: 1.0000 | ORAL_TABLET | Freq: Four times a day (QID) | ORAL | 0 refills | Status: DC | PRN

## 2018-01-29 ENCOUNTER — Other Ambulatory Visit: Payer: Self-pay

## 2018-01-29 MED ORDER — HYDROCODONE-ACETAMINOPHEN 7.5-325 MG PO TABS: 1.0000 | ORAL_TABLET | Freq: Four times a day (QID) | ORAL | 0 refills | Status: DC | PRN

## 2018-03-03 ENCOUNTER — Ambulatory Visit: Payer: Self-pay | Admitting: Orthopaedic Surgery

## 2018-03-03 ENCOUNTER — Encounter: Payer: Self-pay | Admitting: Orthopaedic Surgery

## 2018-03-03 VITALS — BP 98/62 | HR 75 | Ht 67.0 in | Wt 164.0 lb

## 2018-03-03 DIAGNOSIS — M545 Low back pain: Secondary | ICD-10-CM

## 2018-03-03 DIAGNOSIS — G8929 Other chronic pain: Secondary | ICD-10-CM

## 2018-03-03 DIAGNOSIS — F17210 Nicotine dependence, cigarettes, uncomplicated: Secondary | ICD-10-CM

## 2018-03-03 MED ORDER — HYDROCODONE-ACETAMINOPHEN 7.5-325 MG PO TABS: 1.0000 | ORAL_TABLET | Freq: Four times a day (QID) | ORAL | 0 refills | Status: DC | PRN

## 2018-03-03 NOTE — Patient Instructions (Signed)
Steps to Quit Smoking Smoking tobacco can be bad for your health. It can also affect almost every organ in your body. Smoking puts you and people around you at risk for many serious long-lasting (chronic) diseases. Quitting smoking is hard, but it is one of the best things that you can do for your health. It is never too late to quit. What are the benefits of quitting smoking? When you quit smoking, you lower your risk for getting serious diseases and conditions. They can include:  Lung cancer or lung disease.  Heart disease.  Stroke.  Heart attack.  Not being able to have children (infertility).  Weak bones (osteoporosis) and broken bones (fractures).  If you have coughing, wheezing, and shortness of breath, those symptoms may get better when you quit. You may also get sick less often. If you are pregnant, quitting smoking can help to lower your chances of having a baby of low birth weight. What can I do to help me quit smoking? Talk with your doctor about what can help you quit smoking. Some things you can do (strategies) include:  Quitting smoking totally, instead of slowly cutting back how much you smoke over a period of time.  Going to in-person counseling. You are more likely to quit if you go to many counseling sessions.  Using resources and support systems, such as: ? Online chats with a counselor. ? Phone quitlines. ? Printed self-help materials. ? Support groups or group counseling. ? Text messaging programs. ? Mobile phone apps or applications.  Taking medicines. Some of these medicines may have nicotine in them. If you are pregnant or breastfeeding, do not take any medicines to quit smoking unless your doctor says it is okay. Talk with your doctor about counseling or other things that can help you.  Talk with your doctor about using more than one strategy at the same time, such as taking medicines while you are also going to in-person counseling. This can help make  quitting easier. What things can I do to make it easier to quit? Quitting smoking might feel very hard at first, but there is a lot that you can do to make it easier. Take these steps:  Talk to your family and friends. Ask them to support and encourage you.  Call phone quitlines, reach out to support groups, or work with a counselor.  Ask people who smoke to not smoke around you.  Avoid places that make you want (trigger) to smoke, such as: ? Bars. ? Parties. ? Smoke-break areas at work.  Spend time with people who do not smoke.  Lower the stress in your life. Stress can make you want to smoke. Try these things to help your stress: ? Getting regular exercise. ? Deep-breathing exercises. ? Yoga. ? Meditating. ? Doing a body scan. To do this, close your eyes, focus on one area of your body at a time from head to toe, and notice which parts of your body are tense. Try to relax the muscles in those areas.  Download or buy apps on your mobile phone or tablet that can help you stick to your quit plan. There are many free apps, such as QuitGuide from the CDC (Centers for Disease Control and Prevention). You can find more support from smokefree.gov and other websites.  This information is not intended to replace advice given to you by your health care provider. Make sure you discuss any questions you have with your health care provider. Document Released: 01/26/2009 Document   Revised: 11/28/2015 Document Reviewed: 08/16/2014 Elsevier Interactive Patient Education  2018 Elsevier Inc.  

## 2018-03-03 NOTE — Progress Notes (Signed)
[name], female DOB:10-21-1965, 52 y.o. QTM:[id number]  Chief Complaint  Patient presents with  . Back Pain  . Medication Refill    hydrocodone    HPI  Kaylee Ochoa is a 52 y.o. female who has chronic lower back pain that is worse with the cold weather and the rain we have had.  She has no paresthesias. She has taken up yoga and that helps some.  She is doing her exercises and taking her medicine.  She has no weakness.   Body mass index is 25.69 kg/m.  ROS  Review of Systems  Constitutional:       Patient does not have Diabetes Mellitus. Patient has hypertension. Patient has COPD or shortness of breath. Patient does not have BMI > 35. Patient has current smoking history.  HENT: Negative for congestion.   Respiratory: Positive for cough and shortness of breath.   Cardiovascular: Negative for chest pain.  Endocrine: Positive for cold intolerance.  Musculoskeletal: Positive for arthralgias and back pain.  Allergic/Immunologic: Positive for environmental allergies.  All other systems reviewed and are negative.   All other systems reviewed and are negative.  The following is a summary of the past history medically, past history surgically, known current medicines, social history and family history.  This information is gathered electronically by the computer from prior information and documentation.  I review this each visit and have found including this information at this point in the chart is beneficial and informative.    Past Medical History:  Diagnosis Date  . Depression   . ETOH abuse   . Hypertension   . Renal disorder     Past Surgical History:  Procedure Laterality Date  . BREAST SURGERY    . cyst removal on breast    . DILITATION & CURRETTAGE/HYSTROSCOPY WITH NOVASURE ABLATION N/A 02/27/2016   Procedure: DILATATION & CURETTAGE/HYSTEROSCOPY  Procedure #2;  Surgeon: [name], MD;  Location: [facility];  Service: Gynecology;  Laterality:  N/A;  . LAPAROSCOPIC BILATERAL SALPINGECTOMY N/A 02/27/2016   Procedure: LAPAROSCOPIC BILATERAL SALPINGECTOMY; LAPAROSCOPIC LYSIS OF ADHESIONS Procedure #1;  Surgeon: [name], MD;  Location: [facility];  Service: Gynecology;  Laterality: N/A;    Family History  Problem Relation Age of Onset  . Muscular dystrophy Mother   . Other Father        died in house fire  . Cancer Sister        esophageal  . Cancer Maternal Grandmother   . Heart attack Maternal Grandfather     Social History Social History   Tobacco Use  . Smoking status: Current Every Day Smoker    Packs/day: 0.25    Years: 20.00    Pack years: 5.00    Types: Cigarettes  . Smokeless tobacco: Never Used  Substance Use Topics  . Alcohol use: No    Comment: stopped July 2016  . Drug use: No    Allergies  Allergen Reactions  . Other Hives, Itching and Rash    UNKNOWN ANTIBIOTIC: Patient cannot recall name of the medication but it casued a rash all over the body, hives, and itching  . Sulfa Antibiotics Hives    Current Outpatient Medications  Medication Sig Dispense Refill  . albuterol (PROVENTIL HFA;VENTOLIN HFA) 108 (90 BASE) MCG/ACT inhaler Inhale 2 puffs into the lungs every 6 (six) hours as needed for wheezing or shortness of breath. 1 Inhaler 0  . busPIRone (BUSPAR) 10 MG tablet Take 10 mg by mouth  3 (three) times daily.  2  . HYDROcodone-acetaminophen (NORCO) 7.5-325 MG tablet Take 1 tablet by mouth every 6 (six) hours as needed for moderate pain (Must last 30 days). 56 tablet 0  . levothyroxine (SYNTHROID, LEVOTHROID) 50 MCG tablet Take 50 mcg by mouth daily.  1  . lisinopril (PRINIVIL,ZESTRIL) 40 MG tablet Take 40 mg by mouth daily.  6  . loratadine (CLARITIN) 10 MG tablet Take 10 mg by mouth daily.    Marland Kitchen lovastatin (MEVACOR) 20 MG tablet   6  . mirtazapine (REMERON) 15 MG tablet Take 15 mg by mouth at bedtime.  2  . Multiple Vitamin (MULTIVITAMIN WITH MINERALS) TABS tablet Take 1 tablet by mouth daily.     . naproxen (NAPROSYN) 500 MG tablet TAKE 1 TABLET BY MOUTH TWICE DAILY AS NEEDED FOR MENSTRUAL CRAMPS  5  . nicotine (NICODERM CQ - DOSED IN MG/24 HOURS) 21 mg/24hr patch Place 1 patch (21 mg total) onto the skin daily. 28 patch 0  . norethindrone (MICRONOR,CAMILA,ERRIN) 0.35 MG tablet Take 1 tablet by mouth daily.    . Omega-3 Fatty Acids (FISH OIL PO) Take 1 capsule by mouth daily.     No current facility-administered medications for this visit.      Physical Exam  Blood pressure 98/62, pulse 75, height 5\' 7"  (1.702 m), weight 164 lb (74.4 kg).  Constitutional: overall normal hygiene, normal nutrition, well developed, normal grooming, normal body habitus. Assistive device:none  Musculoskeletal: gait and station Limp none, muscle tone and strength are normal, no tremors or atrophy is present.  .  Neurological: coordination overall normal.  Deep tendon reflex/nerve stretch intact.  Sensation normal.  Cranial nerves II-XII intact.   Skin:   Normal overall no scars, lesions, ulcers or rashes. No psoriasis.  Psychiatric: Alert and oriented x 3.  Recent memory intact, remote memory unclear.  Normal mood and affect. Well groomed.  Good eye contact.  Cardiovascular: overall no swelling, no varicosities, no edema bilaterally, normal temperatures of the legs and arms, no clubbing, cyanosis and good capillary refill.  Lymphatic: palpation is normal.  Spine/Pelvis examination:  Inspection:  Overall, sacoiliac joint benign and hips nontender; without crepitus or defects.   Thoracic spine inspection: Alignment normal without kyphosis present   Lumbar spine inspection:  Alignment  with normal lumbar lordosis, without scoliosis apparent.   Thoracic spine palpation:  without tenderness of spinal processes   Lumbar spine palpation: without tenderness of lumbar area; without tightness of lumbar muscles    Range of Motion:   Lumbar flexion, forward flexion is normal without pain or  tenderness    Lumbar extension is full without pain or tenderness   Left lateral bend is normal without pain or tenderness   Right lateral bend is normal without pain or tenderness   Straight leg raising is normal  Strength & tone: normal   Stability overall normal stability  All other systems reviewed and are negative   The patient has been educated about the nature of the problem(s) and counseled on treatment options.  The patient appeared to understand what I have discussed and is in agreement with it.  Encounter Diagnoses  Name Primary?  . Chronic midline low back pain without sciatica Yes  . Cigarette nicotine dependence without complication     PLAN Call if any problems.  Precautions discussed.  Continue current medications.   Return to clinic 3 months   The patient has read and signed an Opioid Treatment Agreement which has been  scanned and added to the medical record.  The patient understands the agreement and agrees to abide with it.  The patient has chronic pain that is being treated with an opioid which relieves the pain.  The patient understands potential complications with chronic opioid treatment.   I have reviewed the [place] web site prior to prescribing narcotic medicine for this patient.   Electronically Signed Sanjuana Kava, MD 11/19/20198:15 AM

## 2018-03-31 ENCOUNTER — Other Ambulatory Visit: Payer: Self-pay

## 2018-03-31 MED ORDER — HYDROCODONE-ACETAMINOPHEN 7.5-325 MG PO TABS: 1.0000 | ORAL_TABLET | Freq: Four times a day (QID) | ORAL | 0 refills | Status: DC | PRN

## 2018-04-30 ENCOUNTER — Other Ambulatory Visit: Payer: Self-pay

## 2018-04-30 MED ORDER — HYDROCODONE-ACETAMINOPHEN 7.5-325 MG PO TABS: 1.0000 | ORAL_TABLET | Freq: Four times a day (QID) | ORAL | 0 refills | Status: DC | PRN

## 2018-06-02 ENCOUNTER — Ambulatory Visit: Payer: Self-pay | Admitting: Orthopaedic Surgery

## 2018-06-02 ENCOUNTER — Encounter: Payer: Self-pay | Admitting: Orthopaedic Surgery

## 2018-06-02 VITALS — BP 111/68 | HR 67 | Ht 67.5 in | Wt 160.0 lb

## 2018-06-02 DIAGNOSIS — M545 Low back pain, unspecified: Secondary | ICD-10-CM

## 2018-06-02 DIAGNOSIS — G8929 Other chronic pain: Secondary | ICD-10-CM

## 2018-06-02 MED ORDER — HYDROCODONE-ACETAMINOPHEN 7.5-325 MG PO TABS: 1.0000 | ORAL_TABLET | Freq: Four times a day (QID) | ORAL | 0 refills | Status: DC | PRN

## 2018-06-02 NOTE — Patient Instructions (Signed)
Steps to Quit Smoking    Smoking tobacco can be bad for your health. It can also affect almost every organ in your body. Smoking puts you and people around you at risk for many serious long-lasting (chronic) diseases. Quitting smoking is hard, but it is one of the best things that you can do for your health. It is never too late to quit.  What are the benefits of quitting smoking?  When you quit smoking, you lower your risk for getting serious diseases and conditions. They can include:  · Lung cancer or lung disease.  · Heart disease.  · Stroke.  · Heart attack.  · Not being able to have children (infertility).  · Weak bones (osteoporosis) and broken bones (fractures).  If you have coughing, wheezing, and shortness of breath, those symptoms may get better when you quit. You may also get sick less often. If you are pregnant, quitting smoking can help to lower your chances of having a baby of low birth weight.  What can I do to help me quit smoking?  Talk with your doctor about what can help you quit smoking. Some things you can do (strategies) include:  · Quitting smoking totally, instead of slowly cutting back how much you smoke over a period of time.  · Going to in-person counseling. You are more likely to quit if you go to many counseling sessions.  · Using resources and support systems, such as:  ? Online chats with a counselor.  ? Phone quitlines.  ? Printed self-help materials.  ? Support groups or group counseling.  ? Text messaging programs.  ? Mobile phone apps or applications.  · Taking medicines. Some of these medicines may have nicotine in them. If you are pregnant or breastfeeding, do not take any medicines to quit smoking unless your doctor says it is okay. Talk with your doctor about counseling or other things that can help you.  Talk with your doctor about using more than one strategy at the same time, such as taking medicines while you are also going to in-person counseling. This can help make  quitting easier.  What things can I do to make it easier to quit?  Quitting smoking might feel very hard at first, but there is a lot that you can do to make it easier. Take these steps:  · Talk to your family and friends. Ask them to support and encourage you.  · Call phone quitlines, reach out to support groups, or work with a counselor.  · Ask people who smoke to not smoke around you.  · Avoid places that make you want (trigger) to smoke, such as:  ? Bars.  ? Parties.  ? Smoke-break areas at work.  · Spend time with people who do not smoke.  · Lower the stress in your life. Stress can make you want to smoke. Try these things to help your stress:  ? Getting regular exercise.  ? Deep-breathing exercises.  ? Yoga.  ? Meditating.  ? Doing a body scan. To do this, close your eyes, focus on one area of your body at a time from head to toe, and notice which parts of your body are tense. Try to relax the muscles in those areas.  · Download or buy apps on your mobile phone or tablet that can help you stick to your quit plan. There are many free apps, such as QuitGuide from the CDC (Centers for Disease Control and Prevention). You can find more   support from smokefree.gov and other websites.  This information is not intended to replace advice given to you by your health care provider. Make sure you discuss any questions you have with your health care provider.  Document Released: 01/26/2009 Document Revised: 11/28/2015 Document Reviewed: 08/16/2014  [employer]vier Interactive Patient Education © 2019 [employer]

## 2018-06-02 NOTE — Progress Notes (Signed)
Patient QJ:JHERD E Ochoa, female DOB:[birth year], 52 y.o. EYC:[id number]  Chief Complaint  Patient presents with  . Back Pain    HPI  Kaylee Ochoa is a 54 y.o. female who has chronic lower back pain.  She is stable. She has no weakness. She has good and bad days with the cold weather.  She has no new trauma. She is doing her exercises. She has cut back on her smoking.   Body mass index is 24.69 kg/m.  ROS  Review of Systems  Constitutional:       Patient does not have Diabetes Mellitus. Patient has hypertension. Patient has COPD or shortness of breath. Patient does not have BMI > 35. Patient has current smoking history.  HENT: Negative for congestion.   Respiratory: Positive for cough and shortness of breath.   Cardiovascular: Negative for chest pain.  Endocrine: Positive for cold intolerance.  Musculoskeletal: Positive for arthralgias and back pain.  Allergic/Immunologic: Positive for environmental allergies.  All other systems reviewed and are negative.   All other systems reviewed and are negative.  The following is a summary of the past history medically, past history surgically, known current medicines, social history and family history.  This information is gathered electronically by the computer from prior information and documentation.  I review this each visit and have found including this information at this point in the chart is beneficial and informative.    Past Medical History:  Diagnosis Date  . Depression   . ETOH abuse   . Hypertension   . Renal disorder     Past Surgical History:  Procedure Laterality Date  . BREAST SURGERY    . cyst removal on breast    . DILITATION & CURRETTAGE/HYSTROSCOPY WITH NOVASURE ABLATION N/A 02/27/2016   Procedure: DILATATION & CURETTAGE/HYSTEROSCOPY  Procedure #2;  Surgeon: [name], MD;  Location: [facility];  Service: Gynecology;  Laterality: N/A;  . LAPAROSCOPIC BILATERAL SALPINGECTOMY N/A 02/27/2016    Procedure: LAPAROSCOPIC BILATERAL SALPINGECTOMY; LAPAROSCOPIC LYSIS OF ADHESIONS Procedure #1;  Surgeon: [name], MD;  Location: [facility];  Service: Gynecology;  Laterality: N/A;    Family History  Problem Relation Age of Onset  . Muscular dystrophy Mother   . Other Father        died in house fire  . Cancer Sister        esophageal  . Cancer Maternal Grandmother   . Heart attack Maternal Grandfather     Social History Social History   Tobacco Use  . Smoking status: Current Every Day Smoker    Packs/day: 0.25    Years: 20.00    Pack years: 5.00    Types: Cigarettes  . Smokeless tobacco: Never Used  Substance Use Topics  . Alcohol use: No    Comment: stopped July 2016  . Drug use: No    Allergies  Allergen Reactions  . Other Hives, Itching and Rash    UNKNOWN ANTIBIOTIC: Patient cannot recall name of the medication but it casued a rash all over the body, hives, and itching  . Sulfa Antibiotics Hives    Current Outpatient Medications  Medication Sig Dispense Refill  . albuterol (PROVENTIL HFA;VENTOLIN HFA) 108 (90 BASE) MCG/ACT inhaler Inhale 2 puffs into the lungs every 6 (six) hours as needed for wheezing or shortness of breath. 1 Inhaler 0  . busPIRone (BUSPAR) 10 MG tablet Take 10 mg by mouth 3 (three) times daily.  2  . HYDROcodone-acetaminophen (NORCO) 7.5-325 MG tablet Take  1 tablet by mouth every 6 (six) hours as needed for moderate pain (Must last 30 days). 52 tablet 0  . levothyroxine (SYNTHROID, LEVOTHROID) 50 MCG tablet Take 50 mcg by mouth daily.  1  . lisinopril (PRINIVIL,ZESTRIL) 40 MG tablet Take 40 mg by mouth daily.  6  . loratadine (CLARITIN) 10 MG tablet Take 10 mg by mouth daily.    Marland Kitchen lovastatin (MEVACOR) 20 MG tablet   6  . mirtazapine (REMERON) 15 MG tablet Take 15 mg by mouth at bedtime.  2  . Multiple Vitamin (MULTIVITAMIN WITH MINERALS) TABS tablet Take 1 tablet by mouth daily.    . naproxen (NAPROSYN) 500 MG tablet TAKE 1 TABLET BY MOUTH  TWICE DAILY AS NEEDED FOR MENSTRUAL CRAMPS  5  . nicotine (NICODERM CQ - DOSED IN MG/24 HOURS) 21 mg/24hr patch Place 1 patch (21 mg total) onto the skin daily. 28 patch 0  . norethindrone (MICRONOR,CAMILA,ERRIN) 0.35 MG tablet Take 1 tablet by mouth daily.    . Omega-3 Fatty Acids (FISH OIL PO) Take 1 capsule by mouth daily.     No current facility-administered medications for this visit.      Physical Exam  Blood pressure 111/68, pulse 67, height 5' 7.5" (1.715 m), weight 160 lb (72.6 kg).  Constitutional: overall normal hygiene, normal nutrition, well developed, normal grooming, normal body habitus. Assistive device:none  Musculoskeletal: gait and station Limp none, muscle tone and strength are normal, no tremors or atrophy is present.  .  Neurological: coordination overall normal.  Deep tendon reflex/nerve stretch intact.  Sensation normal.  Cranial nerves II-XII intact.   Skin:   Normal overall no scars, lesions, ulcers or rashes. No psoriasis.  Psychiatric: Alert and oriented x 3.  Recent memory intact, remote memory unclear.  Normal mood and affect. Well groomed.  Good eye contact.  Cardiovascular: overall no swelling, no varicosities, no edema bilaterally, normal temperatures of the legs and arms, no clubbing, cyanosis and good capillary refill.  Lymphatic: palpation is normal.  Spine/Pelvis examination:  Inspection:  Overall, sacoiliac joint benign and hips nontender; without crepitus or defects.   Thoracic spine inspection: Alignment normal without kyphosis present   Lumbar spine inspection:  Alignment  with normal lumbar lordosis, without scoliosis apparent.   Thoracic spine palpation:  without tenderness of spinal processes   Lumbar spine palpation: without tenderness of lumbar area; without tightness of lumbar muscles    Range of Motion:   Lumbar flexion, forward flexion is normal without pain or tenderness    Lumbar extension is full without pain or  tenderness   Left lateral bend is normal without pain or tenderness   Right lateral bend is normal without pain or tenderness   Straight leg raising is normal  Strength & tone: normal   Stability overall normal stability  All other systems reviewed and are negative   The patient has been educated about the nature of the problem(s) and counseled on treatment options.  The patient appeared to understand what I have discussed and is in agreement with it.  Encounter Diagnosis  Name Primary?  . Chronic midline low back pain without sciatica Yes    PLAN Call if any problems.  Precautions discussed.  Continue current medications.   Return to clinic 3 months   I have reviewed the [place] web site prior to prescribing narcotic medicine for this patient.   Electronically Signed Sanjuana Kava, MD 2/18/20208:17 AM

## 2018-06-30 ENCOUNTER — Other Ambulatory Visit: Payer: Self-pay

## 2018-06-30 MED ORDER — HYDROCODONE-ACETAMINOPHEN 7.5-325 MG PO TABS: 1.0000 | ORAL_TABLET | Freq: Four times a day (QID) | ORAL | 0 refills | Status: DC | PRN

## 2018-07-28 ENCOUNTER — Other Ambulatory Visit: Payer: Self-pay

## 2018-07-28 MED ORDER — HYDROCODONE-ACETAMINOPHEN 7.5-325 MG PO TABS: 1.0000 | ORAL_TABLET | Freq: Four times a day (QID) | ORAL | 0 refills | Status: DC | PRN

## 2018-09-01 ENCOUNTER — Encounter: Payer: Self-pay | Admitting: Orthopaedic Surgery

## 2018-09-01 ENCOUNTER — Ambulatory Visit: Payer: Medicaid Other | Admitting: Orthopaedic Surgery

## 2018-09-01 ENCOUNTER — Other Ambulatory Visit: Payer: Self-pay

## 2018-09-01 VITALS — BP 115/67 | HR 64 | Temp 97.7°F | Ht 67.5 in | Wt 160.0 lb

## 2018-09-01 DIAGNOSIS — G8929 Other chronic pain: Secondary | ICD-10-CM

## 2018-09-01 DIAGNOSIS — M545 Low back pain: Secondary | ICD-10-CM

## 2018-09-01 DIAGNOSIS — F17210 Nicotine dependence, cigarettes, uncomplicated: Secondary | ICD-10-CM | POA: Diagnosis not present

## 2018-09-01 MED ORDER — HYDROCODONE-ACETAMINOPHEN 7.5-325 MG PO TABS: 1.0000 | ORAL_TABLET | Freq: Four times a day (QID) | ORAL | 0 refills | Status: DC | PRN

## 2018-09-01 NOTE — Progress Notes (Signed)
[name], female DOB:12-08-1965, 53 y.o. BJS:[id number]  Chief Complaint  Patient presents with  . Back Pain    HPI  Kaylee Ochoa is a 53 y.o. female who has chronic lower back pain with no sciatica.  She has good and bad days. She has no weakness, no numbness.  She is doing exercises and walking. She continues to smoke.   Body mass index is 24.69 kg/m.  ROS  Review of Systems  Constitutional:       Patient does not have Diabetes Mellitus. Patient has hypertension. Patient has COPD or shortness of breath. Patient does not have BMI > 35. Patient has current smoking history.  HENT: Negative for congestion.   Respiratory: Positive for cough and shortness of breath.   Cardiovascular: Negative for chest pain.  Endocrine: Positive for cold intolerance.  Musculoskeletal: Positive for arthralgias and back pain.  Allergic/Immunologic: Positive for environmental allergies.  All other systems reviewed and are negative.   All other systems reviewed and are negative.  The following is a summary of the past history medically, past history surgically, known current medicines, social history and family history.  This information is gathered electronically by the computer from prior information and documentation.  I review this each visit and have found including this information at this point in the chart is beneficial and informative.    Past Medical History:  Diagnosis Date  . Depression   . ETOH abuse   . Hypertension   . Renal disorder     Past Surgical History:  Procedure Laterality Date  . BREAST SURGERY    . cyst removal on breast    . DILITATION & CURRETTAGE/HYSTROSCOPY WITH NOVASURE ABLATION N/A 02/27/2016   Procedure: DILATATION & CURETTAGE/HYSTEROSCOPY  Procedure #2;  Surgeon: [name], MD;  Location: [facility];  Service: Gynecology;  Laterality: N/A;  . LAPAROSCOPIC BILATERAL SALPINGECTOMY N/A 02/27/2016   Procedure: LAPAROSCOPIC BILATERAL  SALPINGECTOMY; LAPAROSCOPIC LYSIS OF ADHESIONS Procedure #1;  Surgeon: [name], MD;  Location: [facility];  Service: Gynecology;  Laterality: N/A;    Family History  Problem Relation Age of Onset  . Muscular dystrophy Mother   . Other Father        died in house fire  . Cancer Sister        esophageal  . Cancer Maternal Grandmother   . Heart attack Maternal Grandfather     Social History Social History   Tobacco Use  . Smoking status: Current Every Day Smoker    Packs/day: 0.25    Years: 20.00    Pack years: 5.00    Types: Cigarettes  . Smokeless tobacco: Never Used  Substance Use Topics  . Alcohol use: No    Comment: stopped July 2016  . Drug use: No    Allergies  Allergen Reactions  . Other Hives, Itching and Rash    UNKNOWN ANTIBIOTIC: Patient cannot recall name of the medication but it casued a rash all over the body, hives, and itching  . Sulfa Antibiotics Hives    Current Outpatient Medications  Medication Sig Dispense Refill  . albuterol (PROVENTIL HFA;VENTOLIN HFA) 108 (90 BASE) MCG/ACT inhaler Inhale 2 puffs into the lungs every 6 (six) hours as needed for wheezing or shortness of breath. 1 Inhaler 0  . busPIRone (BUSPAR) 10 MG tablet Take 10 mg by mouth 3 (three) times daily.  2  . HYDROcodone-acetaminophen (NORCO) 7.5-325 MG tablet Take 1 tablet by mouth every 6 (six) hours as  needed for moderate pain (Must last 30 days). 52 tablet 0  . levothyroxine (SYNTHROID, LEVOTHROID) 50 MCG tablet Take 50 mcg by mouth daily.  1  . lisinopril (PRINIVIL,ZESTRIL) 40 MG tablet Take 40 mg by mouth daily.  6  . loratadine (CLARITIN) 10 MG tablet Take 10 mg by mouth daily.    Marland Kitchen lovastatin (MEVACOR) 20 MG tablet   6  . mirtazapine (REMERON) 15 MG tablet Take 15 mg by mouth at bedtime.  2  . Multiple Vitamin (MULTIVITAMIN WITH MINERALS) TABS tablet Take 1 tablet by mouth daily.    . naproxen (NAPROSYN) 500 MG tablet TAKE 1 TABLET BY MOUTH TWICE DAILY AS NEEDED FOR  MENSTRUAL CRAMPS  5  . nicotine (NICODERM CQ - DOSED IN MG/24 HOURS) 21 mg/24hr patch Place 1 patch (21 mg total) onto the skin daily. 28 patch 0  . norethindrone (MICRONOR,CAMILA,ERRIN) 0.35 MG tablet Take 1 tablet by mouth daily.    . Omega-3 Fatty Acids (FISH OIL PO) Take 1 capsule by mouth daily.     No current facility-administered medications for this visit.      Physical Exam  Blood pressure 115/67, pulse 64, temperature 97.7 F (36.5 C), height 5' 7.5" (1.715 m), weight 160 lb (72.6 kg).  Constitutional: overall normal hygiene, normal nutrition, well developed, normal grooming, normal body habitus. Assistive device:none  Musculoskeletal: gait and station Limp none, muscle tone and strength are normal, no tremors or atrophy is present.  .  Neurological: coordination overall normal.  Deep tendon reflex/nerve stretch intact.  Sensation normal.  Cranial nerves II-XII intact.   Skin:   Normal overall no scars, lesions, ulcers or rashes. No psoriasis.  Psychiatric: Alert and oriented x 3.  Recent memory intact, remote memory unclear.  Normal mood and affect. Well groomed.  Good eye contact.  Cardiovascular: overall no swelling, no varicosities, no edema bilaterally, normal temperatures of the legs and arms, no clubbing, cyanosis and good capillary refill.  Lymphatic: palpation is normal.  Spine/Pelvis examination:  Inspection:  Overall, sacoiliac joint benign and hips nontender; without crepitus or defects.   Thoracic spine inspection: Alignment normal without kyphosis present   Lumbar spine inspection:  Alignment  with normal lumbar lordosis, without scoliosis apparent.   Thoracic spine palpation:  without tenderness of spinal processes   Lumbar spine palpation: without tenderness of lumbar area; without tightness of lumbar muscles    Range of Motion:   Lumbar flexion, forward flexion is normal without pain or tenderness    Lumbar extension is full without pain or  tenderness   Left lateral bend is normal without pain or tenderness   Right lateral bend is normal without pain or tenderness   Straight leg raising is normal  Strength & tone: normal   Stability overall normal stability  All other systems reviewed and are negative   The patient has been educated about the nature of the problem(s) and counseled on treatment options.  The patient appeared to understand what I have discussed and is in agreement with it.  Encounter Diagnoses  Name Primary?  . Chronic midline low back pain without sciatica Yes  . Cigarette nicotine dependence without complication     PLAN Call if any problems.  Precautions discussed.  Continue current medications.   Return to clinic 3 months   The patient has read and signed an Opioid Treatment Agreement which has been scanned and added to the medical record.  The patient understands the agreement and agrees to abide with  it.  The patient has chronic pain that is being treated with an opioid which relieves the pain.  The patient understands potential complications with chronic opioid treatment.   I have reviewed the [place] web site prior to prescribing narcotic medicine for this patient.   Electronically Signed Sanjuana Kava, MD 5/19/20208:16 AM

## 2018-09-01 NOTE — Patient Instructions (Signed)
Steps to Quit Smoking    Smoking tobacco can be bad for your health. It can also affect almost every organ in your body. Smoking puts you and people around you at risk for many serious long-lasting (chronic) diseases. Quitting smoking is hard, but it is one of the best things that you can do for your health. It is never too late to quit.  What are the benefits of quitting smoking?  When you quit smoking, you lower your risk for getting serious diseases and conditions. They can include:  · Lung cancer or lung disease.  · Heart disease.  · Stroke.  · Heart attack.  · Not being able to have children (infertility).  · Weak bones (osteoporosis) and broken bones (fractures).  If you have coughing, wheezing, and shortness of breath, those symptoms may get better when you quit. You may also get sick less often. If you are pregnant, quitting smoking can help to lower your chances of having a baby of low birth weight.  What can I do to help me quit smoking?  Talk with your doctor about what can help you quit smoking. Some things you can do (strategies) include:  · Quitting smoking totally, instead of slowly cutting back how much you smoke over a period of time.  · Going to in-person counseling. You are more likely to quit if you go to many counseling sessions.  · Using resources and support systems, such as:  ? Online chats with a counselor.  ? Phone quitlines.  ? Printed self-help materials.  ? Support groups or group counseling.  ? Text messaging programs.  ? Mobile phone apps or applications.  · Taking medicines. Some of these medicines may have nicotine in them. If you are pregnant or breastfeeding, do not take any medicines to quit smoking unless your doctor says it is okay. Talk with your doctor about counseling or other things that can help you.  Talk with your doctor about using more than one strategy at the same time, such as taking medicines while you are also going to in-person counseling. This can help make  quitting easier.  What things can I do to make it easier to quit?  Quitting smoking might feel very hard at first, but there is a lot that you can do to make it easier. Take these steps:  · Talk to your family and friends. Ask them to support and encourage you.  · Call phone quitlines, reach out to support groups, or work with a counselor.  · Ask people who smoke to not smoke around you.  · Avoid places that make you want (trigger) to smoke, such as:  ? Bars.  ? Parties.  ? Smoke-break areas at work.  · Spend time with people who do not smoke.  · Lower the stress in your life. Stress can make you want to smoke. Try these things to help your stress:  ? Getting regular exercise.  ? Deep-breathing exercises.  ? Yoga.  ? Meditating.  ? Doing a body scan. To do this, close your eyes, focus on one area of your body at a time from head to toe, and notice which parts of your body are tense. Try to relax the muscles in those areas.  · Download or buy apps on your mobile phone or tablet that can help you stick to your quit plan. There are many free apps, such as QuitGuide from the CDC (Centers for Disease Control and Prevention). You can find more   support from smokefree.gov and other websites.  This information is not intended to replace advice given to you by your health care provider. Make sure you discuss any questions you have with your health care provider.  Document Released: 01/26/2009 Document Revised: 11/28/2015 Document Reviewed: 08/16/2014  [employer]vier Interactive Patient Education © 2019 [employer]

## 2018-09-11 ENCOUNTER — Telehealth: Payer: Self-pay | Admitting: Orthopaedic Surgery

## 2018-09-11 NOTE — Telephone Encounter (Signed)
Patient has called asking if the authorization has been sent in so she can get her pain medication. The form has been sent to [place] three different times per pharmacy. There is one in your box now, is there anything that can be done from our office to get the patient her medication authorized? This has been going on since her visit last week.  Please advise

## 2018-09-13 NOTE — Telephone Encounter (Signed)
I don't know anything about this.  What medicine?  She did fill a Rx for hydrocodone on 09-11-2018 written by me on 08-27-2018.  Let me know specifics.

## 2018-09-15 NOTE — Telephone Encounter (Signed)
1.  I do not do authorization forms for ANY narcotic, period.

## 2018-09-15 NOTE — Telephone Encounter (Signed)
Hydrocodone-Acetaminophen 7.5/325 mg  Qty 52 Tablets  PATIENT USES [facility] IN [place]  Patient did get 28 tablets when she got her medicine on 09/11/18. She is still needing authorization for Medicaid. She wants to get the rest of her prescription if possible. Her authorization form is in your box up front.

## 2018-09-28 ENCOUNTER — Other Ambulatory Visit: Payer: Self-pay

## 2018-09-28 MED ORDER — HYDROCODONE-ACETAMINOPHEN 7.5-325 MG PO TABS: 1.0000 | ORAL_TABLET | Freq: Four times a day (QID) | ORAL | 0 refills | Status: AC | PRN

## 2018-10-27 ENCOUNTER — Other Ambulatory Visit: Payer: Self-pay | Admitting: Radiology

## 2018-10-27 MED ORDER — HYDROCODONE-ACETAMINOPHEN 5-325 MG PO TABS: 1.0000 | ORAL_TABLET | Freq: Four times a day (QID) | ORAL | 0 refills | Status: DC | PRN

## 2018-11-26 ENCOUNTER — Other Ambulatory Visit: Payer: Self-pay

## 2018-11-26 ENCOUNTER — Ambulatory Visit: Payer: Medicaid Other | Admitting: Orthopaedic Surgery

## 2018-11-26 ENCOUNTER — Encounter: Payer: Self-pay | Admitting: Orthopaedic Surgery

## 2018-11-26 VITALS — BP 114/71 | HR 66 | Temp 99.0°F | Ht 67.0 in | Wt 147.4 lb

## 2018-11-26 DIAGNOSIS — M545 Low back pain: Secondary | ICD-10-CM | POA: Diagnosis not present

## 2018-11-26 DIAGNOSIS — F17210 Nicotine dependence, cigarettes, uncomplicated: Secondary | ICD-10-CM

## 2018-11-26 DIAGNOSIS — G8929 Other chronic pain: Secondary | ICD-10-CM | POA: Diagnosis not present

## 2018-11-26 MED ORDER — HYDROCODONE-ACETAMINOPHEN 5-325 MG PO TABS: 1.0000 | ORAL_TABLET | Freq: Four times a day (QID) | ORAL | 0 refills | Status: DC | PRN

## 2018-11-26 NOTE — Patient Instructions (Signed)

## 2018-11-26 NOTE — Progress Notes (Signed)
Kaylee Ochoa, female DOB:05-May-1965, 53 y.o. QQI:[id number]  Chief Complaint  Kaylee presents with  . Back Pain    aching     HPI  Kaylee Ochoa is a 53 y.o. female who has chronic lower back pain. She is doing her exercises. She has lost 15 pounds. She has pain most days. Some days are worse than others. She has no weakness.   Body mass index is 23.09 kg/m.  ROS  Review of Systems  Constitutional:       Kaylee does not have Diabetes Mellitus. Kaylee has hypertension. Kaylee has COPD or shortness of breath. Kaylee does not have BMI > 35. Kaylee has current smoking history.  HENT: Negative for congestion.   Respiratory: Positive for cough and shortness of breath.   Cardiovascular: Negative for chest pain.  Endocrine: Positive for cold intolerance.  Musculoskeletal: Positive for arthralgias and back pain.  Allergic/Immunologic: Positive for environmental allergies.  All other systems reviewed and are negative.   All other systems reviewed and are negative.  The following is a summary of the past history medically, past history surgically, known current medicines, social history and family history.  This information is gathered electronically by the computer from prior information and documentation.  I review this each visit and have found including this information at this point in the chart is beneficial and informative.    Past Medical History:  Diagnosis Date  . Depression   . ETOH abuse   . Hypertension   . Renal disorder     Past Surgical History:  Procedure Laterality Date  . BREAST SURGERY    . cyst removal on breast    . DILITATION & CURRETTAGE/HYSTROSCOPY WITH NOVASURE ABLATION N/A 02/27/2016   Procedure: DILATATION & CURETTAGE/HYSTEROSCOPY  Procedure #2;  Surgeon: [name], MD;  Location: [facility];  Service: Gynecology;  Laterality: N/A;  . LAPAROSCOPIC BILATERAL SALPINGECTOMY N/A 02/27/2016   Procedure: LAPAROSCOPIC BILATERAL  SALPINGECTOMY; LAPAROSCOPIC LYSIS OF ADHESIONS Procedure #1;  Surgeon: [name], MD;  Location: [facility];  Service: Gynecology;  Laterality: N/A;    Family History  Problem Relation Age of Onset  . Muscular dystrophy Mother   . Other Father        died in house fire  . Cancer Sister        esophageal  . Cancer Maternal Grandmother   . Heart attack Maternal Grandfather     Social History Social History   Tobacco Use  . Smoking status: Current Every Day Smoker    Packs/day: 0.25    Years: 20.00    Pack years: 5.00    Types: Cigarettes  . Smokeless tobacco: Never Used  Substance Use Topics  . Alcohol use: No    Comment: stopped July 2016  . Drug use: No    Allergies  Allergen Reactions  . Other Hives, Itching and Rash    UNKNOWN ANTIBIOTIC: Kaylee cannot recall name of the medication but it casued a rash all over the body, hives, and itching  . Sulfa Antibiotics Hives    Current Outpatient Medications  Medication Sig Dispense Refill  . albuterol (PROVENTIL HFA;VENTOLIN HFA) 108 (90 BASE) MCG/ACT inhaler Inhale 2 puffs into the lungs every 6 (six) hours as needed for wheezing or shortness of breath. 1 Inhaler 0  . busPIRone (BUSPAR) 10 MG tablet Take 10 mg by mouth 3 (three) times daily.  2  . HYDROcodone-acetaminophen (NORCO/VICODIN) 5-325 MG tablet Take 1 tablet by mouth every 6 (  six) hours as needed for moderate pain. 28 tablet 0  . levothyroxine (SYNTHROID, LEVOTHROID) 50 MCG tablet Take 50 mcg by mouth daily.  1  . lisinopril (PRINIVIL,ZESTRIL) 40 MG tablet Take 40 mg by mouth daily.  6  . loratadine (CLARITIN) 10 MG tablet Take 10 mg by mouth daily.    Marland Kitchen lovastatin (MEVACOR) 20 MG tablet   6  . mirtazapine (REMERON) 15 MG tablet Take 15 mg by mouth at bedtime.  2  . Multiple Vitamin (MULTIVITAMIN WITH MINERALS) TABS tablet Take 1 tablet by mouth daily.    . naproxen (NAPROSYN) 500 MG tablet TAKE 1 TABLET BY MOUTH TWICE DAILY AS NEEDED FOR MENSTRUAL CRAMPS  5   . nicotine (NICODERM CQ - DOSED IN MG/24 HOURS) 21 mg/24hr patch Place 1 patch (21 mg total) onto the skin daily. 28 patch 0  . norethindrone (MICRONOR,CAMILA,ERRIN) 0.35 MG tablet Take 1 tablet by mouth daily.    . Omega-3 Fatty Acids (FISH OIL PO) Take 1 capsule by mouth daily.     No current facility-administered medications for this visit.      Physical Exam  Blood pressure 114/71, pulse 66, temperature 99 F (37.2 C), height 5\' 7"  (1.702 m), weight 147 lb 7 oz (66.9 kg).  Constitutional: overall normal hygiene, normal nutrition, well developed, normal grooming, normal body habitus. Assistive device:none  Musculoskeletal: gait and station Limp none, muscle tone and strength are normal, no tremors or atrophy is present.  .  Neurological: coordination overall normal.  Deep tendon reflex/nerve stretch intact.  Sensation normal.  Cranial nerves II-XII intact.   Skin:   Normal overall no scars, lesions, ulcers or rashes. No psoriasis.  Psychiatric: Alert and oriented x 3.  Recent memory intact, remote memory unclear.  Normal mood and affect. Well groomed.  Good eye contact.  Cardiovascular: overall no swelling, no varicosities, no edema bilaterally, normal temperatures of the legs and arms, no clubbing, cyanosis and good capillary refill.  Lymphatic: palpation is normal.  Spine/Pelvis examination:  Inspection:  Overall, sacoiliac joint benign and hips nontender; without crepitus or defects.   Thoracic spine inspection: Alignment normal without kyphosis present   Lumbar spine inspection:  Alignment  with normal lumbar lordosis, without scoliosis apparent.   Thoracic spine palpation:  without tenderness of spinal processes   Lumbar spine palpation: without tenderness of lumbar area; without tightness of lumbar muscles    Range of Motion:   Lumbar flexion, forward flexion is normal without pain or tenderness    Lumbar extension is full without pain or tenderness   Left lateral  bend is normal without pain or tenderness   Right lateral bend is normal without pain or tenderness   Straight leg raising is normal  Strength & tone: normal   Stability overall normal stability  All other systems reviewed and are negative   The Kaylee has been educated about the nature of the problem(s) and counseled on treatment options.  The Kaylee appeared to understand what I have discussed and is in agreement with it.  Encounter Diagnoses  Name Primary?  . Chronic midline low back pain without sciatica Yes  . Cigarette nicotine dependence without complication     PLAN Call if any problems.  Precautions discussed.  Continue current medications.   Return to clinic 3 months   I have reviewed the [place] web site prior to prescribing narcotic medicine for this Kaylee.   Electronically Signed Sanjuana Kava, MD 8/13/20209:44 AM

## 2018-12-01 ENCOUNTER — Ambulatory Visit: Payer: Medicaid Other | Admitting: Orthopaedic Surgery

## 2018-12-24 ENCOUNTER — Other Ambulatory Visit: Payer: Self-pay

## 2018-12-25 MED ORDER — HYDROCODONE-ACETAMINOPHEN 5-325 MG PO TABS: 1.0000 | ORAL_TABLET | Freq: Four times a day (QID) | ORAL | 0 refills | Status: DC | PRN

## 2019-01-21 ENCOUNTER — Other Ambulatory Visit: Payer: Self-pay

## 2019-01-22 ENCOUNTER — Other Ambulatory Visit: Payer: Self-pay

## 2019-01-26 MED ORDER — HYDROCODONE-ACETAMINOPHEN 5-325 MG PO TABS: 1.0000 | ORAL_TABLET | Freq: Four times a day (QID) | ORAL | 0 refills | Status: DC | PRN

## 2019-02-25 ENCOUNTER — Encounter: Payer: Self-pay | Admitting: Orthopaedic Surgery

## 2019-02-25 ENCOUNTER — Ambulatory Visit: Payer: Medicaid Other | Admitting: Orthopaedic Surgery

## 2019-02-25 ENCOUNTER — Other Ambulatory Visit: Payer: Self-pay

## 2019-02-25 ENCOUNTER — Ambulatory Visit (INDEPENDENT_AMBULATORY_CARE_PROVIDER_SITE_OTHER): Payer: Medicaid Other | Admitting: Orthopaedic Surgery

## 2019-02-25 VITALS — BP 109/72 | HR 71 | Temp 97.5°F | Ht 67.0 in | Wt 148.4 lb

## 2019-02-25 DIAGNOSIS — M545 Low back pain: Secondary | ICD-10-CM

## 2019-02-25 DIAGNOSIS — F17210 Nicotine dependence, cigarettes, uncomplicated: Secondary | ICD-10-CM | POA: Diagnosis not present

## 2019-02-25 DIAGNOSIS — G8929 Other chronic pain: Secondary | ICD-10-CM

## 2019-02-25 MED ORDER — HYDROCODONE-ACETAMINOPHEN 5-325 MG PO TABS: 1.0000 | ORAL_TABLET | Freq: Four times a day (QID) | ORAL | 0 refills | Status: DC | PRN

## 2019-02-25 NOTE — Progress Notes (Signed)
[name] E Wieczorek, female DOB:[birth year], 53 y.o. [id number]  Chief Complaint  Patient presents with  . Back Pain    its the same    HPI  Kaylee Ochoa is a 53 y.o. female who has chronic lower back pain. She has more pain with increased activity.  She has no new trauma, no weakness, no numbness.  She is doing her exercises and taking her medicine.   Body mass index is 23.24 kg/m.  ROS  Review of Systems  Constitutional:       Patient does not have Diabetes Mellitus. Patient has hypertension. Patient has COPD or shortness of breath. Patient does not have BMI > 35. Patient has current smoking history.  HENT: Negative for congestion.   Respiratory: Positive for cough and shortness of breath.   Cardiovascular: Negative for chest pain.  Endocrine: Positive for cold intolerance.  Musculoskeletal: Positive for arthralgias and back pain.  Allergic/Immunologic: Positive for environmental allergies.  All other systems reviewed and are negative.   All other systems reviewed and are negative.  The following is a summary of the past history medically, past history surgically, known current medicines, social history and family history.  This information is gathered electronically by the computer from prior information and documentation.  I review this each visit and have found including this information at this point in the chart is beneficial and informative.    Past Medical History:  Diagnosis Date  . Depression   . ETOH abuse   . Hypertension   . Renal disorder     Past Surgical History:  Procedure Laterality Date  . BREAST SURGERY    . cyst removal on breast    . DILITATION & CURRETTAGE/HYSTROSCOPY WITH NOVASURE ABLATION N/A 02/27/2016   Procedure: DILATATION & CURETTAGE/HYSTEROSCOPY  Procedure #2;  Surgeon: [name], MD;  Location: [facility];  Service: Gynecology;  Laterality: N/A;  . LAPAROSCOPIC BILATERAL SALPINGECTOMY N/A 02/27/2016   Procedure:  LAPAROSCOPIC BILATERAL SALPINGECTOMY; LAPAROSCOPIC LYSIS OF ADHESIONS Procedure #1;  Surgeon: [name], MD;  Location: [facility];  Service: Gynecology;  Laterality: N/A;    Family History  Problem Relation Age of Onset  . Muscular dystrophy Mother   . Other Father        died in house fire  . Cancer Sister        esophageal  . Cancer Maternal Grandmother   . Heart attack Maternal Grandfather     Social History Social History   Tobacco Use  . Smoking status: Current Every Day Smoker    Packs/day: 0.25    Years: 20.00    Pack years: 5.00    Types: Cigarettes  . Smokeless tobacco: Never Used  Substance Use Topics  . Alcohol use: No    Comment: stopped July 2016  . Drug use: No    Allergies  Allergen Reactions  . Other Hives, Itching and Rash    UNKNOWN ANTIBIOTIC: Patient cannot recall name of the medication but it casued a rash all over the body, hives, and itching  . Sulfa Antibiotics Hives    Current Outpatient Medications  Medication Sig Dispense Refill  . albuterol (PROVENTIL HFA;VENTOLIN HFA) 108 (90 BASE) MCG/ACT inhaler Inhale 2 puffs into the lungs every 6 (six) hours as needed for wheezing or shortness of breath. 1 Inhaler 0  . busPIRone (BUSPAR) 10 MG tablet Take 10 mg by mouth 3 (three) times daily.  2  . HYDROcodone-acetaminophen (NORCO/VICODIN) 5-325 MG tablet Take 1 tablet by  mouth every 6 (six) hours as needed for moderate pain. 25 tablet 0  . levothyroxine (SYNTHROID, LEVOTHROID) 50 MCG tablet Take 50 mcg by mouth daily.  1  . lisinopril (PRINIVIL,ZESTRIL) 40 MG tablet Take 40 mg by mouth daily.  6  . loratadine (CLARITIN) 10 MG tablet Take 10 mg by mouth daily.    Marland Kitchen lovastatin (MEVACOR) 20 MG tablet   6  . mirtazapine (REMERON) 15 MG tablet Take 15 mg by mouth at bedtime.  2  . Multiple Vitamin (MULTIVITAMIN WITH MINERALS) TABS tablet Take 1 tablet by mouth daily.    . naproxen (NAPROSYN) 500 MG tablet TAKE 1 TABLET BY MOUTH TWICE DAILY AS NEEDED FOR  MENSTRUAL CRAMPS  5  . nicotine (NICODERM CQ - DOSED IN MG/24 HOURS) 21 mg/24hr patch Place 1 patch (21 mg total) onto the skin daily. 28 patch 0  . norethindrone (MICRONOR,CAMILA,ERRIN) 0.35 MG tablet Take 1 tablet by mouth daily.    . Omega-3 Fatty Acids (FISH OIL PO) Take 1 capsule by mouth daily.     No current facility-administered medications for this visit.      Physical Exam  Blood pressure 109/72, pulse 71, temperature (!) 97.5 F (36.4 C), height 5\' 7"  (1.702 m), weight 148 lb 6 oz (67.3 kg).  Constitutional: overall normal hygiene, normal nutrition, well developed, normal grooming, normal body habitus. Assistive device:none  Musculoskeletal: gait and station Limp none, muscle tone and strength are normal, no tremors or atrophy is present.  .  Neurological: coordination overall normal.  Deep tendon reflex/nerve stretch intact.  Sensation normal.  Cranial nerves II-XII intact.   Skin:   Normal overall no scars, lesions, ulcers or rashes. No psoriasis.  Psychiatric: Alert and oriented x 3.  Recent memory intact, remote memory unclear.  Normal mood and affect. Well groomed.  Good eye contact.  Cardiovascular: overall no swelling, no varicosities, no edema bilaterally, normal temperatures of the legs and arms, no clubbing, cyanosis and good capillary refill.  Lymphatic: palpation is normal.  Spine/Pelvis examination:  Inspection:  Overall, sacoiliac joint benign and hips nontender; without crepitus or defects.   Thoracic spine inspection: Alignment normal without kyphosis present   Lumbar spine inspection:  Alignment  with normal lumbar lordosis, without scoliosis apparent.   Thoracic spine palpation:  without tenderness of spinal processes   Lumbar spine palpation: without tenderness of lumbar area; without tightness of lumbar muscles    Range of Motion:   Lumbar flexion, forward flexion is normal without pain or tenderness    Lumbar extension is full without pain or  tenderness   Left lateral bend is normal without pain or tenderness   Right lateral bend is normal without pain or tenderness   Straight leg raising is normal  Strength & tone: normal   Stability overall normal stability  All other systems reviewed and are negative   The patient has been educated about the nature of the problem(s) and counseled on treatment options.  The patient appeared to understand what I have discussed and is in agreement with it.  Encounter Diagnoses  Name Primary?  . Chronic midline low back pain without sciatica Yes  . Cigarette nicotine dependence without complication     PLAN Call if any problems.  Precautions discussed.  Continue current medications.   Return to clinic 3 months   I have reviewed the [facility] web site prior to prescribing narcotic medicine for this patient.   Electronically H[place],  MD 11/12/20208:43 AM

## 2019-02-25 NOTE — Patient Instructions (Signed)

## 2019-03-04 ENCOUNTER — Ambulatory Visit: Payer: Medicaid Other | Admitting: Orthopaedic Surgery

## 2019-03-29 ENCOUNTER — Other Ambulatory Visit: Payer: Self-pay

## 2019-03-29 MED ORDER — HYDROCODONE-ACETAMINOPHEN 5-325 MG PO TABS: 1.0000 | ORAL_TABLET | Freq: Four times a day (QID) | ORAL | 0 refills | Status: DC | PRN

## 2019-04-26 ENCOUNTER — Other Ambulatory Visit: Payer: Self-pay

## 2019-04-26 MED ORDER — HYDROCODONE-ACETAMINOPHEN 5-325 MG PO TABS: 1.0000 | ORAL_TABLET | Freq: Four times a day (QID) | ORAL | 0 refills | Status: DC | PRN

## 2019-05-27 ENCOUNTER — Ambulatory Visit: Payer: Medicaid Other | Admitting: Orthopaedic Surgery

## 2019-05-27 ENCOUNTER — Other Ambulatory Visit: Payer: Self-pay

## 2019-05-27 ENCOUNTER — Encounter: Payer: Self-pay | Admitting: Orthopaedic Surgery

## 2019-05-27 VITALS — BP 97/59 | HR 73 | Ht 67.0 in | Wt 140.0 lb

## 2019-05-27 DIAGNOSIS — F17210 Nicotine dependence, cigarettes, uncomplicated: Secondary | ICD-10-CM | POA: Diagnosis not present

## 2019-05-27 DIAGNOSIS — M545 Low back pain: Secondary | ICD-10-CM | POA: Diagnosis not present

## 2019-05-27 DIAGNOSIS — G8929 Other chronic pain: Secondary | ICD-10-CM | POA: Diagnosis not present

## 2019-05-27 MED ORDER — HYDROCODONE-ACETAMINOPHEN 5-325 MG PO TABS: 1.0000 | ORAL_TABLET | Freq: Four times a day (QID) | ORAL | 0 refills | Status: DC | PRN

## 2019-05-27 NOTE — Progress Notes (Signed)
Patient [id number] Kaylee Ochoa, female DOB:[birth year], 54 y.o. [id number]  Chief Complaint  Patient presents with  . Back Pain    HPI  Kaylee Ochoa is a 54 y.o. female who has chronic lower back pain.  She has more pain in the cold weather.  She has no radiation.  She has no weakness or new trauma.  She is taking her medicine and doing her exercises.   Body mass index is 21.93 kg/m.  ROS  Review of Systems  Constitutional:       Patient does not have Diabetes Mellitus. Patient has hypertension. Patient has COPD or shortness of breath. Patient does not have BMI > 35. Patient has current smoking history.  HENT: Negative for congestion.   Respiratory: Positive for cough and shortness of breath.   Cardiovascular: Negative for chest pain.  Endocrine: Positive for cold intolerance.  Musculoskeletal: Positive for arthralgias and back pain.  Allergic/Immunologic: Positive for environmental allergies.  All other systems reviewed and are negative.   All other systems reviewed and are negative.  The following is a summary of the past history medically, past history surgically, known current medicines, social history and family history.  This information is gathered electronically by the computer from prior information and documentation.  I review this each visit and have found including this information at this point in the chart is beneficial and informative.    Past Medical History:  Diagnosis Date  . Depression   . ETOH abuse   . Hypertension   . Renal disorder     Past Surgical History:  Procedure Laterality Date  . BREAST SURGERY    . cyst removal on breast    . DILITATION & CURRETTAGE/HYSTROSCOPY WITH NOVASURE ABLATION N/A 02/27/2016   Procedure: DILATATION & CURETTAGE/HYSTEROSCOPY  Procedure #2;  Surgeon: [name], MD;  Location: [facility];  Service: Gynecology;  Laterality: N/A;  . LAPAROSCOPIC BILATERAL SALPINGECTOMY N/A 02/27/2016   Procedure: LAPAROSCOPIC  BILATERAL SALPINGECTOMY; LAPAROSCOPIC LYSIS OF ADHESIONS Procedure #1;  Surgeon: [name], MD;  Location: [facility];  Service: Gynecology;  Laterality: N/A;    Family History  Problem Relation Age of Onset  . Muscular dystrophy Mother   . Other Father        died in house fire  . Cancer Sister        esophageal  . Cancer Maternal Grandmother   . Heart attack Maternal Grandfather     Social History Social History   Tobacco Use  . Smoking status: Current Every Day Smoker    Packs/day: 0.25    Years: 20.00    Pack years: 5.00    Types: Cigarettes  . Smokeless tobacco: Never Used  Substance Use Topics  . Alcohol use: No    Comment: stopped July 2016  . Drug use: No    Allergies  Allergen Reactions  . Other Hives, Itching and Rash    UNKNOWN ANTIBIOTIC: Patient cannot recall name of the medication but it casued a rash all over the body, hives, and itching  . Sulfa Antibiotics Hives    Current Outpatient Medications  Medication Sig Dispense Refill  . albuterol (PROVENTIL HFA;VENTOLIN HFA) 108 (90 BASE) MCG/ACT inhaler Inhale 2 puffs into the lungs every 6 (six) hours as needed for wheezing or shortness of breath. 1 Inhaler 0  . busPIRone (BUSPAR) 10 MG tablet Take 10 mg by mouth 3 (three) times daily.  2  . HYDROcodone-acetaminophen (NORCO/VICODIN) 5-325 MG tablet Take 1 tablet by mouth  every 6 (six) hours as needed for moderate pain. 30 tablet 0  . levothyroxine (SYNTHROID, LEVOTHROID) 50 MCG tablet Take 50 mcg by mouth daily.  1  . lisinopril (PRINIVIL,ZESTRIL) 40 MG tablet Take 40 mg by mouth daily.  6  . loratadine (CLARITIN) 10 MG tablet Take 10 mg by mouth daily.    Marland Kitchen lovastatin (MEVACOR) 20 MG tablet   6  . mirtazapine (REMERON) 15 MG tablet Take 15 mg by mouth at bedtime.  2  . Multiple Vitamin (MULTIVITAMIN WITH MINERALS) TABS tablet Take 1 tablet by mouth daily.    . naproxen (NAPROSYN) 500 MG tablet TAKE 1 TABLET BY MOUTH TWICE DAILY AS NEEDED FOR MENSTRUAL  CRAMPS  5  . nicotine (NICODERM CQ - DOSED IN MG/24 HOURS) 21 mg/24hr patch Place 1 patch (21 mg total) onto the skin daily. 28 patch 0  . norethindrone (MICRONOR,CAMILA,ERRIN) 0.35 MG tablet Take 1 tablet by mouth daily.    . Omega-3 Fatty Acids (FISH OIL PO) Take 1 capsule by mouth daily.     No current facility-administered medications for this visit.     Physical Exam  Blood pressure (!) 97/59, pulse 73, height 5\' 7"  (1.702 m), weight 140 lb (63.5 kg).  Constitutional: overall normal hygiene, normal nutrition, well developed, normal grooming, normal body habitus. Assistive device:none  Musculoskeletal: gait and station Limp none, muscle tone and strength are normal, no tremors or atrophy is present.  .  Neurological: coordination overall normal.  Deep tendon reflex/nerve stretch intact.  Sensation normal.  Cranial nerves II-XII intact.   Skin:   Normal overall no scars, lesions, ulcers or rashes. No psoriasis.  Psychiatric: Alert and oriented x 3.  Recent memory intact, remote memory unclear.  Normal mood and affect. Well groomed.  Good eye contact.  Cardiovascular: overall no swelling, no varicosities, no edema bilaterally, normal temperatures of the legs and arms, no clubbing, cyanosis and good capillary refill.  Spine/Pelvis examination:  Inspection:  Overall, sacoiliac joint benign and hips nontender; without crepitus or defects.   Thoracic spine inspection: Alignment normal without kyphosis present   Lumbar spine inspection:  Alignment  with normal lumbar lordosis, without scoliosis apparent.   Thoracic spine palpation:  without tenderness of spinal processes   Lumbar spine palpation: without tenderness of lumbar area; without tightness of lumbar muscles    Range of Motion:   Lumbar flexion, forward flexion is normal without pain or tenderness    Lumbar extension is full without pain or tenderness   Left lateral bend is normal without pain or tenderness   Right  lateral bend is normal without pain or tenderness   Straight leg raising is normal  Strength & tone: normal   Stability overall normal stability  Lymphatic: palpation is normal.  All other systems reviewed and are negative   The patient has been educated about the nature of the problem(s) and counseled on treatment options.  The patient appeared to understand what I have discussed and is in agreement with it.  Encounter Diagnoses  Name Primary?  . Chronic midline low back pain without sciatica Yes  . Cigarette nicotine dependence without complication     PLAN Call if any problems.  Precautions discussed.  Continue current medications.   Return to clinic 3 months   Electronically Signed Sanjuana Kava, MD 2/11/20218:29 AM

## 2019-06-23 ENCOUNTER — Other Ambulatory Visit: Payer: Self-pay | Admitting: Orthopaedic Surgery

## 2019-06-23 MED ORDER — HYDROCODONE-ACETAMINOPHEN 5-325 MG PO TABS: 1.0000 | ORAL_TABLET | Freq: Four times a day (QID) | ORAL | 0 refills | Status: DC | PRN

## 2019-07-22 ENCOUNTER — Other Ambulatory Visit: Payer: Self-pay | Admitting: Orthopaedic Surgery

## 2019-07-22 MED ORDER — HYDROCODONE-ACETAMINOPHEN 5-325 MG PO TABS: 1.0000 | ORAL_TABLET | Freq: Four times a day (QID) | ORAL | 0 refills | Status: DC | PRN

## 2019-08-26 ENCOUNTER — Ambulatory Visit (INDEPENDENT_AMBULATORY_CARE_PROVIDER_SITE_OTHER): Payer: Medicaid Other | Admitting: Orthopaedic Surgery

## 2019-08-26 ENCOUNTER — Other Ambulatory Visit: Payer: Self-pay

## 2019-08-26 ENCOUNTER — Encounter: Payer: Self-pay | Admitting: Orthopaedic Surgery

## 2019-08-26 VITALS — BP 113/66 | HR 73 | Ht 67.0 in | Wt 147.8 lb

## 2019-08-26 DIAGNOSIS — G8929 Other chronic pain: Secondary | ICD-10-CM

## 2019-08-26 DIAGNOSIS — F17210 Nicotine dependence, cigarettes, uncomplicated: Secondary | ICD-10-CM

## 2019-08-26 DIAGNOSIS — M545 Low back pain, unspecified: Secondary | ICD-10-CM

## 2019-08-26 MED ORDER — HYDROCODONE-ACETAMINOPHEN 5-325 MG PO TABS: 1.0000 | ORAL_TABLET | Freq: Four times a day (QID) | ORAL | 0 refills | Status: DC | PRN

## 2019-08-26 NOTE — Progress Notes (Signed)
[name], female DOB:1965-06-03, 54 y.o. [id number]  Chief Complaint  Patient presents with  . Back Pain    3 month follow up    HPI  Kaylee Ochoa is a 54 y.o. female who has lower back pain that is chronic.  She has good and bad days. She has no weakness, no new trauma. She is taking her medicine and doing her exercises.  She had shingles recently affecting her face on the right upper.  She is better now and has no lesions.   Body mass index is 23.15 kg/m.  ROS  Review of Systems  Constitutional:       Patient does not have Diabetes Mellitus. Patient has hypertension. Patient has COPD or shortness of breath. Patient does not have BMI > 35. Patient has current smoking history.  HENT: Negative for congestion.   Respiratory: Positive for cough and shortness of breath.   Cardiovascular: Negative for chest pain.  Endocrine: Positive for cold intolerance.  Musculoskeletal: Positive for arthralgias and back pain.  Allergic/Immunologic: Positive for environmental allergies.  All other systems reviewed and are negative.   All other systems reviewed and are negative.  The following is a summary of the past history medically, past history surgically, known current medicines, social history and family history.  This information is gathered electronically by the computer from prior information and documentation.  I review this each visit and have found including this information at this point in the chart is beneficial and informative.    Past Medical History:  Diagnosis Date  . Depression   . ETOH abuse   . Hypertension   . Renal disorder     Past Surgical History:  Procedure Laterality Date  . BREAST SURGERY    . cyst removal on breast    . DILITATION & CURRETTAGE/HYSTROSCOPY WITH NOVASURE ABLATION N/A 02/27/2016   Procedure: DILATATION & CURETTAGE/HYSTEROSCOPY  Procedure #2;  Surgeon: [name], MD;  Location: [facility];  Service: Gynecology;   Laterality: N/A;  . LAPAROSCOPIC BILATERAL SALPINGECTOMY N/A 02/27/2016   Procedure: LAPAROSCOPIC BILATERAL SALPINGECTOMY; LAPAROSCOPIC LYSIS OF ADHESIONS Procedure #1;  Surgeon: [name], MD;  Location: [facility];  Service: Gynecology;  Laterality: N/A;    Family History  Problem Relation Age of Onset  . Muscular dystrophy Mother   . Other Father        died in house fire  . Cancer Sister        esophageal  . Cancer Maternal Grandmother   . Heart attack Maternal Grandfather     Social History Social History   Tobacco Use  . Smoking status: Current Every Day Smoker    Packs/day: 0.25    Years: 20.00    Pack years: 5.00    Types: Cigarettes  . Smokeless tobacco: Never Used  Substance Use Topics  . Alcohol use: No    Comment: stopped July 2016  . Drug use: No    Allergies  Allergen Reactions  . Other Hives, Itching and Rash    UNKNOWN ANTIBIOTIC: Patient cannot recall name of the medication but it casued a rash all over the body, hives, and itching  . Sulfa Antibiotics Hives    Current Outpatient Medications  Medication Sig Dispense Refill  . albuterol (PROVENTIL HFA;VENTOLIN HFA) 108 (90 BASE) MCG/ACT inhaler Inhale 2 puffs into the lungs every 6 (six) hours as needed for wheezing or shortness of breath. 1 Inhaler 0  . busPIRone (BUSPAR) 10 MG tablet Take 10  mg by mouth 3 (three) times daily.  2  . HYDROcodone-acetaminophen (NORCO/VICODIN) 5-325 MG tablet Take 1 tablet by mouth every 6 (six) hours as needed for moderate pain. 25 tablet 0  . levothyroxine (SYNTHROID, LEVOTHROID) 50 MCG tablet Take 50 mcg by mouth daily.  1  . lisinopril (PRINIVIL,ZESTRIL) 40 MG tablet Take 40 mg by mouth daily.  6  . loratadine (CLARITIN) 10 MG tablet Take 10 mg by mouth daily.    Marland Kitchen lovastatin (MEVACOR) 20 MG tablet   6  . mirtazapine (REMERON) 15 MG tablet Take 15 mg by mouth at bedtime.  2  . Multiple Vitamin (MULTIVITAMIN WITH MINERALS) TABS tablet Take 1 tablet by mouth daily.     . naproxen (NAPROSYN) 500 MG tablet TAKE 1 TABLET BY MOUTH TWICE DAILY AS NEEDED FOR MENSTRUAL CRAMPS  5  . nicotine (NICODERM CQ - DOSED IN MG/24 HOURS) 21 mg/24hr patch Place 1 patch (21 mg total) onto the skin daily. 28 patch 0  . norethindrone (MICRONOR,CAMILA,ERRIN) 0.35 MG tablet Take 1 tablet by mouth daily.    . Omega-3 Fatty Acids (FISH OIL PO) Take 1 capsule by mouth daily.     No current facility-administered medications for this visit.     Physical Exam  Blood pressure 113/66, pulse 73, height 5\' 7"  (1.702 m), weight 147 lb 12.8 oz (67 kg).  Constitutional: overall normal hygiene, normal nutrition, well developed, normal grooming, normal body habitus. Assistive device:none  Musculoskeletal: gait and station Limp none, muscle tone and strength are normal, no tremors or atrophy is present.  .  Neurological: coordination overall normal.  Deep tendon reflex/nerve stretch intact.  Sensation normal.  Cranial nerves II-XII intact.   Skin:   Normal overall no scars, lesions, ulcers or rashes. No psoriasis.  Psychiatric: Alert and oriented x 3.  Recent memory intact, remote memory unclear.  Normal mood and affect. Well groomed.  Good eye contact.  Cardiovascular: overall no swelling, no varicosities, no edema bilaterally, normal temperatures of the legs and arms, no clubbing, cyanosis and good capillary refill.  Lymphatic: palpation is normal.  Spine/Pelvis examination:  Inspection:  Overall, sacoiliac joint benign and hips nontender; without crepitus or defects.   Thoracic spine inspection: Alignment normal without kyphosis present   Lumbar spine inspection:  Alignment  with normal lumbar lordosis, without scoliosis apparent.   Thoracic spine palpation:  without tenderness of spinal processes   Lumbar spine palpation: without tenderness of lumbar area; without tightness of lumbar muscles    Range of Motion:   Lumbar flexion, forward flexion is normal without pain or  tenderness    Lumbar extension is full without pain or tenderness   Left lateral bend is normal without pain or tenderness   Right lateral bend is normal without pain or tenderness   Straight leg raising is normal  Strength & tone: normal   Stability overall normal stability  All other systems reviewed and are negative   The patient has been educated about the nature of the problem(s) and counseled on treatment options.  The patient appeared to understand what I have discussed and is in agreement with it.  Encounter Diagnoses  Name Primary?  . Chronic midline low back pain without sciatica Yes  . Cigarette nicotine dependence without complication     PLAN Call if any problems.  Precautions discussed.  Continue current medications.   Return to clinic 3 months   I have reviewed the [facility] web site prior  to prescribing narcotic medicine for this patient.   Electronically Signed Sanjuana Kava, MD 5/13/20218:51 AM

## 2019-09-23 ENCOUNTER — Other Ambulatory Visit: Payer: Self-pay | Admitting: Orthopaedic Surgery

## 2019-09-23 MED ORDER — HYDROCODONE-ACETAMINOPHEN 5-325 MG PO TABS: 1.0000 | ORAL_TABLET | Freq: Four times a day (QID) | ORAL | 0 refills | Status: DC | PRN

## 2019-10-25 ENCOUNTER — Other Ambulatory Visit: Payer: Self-pay | Admitting: Orthopaedic Surgery

## 2019-10-25 MED ORDER — HYDROCODONE-ACETAMINOPHEN 5-325 MG PO TABS: 1.0000 | ORAL_TABLET | Freq: Four times a day (QID) | ORAL | 0 refills | Status: DC | PRN

## 2019-11-18 ENCOUNTER — Ambulatory Visit: Payer: Medicaid Other | Admitting: Orthopaedic Surgery

## 2019-11-18 ENCOUNTER — Encounter: Payer: Self-pay | Admitting: Orthopaedic Surgery

## 2019-11-18 ENCOUNTER — Other Ambulatory Visit: Payer: Self-pay

## 2019-11-18 VITALS — BP 107/68 | HR 64 | Ht 67.0 in | Wt 147.0 lb

## 2019-11-18 DIAGNOSIS — M545 Low back pain: Secondary | ICD-10-CM | POA: Diagnosis not present

## 2019-11-18 DIAGNOSIS — G8929 Other chronic pain: Secondary | ICD-10-CM

## 2019-11-18 DIAGNOSIS — F17210 Nicotine dependence, cigarettes, uncomplicated: Secondary | ICD-10-CM

## 2019-11-18 MED ORDER — HYDROCODONE-ACETAMINOPHEN 5-325 MG PO TABS: 1.0000 | ORAL_TABLET | Freq: Four times a day (QID) | ORAL | 0 refills | Status: DC | PRN

## 2019-11-18 NOTE — Progress Notes (Signed)
[name], female DOB:Feb 23, 1966, 54 y.o. QPR:[id number]  Chief Complaint  Patient presents with  . Back Pain    HPI  Kaylee Ochoa is a 54 y.o. female who has chronic lower back pain.  She had increased pain from using a zip line on vacation.  She is better today but still has pain.  She has no weakness. She is taking her medicine.   Body mass index is 23.02 kg/m.  ROS  Review of Systems  Constitutional:       Patient does not have Diabetes Mellitus. Patient has hypertension. Patient has COPD or shortness of breath. Patient does not have BMI > 35. Patient has current smoking history.  HENT: Negative for congestion.   Respiratory: Positive for cough and shortness of breath.   Cardiovascular: Negative for chest pain.  Endocrine: Positive for cold intolerance.  Musculoskeletal: Positive for arthralgias and back pain.  Allergic/Immunologic: Positive for environmental allergies.  All other systems reviewed and are negative.   All other systems reviewed and are negative.  The following is a summary of the past history medically, past history surgically, known current medicines, social history and family history.  This information is gathered electronically by the computer from prior information and documentation.  I review this each visit and have found including this information at this point in the chart is beneficial and informative.    Past Medical History:  Diagnosis Date  . Depression   . ETOH abuse   . Hypertension   . Renal disorder     Past Surgical History:  Procedure Laterality Date  . BREAST SURGERY    . cyst removal on breast    . DILITATION & CURRETTAGE/HYSTROSCOPY WITH NOVASURE ABLATION N/A 02/27/2016   Procedure: DILATATION & CURETTAGE/HYSTEROSCOPY  Procedure #2;  Surgeon: [name], MD;  Location: [facility];  Service: Gynecology;  Laterality: N/A;  . LAPAROSCOPIC BILATERAL SALPINGECTOMY N/A 02/27/2016   Procedure: LAPAROSCOPIC  BILATERAL SALPINGECTOMY; LAPAROSCOPIC LYSIS OF ADHESIONS Procedure #1;  Surgeon: [name], MD;  Location: [facility];  Service: Gynecology;  Laterality: N/A;    Family History  Problem Relation Age of Onset  . Muscular dystrophy Mother   . Other Father        died in house fire  . Cancer Sister        esophageal  . Cancer Maternal Grandmother   . Heart attack Maternal Grandfather     Social History Social History   Tobacco Use  . Smoking status: Current Every Day Smoker    Packs/day: 0.25    Years: 20.00    Pack years: 5.00    Types: Cigarettes  . Smokeless tobacco: Never Used  Substance Use Topics  . Alcohol use: No    Comment: stopped July 2016  . Drug use: No    Allergies  Allergen Reactions  . Other Hives, Itching and Rash    UNKNOWN ANTIBIOTIC: Patient cannot recall name of the medication but it casued a rash all over the body, hives, and itching  . Sulfa Antibiotics Hives    Current Outpatient Medications  Medication Sig Dispense Refill  . albuterol (PROVENTIL HFA;VENTOLIN HFA) 108 (90 BASE) MCG/ACT inhaler Inhale 2 puffs into the lungs every 6 (six) hours as needed for wheezing or shortness of breath. 1 Inhaler 0  . busPIRone (BUSPAR) 10 MG tablet Take 10 mg by mouth 3 (three) times daily.  2  . HYDROcodone-acetaminophen (NORCO/VICODIN) 5-325 MG tablet Take 1 tablet by mouth every  6 (six) hours as needed for moderate pain. 25 tablet 0  . levothyroxine (SYNTHROID, LEVOTHROID) 50 MCG tablet Take 50 mcg by mouth daily.  1  . lisinopril (PRINIVIL,ZESTRIL) 40 MG tablet Take 40 mg by mouth daily.  6  . loratadine (CLARITIN) 10 MG tablet Take 10 mg by mouth daily.    Marland Kitchen lovastatin (MEVACOR) 20 MG tablet   6  . mirtazapine (REMERON) 15 MG tablet Take 15 mg by mouth at bedtime.  2  . Multiple Vitamin (MULTIVITAMIN WITH MINERALS) TABS tablet Take 1 tablet by mouth daily.    . naproxen (NAPROSYN) 500 MG tablet TAKE 1 TABLET BY MOUTH TWICE DAILY AS NEEDED FOR MENSTRUAL  CRAMPS  5  . nicotine (NICODERM CQ - DOSED IN MG/24 HOURS) 21 mg/24hr patch Place 1 patch (21 mg total) onto the skin daily. 28 patch 0  . Omega-3 Fatty Acids (FISH OIL PO) Take 1 capsule by mouth daily.     No current facility-administered medications for this visit.     Physical Exam  Blood pressure 107/68, pulse 64, height 5\' 7"  (1.702 m), weight 147 lb (66.7 kg).  Constitutional: overall normal hygiene, normal nutrition, well developed, normal grooming, normal body habitus. Assistive device:none  Musculoskeletal: gait and station Limp none, muscle tone and strength are normal, no tremors or atrophy is present.  .  Neurological: coordination overall normal.  Deep tendon reflex/nerve stretch intact.  Sensation normal.  Cranial nerves II-XII intact.   Skin:   Normal overall no scars, lesions, ulcers or rashes. No psoriasis.  Psychiatric: Alert and oriented x 3.  Recent memory intact, remote memory unclear.  Normal mood and affect. Well groomed.  Good eye contact.  Cardiovascular: overall no swelling, no varicosities, no edema bilaterally, normal temperatures of the legs and arms, no clubbing, cyanosis and good capillary refill.  Lymphatic: palpation is normal.  Spine/Pelvis examination:  Inspection:  Overall, sacoiliac joint benign and hips nontender; without crepitus or defects.   Thoracic spine inspection: Alignment normal without kyphosis present   Lumbar spine inspection:  Alignment  with normal lumbar lordosis, without scoliosis apparent.   Thoracic spine palpation:  without tenderness of spinal processes   Lumbar spine palpation: without tenderness of lumbar area; without tightness of lumbar muscles    Range of Motion:   Lumbar flexion, forward flexion is normal without pain or tenderness    Lumbar extension is full without pain or tenderness   Left lateral bend is normal without pain or tenderness   Right lateral bend is normal without pain or tenderness   Straight  leg raising is normal  Strength & tone: normal   Stability overall normal stability  All other systems reviewed and are negative   The patient has been educated about the nature of the problem(s) and counseled on treatment options.  The patient appeared to understand what I have discussed and is in agreement with it.  Encounter Diagnoses  Name Primary?  . Chronic midline low back pain without sciatica Yes  . Cigarette nicotine dependence without complication     PLAN Call if any problems.  Precautions discussed.  Continue current medications.   Return to clinic 3 months   I have reviewed the [place] web site prior to prescribing narcotic medicine for this patient.   Electronically Signed Sanjuana Kava, MD 8/5/20218:06 AM

## 2019-11-25 ENCOUNTER — Ambulatory Visit: Payer: Medicaid Other | Admitting: Orthopaedic Surgery

## 2019-12-21 ENCOUNTER — Other Ambulatory Visit: Payer: Self-pay | Admitting: Orthopaedic Surgery

## 2019-12-21 MED ORDER — HYDROCODONE-ACETAMINOPHEN 5-325 MG PO TABS: 1.0000 | ORAL_TABLET | Freq: Four times a day (QID) | ORAL | 0 refills | Status: DC | PRN

## 2020-01-19 ENCOUNTER — Other Ambulatory Visit: Payer: Self-pay | Admitting: Orthopaedic Surgery

## 2020-01-19 MED ORDER — HYDROCODONE-ACETAMINOPHEN 5-325 MG PO TABS: 1.0000 | ORAL_TABLET | Freq: Four times a day (QID) | ORAL | 0 refills | Status: DC | PRN

## 2020-02-24 ENCOUNTER — Other Ambulatory Visit: Payer: Self-pay

## 2020-02-24 ENCOUNTER — Ambulatory Visit (INDEPENDENT_AMBULATORY_CARE_PROVIDER_SITE_OTHER): Payer: Medicaid Other | Admitting: Orthopaedic Surgery

## 2020-02-24 ENCOUNTER — Encounter: Payer: Self-pay | Admitting: Orthopaedic Surgery

## 2020-02-24 DIAGNOSIS — M545 Low back pain, unspecified: Secondary | ICD-10-CM | POA: Diagnosis not present

## 2020-02-24 DIAGNOSIS — G8929 Other chronic pain: Secondary | ICD-10-CM

## 2020-02-24 DIAGNOSIS — F17210 Nicotine dependence, cigarettes, uncomplicated: Secondary | ICD-10-CM | POA: Diagnosis not present

## 2020-02-24 MED ORDER — HYDROCODONE-ACETAMINOPHEN 5-325 MG PO TABS: 1.0000 | ORAL_TABLET | Freq: Four times a day (QID) | ORAL | 0 refills | Status: DC | PRN

## 2020-02-24 NOTE — Progress Notes (Signed)
Virtual Visit via Telephone Note  I connected with@ on 02/24/20 at  8:00 AM EST by telephone and verified that I am speaking with the correct person using two identifiers.  Location: Patient: home Provider: office   I discussed the limitations, risks, security and privacy concerns of performing an evaluation and management service by telephone and the availability of in person appointments. I also discussed with the patient that there may be a patient responsible charge related to this service. The patient expressed understanding and agreed to proceed.   History of Present Illness: She has chronic lower back pain.  She has good and bad days, more pain on cold days.  She has no new trauma.  She is taking her medicine and doing her exercises.   Observations/Objective: Per above.  Assessment and Plan: Encounter Diagnoses  Name Primary?   Chronic midline low back pain without sciatica Yes   Cigarette nicotine dependence without complication      Follow Up Instructions: Three months.  I have reviewed the [place] web site prior to prescribing narcotic medicine for this patient.      I discussed the assessment and treatment plan with the patient. The patient was provided an opportunity to ask questions and all were answered. The patient agreed with the plan and demonstrated an understanding of the instructions.   The patient was advised to call back or seek an in-person evaluation if the symptoms worsen or if the condition fails to improve as anticipated.  I provided 8 minutes of non-face-to-face time during this encounter.   Sanjuana Kava, MD

## 2020-03-21 ENCOUNTER — Other Ambulatory Visit: Payer: Self-pay | Admitting: Orthopaedic Surgery

## 2020-03-21 MED ORDER — HYDROCODONE-ACETAMINOPHEN 5-325 MG PO TABS: 1.0000 | ORAL_TABLET | Freq: Four times a day (QID) | ORAL | 0 refills | Status: DC | PRN

## 2020-04-25 ENCOUNTER — Telehealth: Payer: Self-pay | Admitting: Orthopaedic Surgery

## 2020-04-25 MED ORDER — HYDROCODONE-ACETAMINOPHEN 5-325 MG PO TABS: 1.0000 | ORAL_TABLET | Freq: Four times a day (QID) | ORAL | 0 refills | Status: DC | PRN

## 2020-05-25 ENCOUNTER — Encounter: Payer: Self-pay | Admitting: Orthopaedic Surgery

## 2020-05-25 ENCOUNTER — Other Ambulatory Visit: Payer: Self-pay

## 2020-05-25 ENCOUNTER — Ambulatory Visit (INDEPENDENT_AMBULATORY_CARE_PROVIDER_SITE_OTHER): Payer: Medicaid Other | Admitting: Orthopaedic Surgery

## 2020-05-25 VITALS — BP 114/72 | HR 69 | Ht 67.0 in

## 2020-05-25 DIAGNOSIS — F17210 Nicotine dependence, cigarettes, uncomplicated: Secondary | ICD-10-CM

## 2020-05-25 DIAGNOSIS — G8929 Other chronic pain: Secondary | ICD-10-CM

## 2020-05-25 DIAGNOSIS — M545 Low back pain, unspecified: Secondary | ICD-10-CM | POA: Diagnosis not present

## 2020-05-25 MED ORDER — HYDROCODONE-ACETAMINOPHEN 5-325 MG PO TABS: 1.0000 | ORAL_TABLET | Freq: Four times a day (QID) | ORAL | 0 refills | Status: DC | PRN

## 2020-05-25 NOTE — Progress Notes (Signed)
[name], female DOB:Sep 22, 1965, 55 y.o. AOZ:[id number]  Chief Complaint  Patient presents with  . Back Pain    Lower back pain, patient reports back is about the same,     HPI  Kaylee Ochoa is a 55 y.o. female who has lower back pain that is stable.  She has more pain with the cold weather and with increased activity.  She has no new trauma, no weakness. She is taking her medicine and doing her exercises.   Body mass index is 23.02 kg/m.  ROS  Review of Systems  Constitutional:       Patient does not have Diabetes Mellitus. Patient has hypertension. Patient has COPD or shortness of breath. Patient does not have BMI > 35. Patient has current smoking history.  HENT: Negative for congestion.   Respiratory: Positive for cough and shortness of breath.   Cardiovascular: Negative for chest pain.  Endocrine: Positive for cold intolerance.  Musculoskeletal: Positive for arthralgias and back pain.  Allergic/Immunologic: Positive for environmental allergies.  All other systems reviewed and are negative.   All other systems reviewed and are negative.  The following is a summary of the past history medically, past history surgically, known current medicines, social history and family history.  This information is gathered electronically by the computer from prior information and documentation.  I review this each visit and have found including this information at this point in the chart is beneficial and informative.    Past Medical History:  Diagnosis Date  . Depression   . ETOH abuse   . Hypertension   . Renal disorder     Past Surgical History:  Procedure Laterality Date  . BREAST SURGERY    . cyst removal on breast    . DILITATION & CURRETTAGE/HYSTROSCOPY WITH NOVASURE ABLATION N/A 02/27/2016   Procedure: DILATATION & CURETTAGE/HYSTEROSCOPY  Procedure #2;  Surgeon: [name], MD;  Location: [facility];  Service: Gynecology;  Laterality: N/A;  .  LAPAROSCOPIC BILATERAL SALPINGECTOMY N/A 02/27/2016   Procedure: LAPAROSCOPIC BILATERAL SALPINGECTOMY; LAPAROSCOPIC LYSIS OF ADHESIONS Procedure #1;  Surgeon: [name], MD;  Location: [facility];  Service: Gynecology;  Laterality: N/A;    Family History  Problem Relation Age of Onset  . Muscular dystrophy Mother   . Other Father        died in house fire  . Cancer Sister        esophageal  . Cancer Maternal Grandmother   . Heart attack Maternal Grandfather     Social History Social History   Tobacco Use  . Smoking status: Current Every Day Smoker    Packs/day: 0.25    Years: 20.00    Pack years: 5.00    Types: Cigarettes  . Smokeless tobacco: Never Used  Substance Use Topics  . Alcohol use: No    Comment: stopped July 2016  . Drug use: No    Allergies  Allergen Reactions  . Other Hives, Itching and Rash    UNKNOWN ANTIBIOTIC: Patient cannot recall name of the medication but it casued a rash all over the body, hives, and itching  . Sulfa Antibiotics Hives    Current Outpatient Medications  Medication Sig Dispense Refill  . albuterol (PROVENTIL HFA;VENTOLIN HFA) 108 (90 BASE) MCG/ACT inhaler Inhale 2 puffs into the lungs every 6 (six) hours as needed for wheezing or shortness of breath. 1 Inhaler 0  . busPIRone (BUSPAR) 10 MG tablet Take 10 mg by mouth 3 (three) times daily.  2  . levothyroxine (SYNTHROID, LEVOTHROID) 50 MCG tablet Take 50 mcg by mouth daily.  1  . lisinopril (PRINIVIL,ZESTRIL) 40 MG tablet Take 40 mg by mouth daily.  6  . loratadine (CLARITIN) 10 MG tablet Take 10 mg by mouth daily.    Marland Kitchen lovastatin (MEVACOR) 20 MG tablet   6  . mirtazapine (REMERON) 15 MG tablet Take 15 mg by mouth at bedtime.  2  . Multiple Vitamin (MULTIVITAMIN WITH MINERALS) TABS tablet Take 1 tablet by mouth daily.    . naproxen (NAPROSYN) 500 MG tablet TAKE 1 TABLET BY MOUTH TWICE DAILY AS NEEDED FOR MENSTRUAL CRAMPS  5  . nicotine (NICODERM CQ - DOSED IN MG/24 HOURS) 21  mg/24hr patch Place 1 patch (21 mg total) onto the skin daily. 28 patch 0  . Omega-3 Fatty Acids (FISH OIL PO) Take 1 capsule by mouth daily.    Marland Kitchen HYDROcodone-acetaminophen (NORCO/VICODIN) 5-325 MG tablet Take 1 tablet by mouth every 6 (six) hours as needed for moderate pain. 25 tablet 0   No current facility-administered medications for this visit.     Physical Exam  Blood pressure 114/72, pulse 69, height 5\' 7"  (1.702 m).  Constitutional: overall normal hygiene, normal nutrition, well developed, normal grooming, normal body habitus. Assistive device:none  Musculoskeletal: gait and station Limp none, muscle tone and strength are normal, no tremors or atrophy is present.  .  Neurological: coordination overall normal.  Deep tendon reflex/nerve stretch intact.  Sensation normal.  Cranial nerves II-XII intact.   Skin:   Normal overall no scars, lesions, ulcers or rashes. No psoriasis.  Psychiatric: Alert and oriented x 3.  Recent memory intact, remote memory unclear.  Normal mood and affect. Well groomed.  Good eye contact.  Cardiovascular: overall no swelling, no varicosities, no edema bilaterally, normal temperatures of the legs and arms, no clubbing, cyanosis and good capillary refill.  Lymphatic: palpation is normal.  All other systems reviewed and are negative   Spine/Pelvis examination:  Inspection:  Overall, sacoiliac joint benign and hips nontender; without crepitus or defects.   Thoracic spine inspection: Alignment normal without kyphosis present   Lumbar spine inspection:  Alignment  with normal lumbar lordosis, without scoliosis apparent.   Thoracic spine palpation:  without tenderness of spinal processes   Lumbar spine palpation: without tenderness of lumbar area; without tightness of lumbar muscles    Range of Motion:   Lumbar flexion, forward flexion is normal without pain or tenderness    Lumbar extension is full without pain or tenderness   Left lateral bend is  normal without pain or tenderness   Right lateral bend is normal without pain or tenderness   Straight leg raising is normal  Strength & tone: normal   Stability overall normal stability  The patient has been educated about the nature of the problem(s) and counseled on treatment options.  The patient appeared to understand what I have discussed and is in agreement with it.  Encounter Diagnoses  Name Primary?  . Chronic midline low back pain without sciatica Yes  . Cigarette nicotine dependence without complication     PLAN Call if any problems.  Precautions discussed.  Continue current medications.   Return to clinic 3 months   I have reviewed the [place] web site prior to prescribing narcotic medicine for this patient.   Electronically Signed Sanjuana Kava, MD 2/10/20228:20 AM

## 2020-06-20 ENCOUNTER — Telehealth: Payer: Self-pay | Admitting: Orthopaedic Surgery

## 2020-06-20 MED ORDER — HYDROCODONE-ACETAMINOPHEN 5-325 MG PO TABS: 1.0000 | ORAL_TABLET | Freq: Four times a day (QID) | ORAL | 0 refills | Status: DC | PRN

## 2020-07-20 ENCOUNTER — Telehealth: Payer: Self-pay | Admitting: Orthopaedic Surgery

## 2020-07-20 MED ORDER — HYDROCODONE-ACETAMINOPHEN 5-325 MG PO TABS: 1.0000 | ORAL_TABLET | Freq: Four times a day (QID) | ORAL | 0 refills | Status: DC | PRN

## 2020-08-22 ENCOUNTER — Ambulatory Visit: Payer: Medicaid Other | Admitting: Orthopaedic Surgery

## 2020-08-22 ENCOUNTER — Encounter: Payer: Self-pay | Admitting: Orthopaedic Surgery

## 2020-08-22 ENCOUNTER — Other Ambulatory Visit: Payer: Self-pay

## 2020-08-22 VITALS — BP 110/65 | HR 72 | Ht 67.0 in | Wt 147.0 lb

## 2020-08-22 DIAGNOSIS — M545 Low back pain, unspecified: Secondary | ICD-10-CM | POA: Diagnosis not present

## 2020-08-22 DIAGNOSIS — G8929 Other chronic pain: Secondary | ICD-10-CM | POA: Diagnosis not present

## 2020-08-22 DIAGNOSIS — F17210 Nicotine dependence, cigarettes, uncomplicated: Secondary | ICD-10-CM | POA: Diagnosis not present

## 2020-08-22 MED ORDER — HYDROCODONE-ACETAMINOPHEN 5-325 MG PO TABS: 1.0000 | ORAL_TABLET | Freq: Four times a day (QID) | ORAL | 0 refills | Status: DC | PRN

## 2020-08-22 NOTE — Progress Notes (Signed)
[name], female DOB:1965/05/07, 55 y.o. CWC:[id number]  Chief Complaint  Patient presents with  . Back Pain    HPI  Kaylee Ochoa is a 55 y.o. female who has chronic lower back pain. She works third shift on the weekends and has more pain on Mondays.  She has no weakness, no numbness. She is doing her exercises and taking her medicine.  She has no new trauma.   Body mass index is 23.02 kg/m.  ROS  Review of Systems  Constitutional:       Patient does not have Diabetes Mellitus. Patient has hypertension. Patient has COPD or shortness of breath. Patient does not have BMI > 35. Patient has current smoking history.  HENT: Negative for congestion.   Respiratory: Positive for cough and shortness of breath.   Cardiovascular: Negative for chest pain.  Endocrine: Positive for cold intolerance.  Musculoskeletal: Positive for arthralgias and back pain.  Allergic/Immunologic: Positive for environmental allergies.  All other systems reviewed and are negative.   All other systems reviewed and are negative.  The following is a summary of the past history medically, past history surgically, known current medicines, social history and family history.  This information is gathered electronically by the computer from prior information and documentation.  I review this each visit and have found including this information at this point in the chart is beneficial and informative.    Past Medical History:  Diagnosis Date  . Depression   . ETOH abuse   . Hypertension   . Renal disorder     Past Surgical History:  Procedure Laterality Date  . BREAST SURGERY    . cyst removal on breast    . DILITATION & CURRETTAGE/HYSTROSCOPY WITH NOVASURE ABLATION N/A 02/27/2016   Procedure: DILATATION & CURETTAGE/HYSTEROSCOPY  Procedure #2;  Surgeon: [name], MD;  Location: [facility];  Service: Gynecology;  Laterality: N/A;  . LAPAROSCOPIC BILATERAL SALPINGECTOMY N/A 02/27/2016    Procedure: LAPAROSCOPIC BILATERAL SALPINGECTOMY; LAPAROSCOPIC LYSIS OF ADHESIONS Procedure #1;  Surgeon: [name], MD;  Location: [facility];  Service: Gynecology;  Laterality: N/A;    Family History  Problem Relation Age of Onset  . Muscular dystrophy Mother   . Other Father        died in house fire  . Cancer Sister        esophageal  . Cancer Maternal Grandmother   . Heart attack Maternal Grandfather     Social History Social History   Tobacco Use  . Smoking status: Current Every Day Smoker    Packs/day: 0.25    Years: 20.00    Pack years: 5.00    Types: Cigarettes  . Smokeless tobacco: Never Used  Substance Use Topics  . Alcohol use: No    Comment: stopped July 2016  . Drug use: No    Allergies  Allergen Reactions  . Other Hives, Itching and Rash    UNKNOWN ANTIBIOTIC: Patient cannot recall name of the medication but it casued a rash all over the body, hives, and itching  . Sulfa Antibiotics Hives    Current Outpatient Medications  Medication Sig Dispense Refill  . albuterol (PROVENTIL HFA;VENTOLIN HFA) 108 (90 BASE) MCG/ACT inhaler Inhale 2 puffs into the lungs every 6 (six) hours as needed for wheezing or shortness of breath. 1 Inhaler 0  . busPIRone (BUSPAR) 10 MG tablet Take 10 mg by mouth 3 (three) times daily.  2  . levothyroxine (SYNTHROID, LEVOTHROID) 50 MCG tablet Take  50 mcg by mouth daily.  1  . lisinopril (PRINIVIL,ZESTRIL) 40 MG tablet Take 40 mg by mouth daily.  6  . loratadine (CLARITIN) 10 MG tablet Take 10 mg by mouth daily.    Marland Kitchen lovastatin (MEVACOR) 20 MG tablet   6  . mirtazapine (REMERON) 15 MG tablet Take 15 mg by mouth at bedtime.  2  . Multiple Vitamin (MULTIVITAMIN WITH MINERALS) TABS tablet Take 1 tablet by mouth daily.    . naproxen (NAPROSYN) 500 MG tablet TAKE 1 TABLET BY MOUTH TWICE DAILY AS NEEDED FOR MENSTRUAL CRAMPS  5  . nicotine (NICODERM CQ - DOSED IN MG/24 HOURS) 21 mg/24hr patch Place 1 patch (21 mg total) onto the skin  daily. 28 patch 0  . Omega-3 Fatty Acids (FISH OIL PO) Take 1 capsule by mouth daily.    Marland Kitchen HYDROcodone-acetaminophen (NORCO/VICODIN) 5-325 MG tablet Take 1 tablet by mouth every 6 (six) hours as needed for moderate pain. 25 tablet 0   No current facility-administered medications for this visit.     Physical Exam  Blood pressure 110/65, pulse 72, height 5\' 7"  (1.702 m), weight 147 lb (66.7 kg).  Constitutional: overall normal hygiene, normal nutrition, well developed, normal grooming, normal body habitus. Assistive device:none  Musculoskeletal: gait and station Limp none, muscle tone and strength are normal, no tremors or atrophy is present.  .  Neurological: coordination overall normal.  Deep tendon reflex/nerve stretch intact.  Sensation normal.  Cranial nerves II-XII intact.   Skin:   Normal overall no scars, lesions, ulcers or rashes. No psoriasis.  Psychiatric: Alert and oriented x 3.  Recent memory intact, remote memory unclear.  Normal mood and affect. Well groomed.  Good eye contact.  Cardiovascular: overall no swelling, no varicosities, no edema bilaterally, normal temperatures of the legs and arms, no clubbing, cyanosis and good capillary refill.  Spine/Pelvis examination:  Inspection:  Overall, sacoiliac joint benign and hips nontender; without crepitus or defects.   Thoracic spine inspection: Alignment normal without kyphosis present   Lumbar spine inspection:  Alignment  with normal lumbar lordosis, without scoliosis apparent.   Thoracic spine palpation:  without tenderness of spinal processes   Lumbar spine palpation: without tenderness of lumbar area; without tightness of lumbar muscles    Range of Motion:   Lumbar flexion, forward flexion is normal without pain or tenderness    Lumbar extension is full without pain or tenderness   Left lateral bend is normal without pain or tenderness   Right lateral bend is normal without pain or tenderness   Straight leg  raising is normal  Strength & tone: normal   Stability overall normal stability' Lymphatic: palpation is normal.  All other systems reviewed and are negative   The patient has been educated about the nature of the problem(s) and counseled on treatment options.  The patient appeared to understand what I have discussed and is in agreement with it.  Encounter Diagnoses  Name Primary?  . Chronic midline low back pain without sciatica Yes  . Cigarette nicotine dependence without complication     PLAN Call if any problems.  Precautions discussed.  Continue current medications.   Return to clinic 3 months   I have reviewed the [place] web site prior to prescribing narcotic medicine for this patient.   Electronically Signed Sanjuana Kava, MD 5/10/20228:08 AM

## 2020-09-21 ENCOUNTER — Telehealth: Payer: Self-pay | Admitting: Orthopaedic Surgery

## 2020-09-21 MED ORDER — HYDROCODONE-ACETAMINOPHEN 5-325 MG PO TABS: 1.0000 | ORAL_TABLET | Freq: Four times a day (QID) | ORAL | 0 refills | Status: DC | PRN

## 2020-10-18 ENCOUNTER — Other Ambulatory Visit: Payer: Self-pay | Admitting: Orthopaedic Surgery

## 2020-10-18 MED ORDER — HYDROCODONE-ACETAMINOPHEN 5-325 MG PO TABS: 1.0000 | ORAL_TABLET | Freq: Four times a day (QID) | ORAL | 0 refills | Status: DC | PRN

## 2020-11-28 ENCOUNTER — Encounter: Payer: Self-pay | Admitting: Orthopaedic Surgery

## 2020-11-28 ENCOUNTER — Ambulatory Visit: Payer: Medicaid Other | Admitting: Orthopaedic Surgery

## 2020-11-28 ENCOUNTER — Other Ambulatory Visit: Payer: Self-pay

## 2020-11-28 VITALS — BP 113/61 | HR 88 | Ht 67.0 in | Wt 146.8 lb

## 2020-11-28 DIAGNOSIS — G8929 Other chronic pain: Secondary | ICD-10-CM | POA: Diagnosis not present

## 2020-11-28 DIAGNOSIS — F17210 Nicotine dependence, cigarettes, uncomplicated: Secondary | ICD-10-CM | POA: Diagnosis not present

## 2020-11-28 DIAGNOSIS — M545 Low back pain, unspecified: Secondary | ICD-10-CM | POA: Diagnosis not present

## 2020-11-28 MED ORDER — HYDROCODONE-ACETAMINOPHEN 5-325 MG PO TABS: 1.0000 | ORAL_TABLET | Freq: Four times a day (QID) | ORAL | 0 refills | Status: DC | PRN

## 2020-11-28 NOTE — Patient Instructions (Signed)
Steps to Quit Smoking Smoking tobacco is the leading cause of preventable death. It can affect almost every organ in the body. Smoking puts you and people around you at risk for many serious, long-lasting (chronic) diseases. Quitting smoking can be hard, but it is one of the best things that you can do for your health. It is never too late to quit. How do I get ready to quit? When you decide to quit smoking, make a plan to help you succeed. Before you quit: Pick a date to quit. Set a date within the next 2 weeks to give you time to prepare. Write down the reasons why you are quitting. Keep this list in places where you will see it often. Tell your family, friends, and co-workers that you are quitting. Their support is important. Talk with your doctor about the choices that may help you quit. Find out if your health insurance will pay for these treatments. Know the people, places, things, and activities that make you want to smoke (triggers). Avoid them. What first steps can I take to quit smoking? Throw away all cigarettes at home, at work, and in your car. Throw away the things that you use when you smoke, such as ashtrays and lighters. Clean your car. Make sure to empty the ashtray. Clean your home, including curtains and carpets. What can I do to help me quit smoking? Talk with your doctor about taking medicines and seeing a counselor at the same time. You are more likely to succeed when you do both. If you are pregnant or breastfeeding, talk with your doctor about counseling or other ways to quit smoking. Do not take medicine to help you quit smoking unless your doctor tells you to do so. To quit smoking: Quit right away Quit smoking totally, instead of slowly cutting back on how much you smoke over a period of time. Go to counseling. You are more likely to quit if you go to counseling sessions regularly. Take medicine You may take medicines to help you quit. Some medicines need a  prescription, and some you can buy over-the-counter. Some medicines may contain a drug called nicotine to replace the nicotine in cigarettes. Medicines may: Help you to stop having the desire to smoke (cravings). Help to stop the problems that come when you stop smoking (withdrawal symptoms). Your doctor may ask you to use: Nicotine patches, gum, or lozenges. Nicotine inhalers or sprays. Non-nicotine medicine that is taken by mouth. Find resources Find resources and other ways to help you quit smoking and remain smoke-free after you quit. These resources are most helpful when you use them often. They include: Online chats with a counselor. Phone quitlines. Printed self-help materials. Support groups or group counseling. Text messaging programs. Mobile phone apps. Use apps on your mobile phone or tablet that can help you stick to your quit plan. There are many free apps for mobile phones and tablets as well as websites. Examples include Quit Guide from the CDC and smokefree.gov  What things can I do to make it easier to quit?  Talk to your family and friends. Ask them to support and encourage you. Call a phone quitline (1-800-QUIT-NOW), reach out to support groups, or work with a counselor. Ask people who smoke to not smoke around you. Avoid places that make you want to smoke, such as: Bars. Parties. Smoke-break areas at work. Spend time with people who do not smoke. Lower the stress in your life. Stress can make you want to   smoke. Try these things to help your stress: Getting regular exercise. Doing deep-breathing exercises. Doing yoga. Meditating. Doing a body scan. To do this, close your eyes, focus on one area of your body at a time from head to toe. Notice which parts of your body are tense. Try to relax the muscles in those areas. How will I feel when I quit smoking? Day 1 to 3 weeks Within the first 24 hours, you may start to have some problems that come from quitting tobacco.  These problems are very bad 2-3 days after you quit, but they do not often last for more than 2-3 weeks. You may get these symptoms: Mood swings. Feeling restless, nervous, angry, or annoyed. Trouble concentrating. Dizziness. Strong desire for high-sugar foods and nicotine. Weight gain. Trouble pooping (constipation). Feeling like you may vomit (nausea). Coughing or a sore throat. Changes in how the medicines that you take for other issues work in your body. Depression. Trouble sleeping (insomnia). Week 3 and afterward After the first 2-3 weeks of quitting, you may start to notice more positive results, such as: Better sense of smell and taste. Less coughing and sore throat. Slower heart rate. Lower blood pressure. Clearer skin. Better breathing. Fewer sick days. Quitting smoking can be hard. Do not give up if you fail the first time. Some people need to try a few times before they succeed. Do your best to stick to your quit plan, and talk with your doctor if you have any questions or concerns. Summary Smoking tobacco is the leading cause of preventable death. Quitting smoking can be hard, but it is one of the best things that you can do for your health. When you decide to quit smoking, make a plan to help you succeed. Quit smoking right away, not slowly over a period of time. When you start quitting, seek help from your doctor, family, or friends. This information is not intended to replace advice given to you by your health care provider. Make sure you discuss any questions you have with your health care provider. Document Revised: 12/25/2018 Document Reviewed: 06/20/2018 Elsevier Patient Education  2022 Elsevier Inc.  

## 2020-11-28 NOTE — Progress Notes (Signed)
My back hurts some  She has chronic lower back pain with good and bad days.  She has more pain with increased activity.  She has no new trauma, no numbness, no weakness. She is active and doing her exercises and taking her medicine.  Spine/Pelvis examination:  Inspection:  Overall, sacoiliac joint benign and hips nontender; without crepitus or defects.   Thoracic spine inspection: Alignment normal without kyphosis present   Lumbar spine inspection:  Alignment  with normal lumbar lordosis, without scoliosis apparent.   Thoracic spine palpation:  without tenderness of spinal processes   Lumbar spine palpation: without tenderness of lumbar area; without tightness of lumbar muscles    Range of Motion:   Lumbar flexion, forward flexion is normal without pain or tenderness    Lumbar extension is full without pain or tenderness   Left lateral bend is normal without pain or tenderness   Right lateral bend is normal without pain or tenderness   Straight leg raising is normal  Strength & tone: normal   Stability overall normal stability  Encounter Diagnoses  Name Primary?   Chronic midline low back pain without sciatica Yes   Cigarette nicotine dependence without complication    I will refill pain medicine.  I have reviewed the [place] web site prior to prescribing narcotic medicine for this patient.  Return in three months.  Call if any problem.  Continue exercises.  Try to cut back on smoking.  Precautions discussed.  Electronically Signed Sanjuana Kava, MD 8/16/20222:36 PM

## 2020-12-26 ENCOUNTER — Telehealth: Payer: Self-pay | Admitting: Orthopaedic Surgery

## 2020-12-26 MED ORDER — HYDROCODONE-ACETAMINOPHEN 5-325 MG PO TABS: 1.0000 | ORAL_TABLET | Freq: Four times a day (QID) | ORAL | 0 refills | Status: DC | PRN

## 2021-01-24 ENCOUNTER — Telehealth: Payer: Self-pay | Admitting: Orthopaedic Surgery

## 2021-01-24 MED ORDER — HYDROCODONE-ACETAMINOPHEN 5-325 MG PO TABS: 1.0000 | ORAL_TABLET | Freq: Four times a day (QID) | ORAL | 0 refills | Status: DC | PRN

## 2021-02-27 ENCOUNTER — Ambulatory Visit (INDEPENDENT_AMBULATORY_CARE_PROVIDER_SITE_OTHER): Payer: Medicaid Other | Admitting: Orthopaedic Surgery

## 2021-02-27 ENCOUNTER — Encounter: Payer: Self-pay | Admitting: Orthopaedic Surgery

## 2021-02-27 ENCOUNTER — Other Ambulatory Visit: Payer: Self-pay

## 2021-02-27 VITALS — BP 105/60 | HR 67 | Ht 67.0 in | Wt 145.6 lb

## 2021-02-27 DIAGNOSIS — F17210 Nicotine dependence, cigarettes, uncomplicated: Secondary | ICD-10-CM | POA: Diagnosis not present

## 2021-02-27 DIAGNOSIS — M545 Low back pain, unspecified: Secondary | ICD-10-CM

## 2021-02-27 DIAGNOSIS — G8929 Other chronic pain: Secondary | ICD-10-CM | POA: Diagnosis not present

## 2021-02-27 MED ORDER — HYDROCODONE-ACETAMINOPHEN 5-325 MG PO TABS: 1.0000 | ORAL_TABLET | Freq: Four times a day (QID) | ORAL | 0 refills | Status: DC | PRN

## 2021-02-27 NOTE — Progress Notes (Signed)
I had more pain in the back recently.  She moved the end of October and did a lot of lifting and pulling.  She had a flare up of the lower back pain then.  She is better, a little now, but she has had some bad days.  The cold weather had not helped.  She has no direct trauma, no weakness, no numbness.  She is taking her medicine and doing her exercises.  Spine/Pelvis examination:  Inspection:  Overall, sacoiliac joint benign and hips nontender; without crepitus or defects.   Thoracic spine inspection: Alignment normal without kyphosis present   Lumbar spine inspection:  Alignment  with normal lumbar lordosis, without scoliosis apparent.   Thoracic spine palpation:  without tenderness of spinal processes   Lumbar spine palpation: without tenderness of lumbar area; without tightness of lumbar muscles    Range of Motion:   Lumbar flexion, forward flexion is normal without pain or tenderness    Lumbar extension is full without pain or tenderness   Left lateral bend is normal without pain or tenderness   Right lateral bend is normal without pain or tenderness   Straight leg raising is normal  Strength & tone: normal   Stability overall normal stability  Encounter Diagnoses  Name Primary?   Chronic midline low back pain without sciatica Yes   Cigarette nicotine dependence without complication    I will renew pain medicine.  Continue other medicine.  Return in three months.  Continue back exercises.  Call if any problem.  Precautions discussed.  Electronically Signed Sanjuana Kava, MD 11/15/20228:25 AM

## 2021-03-27 ENCOUNTER — Telehealth: Payer: Self-pay | Admitting: Orthopaedic Surgery

## 2021-03-27 MED ORDER — HYDROCODONE-ACETAMINOPHEN 5-325 MG PO TABS
1.0000 | ORAL_TABLET | Freq: Four times a day (QID) | ORAL | 0 refills | Status: DC | PRN
Start: 2021-03-27 — End: 2021-04-24

## 2021-04-24 ENCOUNTER — Telehealth: Payer: Self-pay | Admitting: Orthopaedic Surgery

## 2021-04-24 MED ORDER — HYDROCODONE-ACETAMINOPHEN 5-325 MG PO TABS: 1.0000 | ORAL_TABLET | Freq: Four times a day (QID) | ORAL | 0 refills | Status: DC | PRN

## 2021-05-29 ENCOUNTER — Ambulatory Visit (INDEPENDENT_AMBULATORY_CARE_PROVIDER_SITE_OTHER): Payer: Medicaid Other | Admitting: Orthopaedic Surgery

## 2021-05-29 ENCOUNTER — Other Ambulatory Visit: Payer: Self-pay

## 2021-05-29 ENCOUNTER — Encounter: Payer: Self-pay | Admitting: Orthopaedic Surgery

## 2021-05-29 VITALS — BP 105/51 | HR 68

## 2021-05-29 DIAGNOSIS — F17210 Nicotine dependence, cigarettes, uncomplicated: Secondary | ICD-10-CM | POA: Diagnosis not present

## 2021-05-29 DIAGNOSIS — M545 Low back pain, unspecified: Secondary | ICD-10-CM

## 2021-05-29 DIAGNOSIS — G8929 Other chronic pain: Secondary | ICD-10-CM

## 2021-05-29 MED ORDER — HYDROCODONE-ACETAMINOPHEN 5-325 MG PO TABS: 1.0000 | ORAL_TABLET | Freq: Four times a day (QID) | ORAL | 0 refills | Status: DC | PRN

## 2021-05-29 NOTE — Progress Notes (Signed)
My pain comes and goes.  She has good and bad days with her lower back.  The cold weather makes it worse.  She has no new trauma, no weakness.  She is doing her exercises and taking her medicine.  Spine/Pelvis examination:  Inspection:  Overall, sacoiliac joint benign and hips nontender; without crepitus or defects.   Thoracic spine inspection: Alignment normal without kyphosis present   Lumbar spine inspection:  Alignment  with normal lumbar lordosis, without scoliosis apparent.   Thoracic spine palpation:  without tenderness of spinal processes   Lumbar spine palpation: without tenderness of lumbar area; without tightness of lumbar muscles    Range of Motion:   Lumbar flexion, forward flexion is normal without pain or tenderness    Lumbar extension is full without pain or tenderness   Left lateral bend is normal without pain or tenderness   Right lateral bend is normal without pain or tenderness   Straight leg raising is normal  Strength & tone: normal   Stability overall normal stability  Encounter Diagnoses  Name Primary?   Chronic midline low back pain without sciatica Yes   Cigarette nicotine dependence without complication    I have reviewed the [place] web site prior to prescribing narcotic medicine for this patient.  Return in three months.  Call if any problem.  Precautions discussed.  Electronically Signed Sanjuana Kava, MD 2/14/20238:41 AM

## 2021-05-30 ENCOUNTER — Telehealth: Payer: Self-pay | Admitting: Orthopaedic Surgery

## 2021-05-30 NOTE — Telephone Encounter (Signed)
Patient left a voice message this morning; I returned call; patient relayed her prescription was not at [facility] in [place]. Per review of chart, prescription was sent to [facility] - relayed this information to patient. Patient then called back to let us know that she did pick it up at [facility], but that she wants to change it back to [facility] [place]. States if she is unable to make the change through MyChart when she next requests her refill, she will call back to request over phone.

## 2021-06-25 ENCOUNTER — Telehealth: Payer: Self-pay | Admitting: Orthopaedic Surgery

## 2021-06-25 MED ORDER — HYDROCODONE-ACETAMINOPHEN 5-325 MG PO TABS: 1.0000 | ORAL_TABLET | Freq: Four times a day (QID) | ORAL | 0 refills | Status: DC | PRN

## 2021-06-25 NOTE — Telephone Encounter (Signed)
Patient called to request refill of: ?HYDROcodone-acetaminophen (NORCO/VICODIN) 5-325 MG tablet 25 tablet  ?   PLEASE NOTE: PHARMACY:  ? Patient's pharmacy is [facility], [place]*Please update in system (states never did request [place]) ?

## 2021-07-24 ENCOUNTER — Telehealth: Payer: Self-pay | Admitting: Orthopaedic Surgery

## 2021-07-24 MED ORDER — HYDROCODONE-ACETAMINOPHEN 5-325 MG PO TABS: 1.0000 | ORAL_TABLET | Freq: Four times a day (QID) | ORAL | 0 refills | Status: DC | PRN

## 2021-08-21 ENCOUNTER — Encounter: Payer: Self-pay | Admitting: Orthopaedic Surgery

## 2021-08-21 ENCOUNTER — Ambulatory Visit (INDEPENDENT_AMBULATORY_CARE_PROVIDER_SITE_OTHER): Payer: Medicaid Other | Admitting: Orthopaedic Surgery

## 2021-08-21 VITALS — BP 90/52 | HR 63 | Ht 67.0 in | Wt 144.0 lb

## 2021-08-21 DIAGNOSIS — M545 Low back pain, unspecified: Secondary | ICD-10-CM | POA: Diagnosis not present

## 2021-08-21 DIAGNOSIS — G8929 Other chronic pain: Secondary | ICD-10-CM

## 2021-08-21 DIAGNOSIS — F17210 Nicotine dependence, cigarettes, uncomplicated: Secondary | ICD-10-CM

## 2021-08-21 MED ORDER — HYDROCODONE-ACETAMINOPHEN 5-325 MG PO TABS: 1.0000 | ORAL_TABLET | Freq: Four times a day (QID) | ORAL | 0 refills | Status: DC | PRN

## 2021-08-21 NOTE — Progress Notes (Signed)
My back is hurting more recently. ? ?She has had increased lower back pain over the last ten days or so.  She has no trauma, no untoward event.  She is doing her exercises and taking her medicine.  She has no weakness. ? ?Spine/Pelvis examination: ? Inspection:  Overall, sacoiliac joint benign and hips nontender; without crepitus or defects. ? ? Thoracic spine inspection: Alignment normal without kyphosis present ? ? Lumbar spine inspection:  Alignment  with normal lumbar lordosis, without scoliosis apparent. ? ? Thoracic spine palpation:  without tenderness of spinal processes ? ? Lumbar spine palpation: without tenderness of lumbar area; without tightness of lumbar muscles  ? ? Range of Motion: ?  Lumbar flexion, forward flexion is normal without pain or tenderness  ?  Lumbar extension is full without pain or tenderness ?  Left lateral bend is normal without pain or tenderness ?  Right lateral bend is normal without pain or tenderness ?  Straight leg raising is normal ? Strength & tone: normal ? ? Stability overall normal stability ? ?Encounter Diagnoses  ?Name Primary?  ? Chronic midline low back pain without sciatica Yes  ? Cigarette nicotine dependence without complication   ? ?Continue exercises and activity. ?I have reviewed the [place] web site prior to prescribing narcotic medicine for this patient. ? ?Call if any problem. Return in three months. ? ?Precautions discussed. ? ?Electronically Signed ?Sanjuana Kava, MD ?5/9/20238:29 AM ? ? ?

## 2021-08-28 ENCOUNTER — Ambulatory Visit: Payer: Medicaid Other | Admitting: Orthopaedic Surgery

## 2021-09-18 ENCOUNTER — Telehealth: Payer: Self-pay | Admitting: Orthopaedic Surgery

## 2021-09-18 MED ORDER — HYDROCODONE-ACETAMINOPHEN 5-325 MG PO TABS: 1.0000 | ORAL_TABLET | Freq: Four times a day (QID) | ORAL | 0 refills | Status: DC | PRN

## 2021-10-12 ENCOUNTER — Other Ambulatory Visit: Payer: Self-pay | Admitting: Orthopaedic Surgery

## 2021-10-12 NOTE — Telephone Encounter (Signed)
Patient request refill for her pain medicine   HYDROcodone-acetaminophen (NORCO/VICODIN) 5-325 MG tablet  Pharmacy: [facility] in [place]

## 2021-10-15 ENCOUNTER — Other Ambulatory Visit: Payer: Self-pay | Admitting: Orthopaedic Surgery

## 2021-10-17 MED ORDER — HYDROCODONE-ACETAMINOPHEN 5-325 MG PO TABS: 1.0000 | ORAL_TABLET | Freq: Four times a day (QID) | ORAL | 0 refills | Status: DC | PRN

## 2021-11-20 ENCOUNTER — Encounter: Payer: Self-pay | Admitting: Orthopaedic Surgery

## 2021-11-20 ENCOUNTER — Ambulatory Visit: Payer: Medicaid Other | Admitting: Orthopaedic Surgery

## 2021-11-20 VITALS — BP 109/59 | HR 67 | Ht 67.0 in | Wt 147.0 lb

## 2021-11-20 DIAGNOSIS — G8929 Other chronic pain: Secondary | ICD-10-CM

## 2021-11-20 DIAGNOSIS — F17210 Nicotine dependence, cigarettes, uncomplicated: Secondary | ICD-10-CM

## 2021-11-20 DIAGNOSIS — M545 Low back pain, unspecified: Secondary | ICD-10-CM | POA: Diagnosis not present

## 2021-11-20 MED ORDER — HYDROCODONE-ACETAMINOPHEN 5-325 MG PO TABS: 1.0000 | ORAL_TABLET | Freq: Four times a day (QID) | ORAL | 0 refills | Status: DC | PRN

## 2021-11-20 NOTE — Progress Notes (Signed)
My back is better.  She has less back pain with the warm weather.  She is active.  She has no weakness, no new trauma.  She is doing her exercises. She has cut back on her smoking.  Spine/Pelvis examination:  Inspection:  Overall, sacoiliac joint benign and hips nontender; without crepitus or defects.   Thoracic spine inspection: Alignment normal without kyphosis present   Lumbar spine inspection:  Alignment  with normal lumbar lordosis, without scoliosis apparent.   Thoracic spine palpation:  without tenderness of spinal processes   Lumbar spine palpation: without tenderness of lumbar area; without tightness of lumbar muscles    Range of Motion:   Lumbar flexion, forward flexion is normal without pain or tenderness    Lumbar extension is full without pain or tenderness   Left lateral bend is normal without pain or tenderness   Right lateral bend is normal without pain or tenderness   Straight leg raising is normal  Strength & tone: normal   Stability overall normal stability  Encounter Diagnoses  Name Primary?   Chronic midline low back pain without sciatica Yes   Cigarette nicotine dependence without complication    I have reviewed the [place] web site prior to prescribing narcotic medicine for this patient.  Return in three months.  Call if any problem.  Precautions discussed.  Electronically Signed Sanjuana Kava, MD 8/8/20238:22 AM

## 2021-12-18 ENCOUNTER — Telehealth: Payer: Self-pay | Admitting: Orthopaedic Surgery

## 2021-12-18 MED ORDER — HYDROCODONE-ACETAMINOPHEN 5-325 MG PO TABS: 1.0000 | ORAL_TABLET | Freq: Four times a day (QID) | ORAL | 0 refills | Status: DC | PRN

## 2022-01-17 ENCOUNTER — Telehealth: Payer: Self-pay | Admitting: Orthopaedic Surgery

## 2022-01-17 MED ORDER — HYDROCODONE-ACETAMINOPHEN 5-325 MG PO TABS: 1.0000 | ORAL_TABLET | Freq: Four times a day (QID) | ORAL | 0 refills | Status: DC | PRN

## 2022-02-20 ENCOUNTER — Ambulatory Visit: Payer: Medicaid Other | Admitting: Orthopaedic Surgery

## 2022-02-20 ENCOUNTER — Encounter: Payer: Self-pay | Admitting: Orthopaedic Surgery

## 2022-02-20 VITALS — BP 101/58 | HR 57 | Ht 67.0 in | Wt 152.4 lb

## 2022-02-20 DIAGNOSIS — G8929 Other chronic pain: Secondary | ICD-10-CM | POA: Diagnosis not present

## 2022-02-20 DIAGNOSIS — F17210 Nicotine dependence, cigarettes, uncomplicated: Secondary | ICD-10-CM | POA: Diagnosis not present

## 2022-02-20 DIAGNOSIS — M545 Low back pain, unspecified: Secondary | ICD-10-CM | POA: Diagnosis not present

## 2022-02-20 MED ORDER — HYDROCODONE-ACETAMINOPHEN 5-325 MG PO TABS: 1.0000 | ORAL_TABLET | Freq: Four times a day (QID) | ORAL | 0 refills | Status: DC | PRN

## 2022-02-20 NOTE — Patient Instructions (Signed)
As the weather changes and gets cooler, you may notice you are affected more. You may have more pain in your joints. This is normal. Dress warmly and make sure that area is covered well.   Dr.Keeling is here all day on Tuesdays, Wednesday mornings, and Thursday mornings. If you need anything such as a medication refill, please either call BEFORE the end of the day on [facility] or send a message through [place]. Your pharmacy can send a refill request for you. Calling by the end of the day on [facility] allows [place] time to send Dr.Keeling the request and for him to respond before he leaves on Thursdays.  If Dr. Luna Glasgow is out of the office, we may send it to one of the other providers and they may not refill it for the same amount that your original prescription is for.   F/u 3 mths for LBP

## 2022-02-20 NOTE — Progress Notes (Signed)
I have my good days  She has chronic lower back pain with good and bad days.  She does better on warm days, worse on rainy, cold days.  She has no new trauma.  Spine/Pelvis examination:  Inspection:  Overall, sacoiliac joint benign and hips nontender; without crepitus or defects.   Thoracic spine inspection: Alignment normal without kyphosis present   Lumbar spine inspection:  Alignment  with normal lumbar lordosis, without scoliosis apparent.   Thoracic spine palpation:  without tenderness of spinal processes   Lumbar spine palpation: without tenderness of lumbar area; without tightness of lumbar muscles    Range of Motion:   Lumbar flexion, forward flexion is normal without pain or tenderness    Lumbar extension is full without pain or tenderness   Left lateral bend is normal without pain or tenderness   Right lateral bend is normal without pain or tenderness   Straight leg raising is normal  Strength & tone: normal   Stability overall normal stability  Encounter Diagnoses  Name Primary?   Chronic midline low back pain without sciatica Yes   Cigarette nicotine dependence without complication    She is cutting back on her smoking.  I will refill her pain medicine.  Call if any problem.  Precautions discussed.  I have reviewed the [place] web site prior to prescribing narcotic medicine for this patient.  Electronically Signed Sanjuana Kava, MD 11/8/20238:43 AM

## 2022-03-19 ENCOUNTER — Telehealth: Payer: Self-pay | Admitting: Orthopaedic Surgery

## 2022-03-19 MED ORDER — HYDROCODONE-ACETAMINOPHEN 5-325 MG PO TABS: 1.0000 | ORAL_TABLET | Freq: Four times a day (QID) | ORAL | 0 refills | Status: DC | PRN

## 2022-04-17 ENCOUNTER — Telehealth: Payer: Self-pay | Admitting: Orthopaedic Surgery

## 2022-04-17 MED ORDER — HYDROCODONE-ACETAMINOPHEN 5-325 MG PO TABS: 1.0000 | ORAL_TABLET | Freq: Four times a day (QID) | ORAL | 0 refills | Status: DC | PRN

## 2022-05-22 ENCOUNTER — Ambulatory Visit: Payer: Medicaid Other | Admitting: Orthopaedic Surgery

## 2022-05-23 ENCOUNTER — Ambulatory Visit: Payer: Medicaid Other | Admitting: Orthopaedic Surgery

## 2022-05-24 ENCOUNTER — Telehealth: Payer: Self-pay | Admitting: Orthopaedic Surgery

## 2022-05-24 NOTE — Telephone Encounter (Signed)
Returned a call to the patient regarding rescheduling an appointment, lvm

## 2022-05-27 ENCOUNTER — Telehealth: Payer: Self-pay | Admitting: Orthopaedic Surgery

## 2022-05-27 NOTE — Telephone Encounter (Signed)
Returned the patient's call, lvm for the pt to call us back.

## 2022-05-27 NOTE — Telephone Encounter (Signed)
Returned the patient's call, lvm for her to call us back to rs.

## 2022-05-29 ENCOUNTER — Telehealth: Payer: Self-pay | Admitting: Orthopaedic Surgery

## 2022-05-29 NOTE — Telephone Encounter (Signed)
Patient called, requesting a refill for Hydrocodone 5-325 to be sent to [facility] in [place]

## 2022-05-30 ENCOUNTER — Telehealth: Payer: Self-pay | Admitting: Orthopaedic Surgery

## 2022-05-30 MED ORDER — HYDROCODONE-ACETAMINOPHEN 5-325 MG PO TABS: 1.0000 | ORAL_TABLET | Freq: Four times a day (QID) | ORAL | 0 refills | Status: DC | PRN

## 2022-06-04 ENCOUNTER — Other Ambulatory Visit: Payer: Self-pay

## 2022-06-05 ENCOUNTER — Telehealth: Payer: Self-pay

## 2022-06-05 NOTE — Telephone Encounter (Signed)
Patient left a message wanting a return call so she could set up an appointment with Dr. Arnoldo Morale. She wants one around the middle of March, I returned her call and had to leave her a message to call the office.

## 2022-06-13 ENCOUNTER — Encounter: Payer: Self-pay | Admitting: Radiology

## 2022-06-25 ENCOUNTER — Ambulatory Visit: Payer: Medicaid Other | Admitting: Orthopaedic Surgery

## 2022-06-25 ENCOUNTER — Encounter: Payer: Self-pay | Admitting: Orthopaedic Surgery

## 2022-06-25 VITALS — BP 99/61 | HR 80 | Ht 67.0 in | Wt 142.0 lb

## 2022-06-25 DIAGNOSIS — G8929 Other chronic pain: Secondary | ICD-10-CM | POA: Diagnosis not present

## 2022-06-25 DIAGNOSIS — M545 Low back pain, unspecified: Secondary | ICD-10-CM

## 2022-06-25 DIAGNOSIS — F17210 Nicotine dependence, cigarettes, uncomplicated: Secondary | ICD-10-CM | POA: Diagnosis not present

## 2022-06-25 MED ORDER — HYDROCODONE-ACETAMINOPHEN 5-325 MG PO TABS: 1.0000 | ORAL_TABLET | Freq: Four times a day (QID) | ORAL | 0 refills | Status: DC | PRN

## 2022-06-25 NOTE — Progress Notes (Signed)
My back is tender at times.  She has had some increased pain in the lower back with the weather changes but no new trauma.  She is active and doing her exercises.  She is taking her medicine.  Spine/Pelvis examination:  Inspection:  Overall, sacoiliac joint benign and hips nontender; without crepitus or defects.   Thoracic spine inspection: Alignment normal without kyphosis present   Lumbar spine inspection:  Alignment  with normal lumbar lordosis, without scoliosis apparent.   Thoracic spine palpation:  without tenderness of spinal processes   Lumbar spine palpation: without tenderness of lumbar area; without tightness of lumbar muscles    Range of Motion:   Lumbar flexion, forward flexion is normal without pain or tenderness    Lumbar extension is full without pain or tenderness   Left lateral bend is normal without pain or tenderness   Right lateral bend is normal without pain or tenderness   Straight leg raising is normal  Strength & tone: normal   Stability overall normal stability  Encounter Diagnoses  Name Primary?   Chronic midline low back pain without sciatica Yes   Cigarette nicotine dependence without complication    I have reviewed the [place] web site prior to prescribing narcotic medicine for this patient.  Return in three months.  Call if any problem.  Precautions discussed.  Electronically Signed Sanjuana Kava, MD 3/12/20248:33 AM

## 2022-06-27 ENCOUNTER — Ambulatory Visit: Payer: Medicaid Other | Admitting: Orthopaedic Surgery

## 2022-07-23 ENCOUNTER — Telehealth: Payer: Self-pay | Admitting: Orthopaedic Surgery

## 2022-07-23 MED ORDER — HYDROCODONE-ACETAMINOPHEN 5-325 MG PO TABS: 1.0000 | ORAL_TABLET | Freq: Four times a day (QID) | ORAL | 0 refills | Status: DC | PRN

## 2022-08-20 ENCOUNTER — Telehealth: Payer: Self-pay | Admitting: Orthopaedic Surgery

## 2022-08-20 MED ORDER — HYDROCODONE-ACETAMINOPHEN 5-325 MG PO TABS: 1.0000 | ORAL_TABLET | Freq: Four times a day (QID) | ORAL | 0 refills | Status: DC | PRN

## 2022-09-25 ENCOUNTER — Encounter: Payer: Self-pay | Admitting: Orthopaedic Surgery

## 2022-09-25 ENCOUNTER — Ambulatory Visit: Payer: Medicaid Other | Admitting: Orthopaedic Surgery

## 2022-09-25 VITALS — BP 118/58 | HR 64 | Ht 67.0 in | Wt 142.0 lb

## 2022-09-25 DIAGNOSIS — F17210 Nicotine dependence, cigarettes, uncomplicated: Secondary | ICD-10-CM

## 2022-09-25 DIAGNOSIS — G8929 Other chronic pain: Secondary | ICD-10-CM | POA: Diagnosis not present

## 2022-09-25 DIAGNOSIS — M545 Low back pain, unspecified: Secondary | ICD-10-CM

## 2022-09-25 MED ORDER — HYDROCODONE-ACETAMINOPHEN 5-325 MG PO TABS: 1.0000 | ORAL_TABLET | Freq: Four times a day (QID) | ORAL | 0 refills | Status: DC | PRN

## 2022-09-25 NOTE — Progress Notes (Signed)
I am feeling better today.  She has lower back pain with good and bad days.  She usually has less pain in the summer.  She is active and taking her medicine and doing her exercises. She has no new trauma, no weakness.  Spine/Pelvis examination:  Inspection:  Overall, sacoiliac joint benign and hips nontender; without crepitus or defects.   Thoracic spine inspection: Alignment normal without kyphosis present   Lumbar spine inspection:  Alignment  with normal lumbar lordosis, without scoliosis apparent.   Thoracic spine palpation:  without tenderness of spinal processes   Lumbar spine palpation: without tenderness of lumbar area; without tightness of lumbar muscles    Range of Motion:   Lumbar flexion, forward flexion is normal without pain or tenderness    Lumbar extension is full without pain or tenderness   Left lateral bend is normal without pain or tenderness   Right lateral bend is normal without pain or tenderness   Straight leg raising is normal  Strength & tone: normal   Stability overall normal stability  Encounter Diagnoses  Name Primary?   Chronic midline low back pain without sciatica Yes   Cigarette nicotine dependence without complication    I have reviewed the [place] Controlled Substance Reporting System web site prior to prescribing narcotic medicine for this patient.  Return in three months.  Call if any problem.  Precautions discussed.  Electronically Signed Darreld Mclean, MD 6/12/20248:41 AM

## 2022-09-30 ENCOUNTER — Telehealth: Payer: Self-pay

## 2022-09-30 MED ORDER — HYDROCODONE-ACETAMINOPHEN 5-325 MG PO TABS: 1.0000 | ORAL_TABLET | Freq: Four times a day (QID) | ORAL | 0 refills | Status: DC | PRN

## 2022-10-08 NOTE — Telephone Encounter (Signed)
Order placed

## 2022-10-22 ENCOUNTER — Telehealth: Payer: Self-pay | Admitting: Orthopaedic Surgery

## 2022-10-23 MED ORDER — HYDROCODONE-ACETAMINOPHEN 5-325 MG PO TABS: 1.0000 | ORAL_TABLET | Freq: Four times a day (QID) | ORAL | 0 refills | Status: DC | PRN

## 2022-10-24 ENCOUNTER — Other Ambulatory Visit: Payer: Self-pay

## 2022-11-20 ENCOUNTER — Telehealth: Payer: Self-pay | Admitting: Orthopaedic Surgery

## 2022-11-20 MED ORDER — HYDROCODONE-ACETAMINOPHEN 5-325 MG PO TABS: 1.0000 | ORAL_TABLET | Freq: Four times a day (QID) | ORAL | 0 refills | Status: DC | PRN

## 2022-11-25 ENCOUNTER — Telehealth: Payer: Self-pay | Admitting: Orthopaedic Surgery

## 2022-11-25 NOTE — Telephone Encounter (Signed)
Dr. Sanjuan Dame pt - pt lvm stating that the pharmacy is needing a prior auth for Medicaid in order for her to get her medication. [phone]

## 2022-11-27 NOTE — Telephone Encounter (Signed)
Dr. Sanjuan Dame pt - pt lvm stating this was her third call to get her prior auth for her medications, she would like to know the status of her medication and would like a call back [phone]

## 2022-12-25 ENCOUNTER — Ambulatory Visit: Payer: Medicaid Other | Admitting: Orthopaedic Surgery

## 2023-01-08 ENCOUNTER — Encounter: Payer: Self-pay | Admitting: Orthopaedic Surgery

## 2023-01-08 ENCOUNTER — Ambulatory Visit: Payer: Medicaid Other | Admitting: Orthopaedic Surgery

## 2023-01-08 VITALS — BP 105/65 | HR 61 | Ht 67.0 in | Wt 142.0 lb

## 2023-01-08 DIAGNOSIS — F17210 Nicotine dependence, cigarettes, uncomplicated: Secondary | ICD-10-CM | POA: Diagnosis not present

## 2023-01-08 DIAGNOSIS — M545 Low back pain, unspecified: Secondary | ICD-10-CM | POA: Diagnosis not present

## 2023-01-08 DIAGNOSIS — G8929 Other chronic pain: Secondary | ICD-10-CM | POA: Diagnosis not present

## 2023-01-08 MED ORDER — HYDROCODONE-ACETAMINOPHEN 5-325 MG PO TABS: 1.0000 | ORAL_TABLET | Freq: Four times a day (QID) | ORAL | 0 refills | Status: DC | PRN

## 2023-01-08 NOTE — Progress Notes (Signed)
My back hurts   She has good and bad days of the lumbar spine area with no recent trauma.  She is doing her exercises and staying active.  She has no weakness.  Lower back is not tender today with very good ROM.  NV intact.  Muscle tone and strength normal.  Gait normal.  Encounter Diagnoses  Name Primary?   Chronic midline low back pain without sciatica Yes   Cigarette nicotine dependence without complication    I have reviewed the [place] Controlled Substance Reporting System web site prior to prescribing narcotic medicine for this patient.  Continue exercises.  Continue present medications.  Return in three months.  Call if any problem.  Precautions discussed.  Electronically Signed Darreld Mclean, MD 9/25/20248:19 AM

## 2023-01-22 ENCOUNTER — Telehealth: Payer: Self-pay

## 2023-01-22 NOTE — Telephone Encounter (Signed)
Pharmacy will only cover 20 tabs

## 2023-01-23 MED ORDER — HYDROCODONE-ACETAMINOPHEN 5-325 MG PO TABS: 1.0000 | ORAL_TABLET | Freq: Four times a day (QID) | ORAL | 0 refills | Status: DC | PRN

## 2023-02-07 ENCOUNTER — Telehealth: Payer: Self-pay | Admitting: Orthopaedic Surgery

## 2023-02-07 MED ORDER — HYDROCODONE-ACETAMINOPHEN 5-325 MG PO TABS: 1.0000 | ORAL_TABLET | Freq: Four times a day (QID) | ORAL | 0 refills | Status: DC | PRN

## 2023-03-06 ENCOUNTER — Telehealth: Payer: Self-pay | Admitting: Orthopaedic Surgery

## 2023-03-06 MED ORDER — HYDROCODONE-ACETAMINOPHEN 5-325 MG PO TABS: 1.0000 | ORAL_TABLET | Freq: Four times a day (QID) | ORAL | 0 refills | Status: DC | PRN

## 2023-03-20 ENCOUNTER — Telehealth: Payer: Self-pay | Admitting: Orthopaedic Surgery

## 2023-03-21 MED ORDER — HYDROCODONE-ACETAMINOPHEN 5-325 MG PO TABS: 1.0000 | ORAL_TABLET | Freq: Four times a day (QID) | ORAL | 0 refills | Status: DC | PRN

## 2023-04-01 ENCOUNTER — Ambulatory Visit: Payer: Medicaid Other | Admitting: Orthopaedic Surgery

## 2023-04-02 ENCOUNTER — Ambulatory Visit: Payer: Medicaid Other | Admitting: Orthopaedic Surgery

## 2023-04-03 ENCOUNTER — Ambulatory Visit: Payer: Medicaid Other | Admitting: Orthopaedic Surgery

## 2023-04-03 ENCOUNTER — Encounter: Payer: Self-pay | Admitting: Orthopaedic Surgery

## 2023-04-03 VITALS — BP 96/59 | HR 59 | Ht 67.0 in | Wt 142.0 lb

## 2023-04-03 DIAGNOSIS — F17210 Nicotine dependence, cigarettes, uncomplicated: Secondary | ICD-10-CM | POA: Diagnosis not present

## 2023-04-03 DIAGNOSIS — G8929 Other chronic pain: Secondary | ICD-10-CM | POA: Diagnosis not present

## 2023-04-03 DIAGNOSIS — M545 Low back pain, unspecified: Secondary | ICD-10-CM | POA: Diagnosis not present

## 2023-04-03 MED ORDER — HYDROCODONE-ACETAMINOPHEN 5-325 MG PO TABS: 1.0000 | ORAL_TABLET | Freq: Four times a day (QID) | ORAL | 0 refills | Status: DC | PRN

## 2023-04-03 NOTE — Progress Notes (Signed)
My back is tender.  She has more pain in the lower back with colder weather.  She has no new trauma, no weakness.  She is doing her exercises and taking her medicine.  Spine/Pelvis examination:  Inspection:  Overall, sacoiliac joint benign and hips nontender; without crepitus or defects.   Thoracic spine inspection: Alignment normal without kyphosis present   Lumbar spine inspection:  Alignment  with normal lumbar lordosis, without scoliosis apparent.   Thoracic spine palpation:  without tenderness of spinal processes   Lumbar spine palpation: without tenderness of lumbar area; without tightness of lumbar muscles    Range of Motion:   Lumbar flexion, forward flexion is normal without pain or tenderness    Lumbar extension is full without pain or tenderness   Left lateral bend is normal without pain or tenderness   Right lateral bend is normal without pain or tenderness   Straight leg raising is normal  Strength & tone: normal   Stability overall normal stability  Encounter Diagnoses  Name Primary?   Chronic midline low back pain without sciatica Yes   Cigarette nicotine dependence without complication    I have reviewed the [place] Controlled Substance Reporting System web site prior to prescribing narcotic medicine for this patient.  Return in three months.  Call if any problem.  Precautions discussed.  Electronically Signed Darreld Mclean, MD 12/19/20248:34 AM

## 2023-04-17 ENCOUNTER — Other Ambulatory Visit: Payer: Self-pay | Admitting: Orthopaedic Surgery

## 2023-04-18 MED ORDER — HYDROCODONE-ACETAMINOPHEN 5-325 MG PO TABS: 1.0000 | ORAL_TABLET | Freq: Four times a day (QID) | ORAL | 0 refills | Status: DC | PRN

## 2023-04-29 ENCOUNTER — Telehealth: Payer: Self-pay | Admitting: Orthopedic Surgery

## 2023-04-29 MED ORDER — HYDROCODONE-ACETAMINOPHEN 5-325 MG PO TABS: 1.0000 | ORAL_TABLET | Freq: Four times a day (QID) | ORAL | 0 refills | Status: DC | PRN

## 2023-05-14 ENCOUNTER — Telehealth: Payer: Self-pay | Admitting: Orthopaedic Surgery

## 2023-05-15 MED ORDER — HYDROCODONE-ACETAMINOPHEN 5-325 MG PO TABS: 1.0000 | ORAL_TABLET | Freq: Four times a day (QID) | ORAL | 0 refills | Status: DC | PRN

## 2023-06-02 ENCOUNTER — Telehealth: Payer: Self-pay | Admitting: Orthopaedic Surgery

## 2023-06-03 MED ORDER — HYDROCODONE-ACETAMINOPHEN 5-325 MG PO TABS: 1.0000 | ORAL_TABLET | Freq: Four times a day (QID) | ORAL | 0 refills | Status: DC | PRN

## 2023-06-17 ENCOUNTER — Telehealth: Payer: Self-pay | Admitting: Orthopaedic Surgery

## 2023-06-18 MED ORDER — HYDROCODONE-ACETAMINOPHEN 5-325 MG PO TABS: 1.0000 | ORAL_TABLET | Freq: Four times a day (QID) | ORAL | 0 refills | Status: DC | PRN

## 2023-06-26 ENCOUNTER — Encounter: Payer: Self-pay | Admitting: Orthopaedic Surgery

## 2023-06-26 ENCOUNTER — Ambulatory Visit: Admitting: Orthopaedic Surgery

## 2023-06-26 VITALS — BP 95/59 | HR 66 | Ht 67.0 in | Wt 141.0 lb

## 2023-06-26 DIAGNOSIS — G8929 Other chronic pain: Secondary | ICD-10-CM | POA: Diagnosis not present

## 2023-06-26 DIAGNOSIS — M545 Low back pain, unspecified: Secondary | ICD-10-CM

## 2023-06-26 DIAGNOSIS — F17210 Nicotine dependence, cigarettes, uncomplicated: Secondary | ICD-10-CM | POA: Diagnosis not present

## 2023-06-26 MED ORDER — HYDROCODONE-ACETAMINOPHEN 5-325 MG PO TABS: 1.0000 | ORAL_TABLET | Freq: Four times a day (QID) | ORAL | 0 refills | Status: DC | PRN

## 2023-06-26 NOTE — Progress Notes (Signed)
 My back is tender.  She continues to have good and bad days with her lower back.  Cold and rainy days are the worst.  She has no new trauma, no weakness, no numbness.  Her back has good ROM today, no spasm, NV intact, muscle tone and strength normal.  Encounter Diagnoses  Name Primary?   Chronic midline low back pain without sciatica Yes   Cigarette nicotine dependence without complication    I have reviewed the [place] Controlled Substance Reporting System web site prior to prescribing narcotic medicine for this patient.  Return in three months.  Call if any problem.  Precautions discussed.  Electronically Signed Darreld Mclean, MD 3/13/20258:41 AM

## 2023-07-03 ENCOUNTER — Ambulatory Visit: Payer: Medicaid Other | Admitting: Orthopaedic Surgery

## 2023-07-14 ENCOUNTER — Telehealth: Payer: Self-pay | Admitting: Orthopaedic Surgery

## 2023-07-14 MED ORDER — HYDROCODONE-ACETAMINOPHEN 5-325 MG PO TABS: 1.0000 | ORAL_TABLET | Freq: Four times a day (QID) | ORAL | 0 refills | Status: DC | PRN

## 2023-07-23 ENCOUNTER — Ambulatory Visit: Admitting: Orthopaedic Surgery

## 2023-07-30 ENCOUNTER — Telehealth: Payer: Self-pay | Admitting: Orthopaedic Surgery

## 2023-07-30 MED ORDER — HYDROCODONE-ACETAMINOPHEN 5-325 MG PO TABS: 1.0000 | ORAL_TABLET | Freq: Four times a day (QID) | ORAL | 0 refills | Status: DC | PRN

## 2023-08-13 ENCOUNTER — Telehealth: Payer: Self-pay | Admitting: Orthopaedic Surgery

## 2023-08-13 MED ORDER — HYDROCODONE-ACETAMINOPHEN 5-325 MG PO TABS: 1.0000 | ORAL_TABLET | Freq: Four times a day (QID) | ORAL | 0 refills | Status: DC | PRN

## 2023-08-26 ENCOUNTER — Telehealth: Payer: Self-pay | Admitting: Orthopaedic Surgery

## 2023-08-26 MED ORDER — HYDROCODONE-ACETAMINOPHEN 5-325 MG PO TABS
1.0000 | ORAL_TABLET | Freq: Four times a day (QID) | ORAL | 0 refills | Status: DC | PRN
Start: 2023-08-26 — End: 2023-09-17

## 2023-09-17 ENCOUNTER — Telehealth: Payer: Self-pay

## 2023-09-17 MED ORDER — HYDROCODONE-ACETAMINOPHEN 5-325 MG PO TABS: 1.0000 | ORAL_TABLET | Freq: Four times a day (QID) | ORAL | 0 refills | Status: DC | PRN

## 2023-09-17 NOTE — Telephone Encounter (Signed)
Hydrocodone-Acetaminophen 5/325 MG Qty 20 Tablets  PATIENT USES [facility]

## 2023-10-01 ENCOUNTER — Encounter: Payer: Self-pay | Admitting: Orthopaedic Surgery

## 2023-10-01 ENCOUNTER — Ambulatory Visit: Admitting: Orthopaedic Surgery

## 2023-10-01 DIAGNOSIS — M545 Low back pain, unspecified: Secondary | ICD-10-CM

## 2023-10-01 DIAGNOSIS — G8929 Other chronic pain: Secondary | ICD-10-CM | POA: Diagnosis not present

## 2023-10-01 MED ORDER — HYDROCODONE-ACETAMINOPHEN 5-325 MG PO TABS: 1.0000 | ORAL_TABLET | Freq: Four times a day (QID) | ORAL | 0 refills | Status: DC | PRN

## 2023-10-01 NOTE — Progress Notes (Signed)
 My back has its moments.  She has good and bad days with her back but more good with the warmer weather.  She has no new trauma.  She is active and doing her exercises.  Spine/Pelvis examination:  Inspection:  Overall, sacoiliac joint benign and hips nontender; without crepitus or defects.   Thoracic spine inspection: Alignment normal without kyphosis present   Lumbar spine inspection:  Alignment  with normal lumbar lordosis, without scoliosis apparent.   Thoracic spine palpation:  without tenderness of spinal processes   Lumbar spine palpation: with tenderness of lumbar area; without tightness of lumbar muscles    Range of Motion:   Lumbar flexion, forward flexion is full without pain or tenderness    Lumbar extension is full without pain or tenderness   Left lateral bend is Normal  without pain or tenderness   Right lateral bend is Normal without pain or tenderness   Straight leg raising is Normal   Strength & tone: Normal   Stability overall normal stability    Encounter Diagnosis  Name Primary?   Chronic midline low back pain without sciatica Yes   I have reviewed the [place]  Controlled Substance Reporting System web site prior to prescribing narcotic medicine for this patient.  Return in six weeks.  Continue exercises.  Call if any problem.  Precautions discussed.  Electronically Signed Pleasant Brilliant, MD 6/18/20258:22 AM

## 2023-10-01 NOTE — Patient Instructions (Signed)
 Steps to Quit Smoking Smoking tobacco is the leading cause of preventable death. It can affect almost every organ in the body. Smoking puts you and people around you at risk for many serious, long-lasting (chronic) diseases. Quitting smoking can be hard, but it is one of the best things that you can do for your health. It is never too late to quit. Do not give up if you cannot quit the first time. Some people need to try many times to quit. Do your best to stick to your quit plan, and talk with your doctor if you have any questions or concerns. How do I get ready to quit? Pick a date to quit. Set a date within the next 2 weeks to give you time to prepare. Write down the reasons why you are quitting. Keep this list in places where you will see it often. Tell your family, friends, and co-workers that you are quitting. Their support is important. Talk with your doctor about the choices that may help you quit. Find out if your health insurance will pay for these treatments. Know the people, places, things, and activities that make you want to smoke (triggers). Avoid them. What first steps can I take to quit smoking? Throw away all cigarettes at home, at work, and in your car. Throw away the things that you use when you smoke, such as ashtrays and lighters. Clean your car. Empty the ashtray. Clean your home, including curtains and carpets. What can I do to help me quit smoking? Talk with your doctor about taking medicines and seeing a counselor. You are more likely to succeed when you do both. If you are pregnant or breastfeeding: Talk with your doctor about counseling or other ways to quit smoking. Do not take medicine to help you quit smoking unless your doctor tells you to. Quit right away Quit smoking completely, instead of slowly cutting back on how much you smoke over a period of time. Stopping smoking right away may be more successful than slowly quitting. Go to counseling. In-person is best  if this is an option. You are more likely to quit if you go to counseling sessions regularly. Take medicine You may take medicines to help you quit. Some medicines need a prescription, and some you can buy over-the-counter. Some medicines may contain a drug called nicotine to replace the nicotine in cigarettes. Medicines may: Help you stop having the desire to smoke (cravings). Help to stop the problems that come when you stop smoking (withdrawal symptoms). Your doctor may ask you to use: Nicotine patches, gum, or lozenges. Nicotine inhalers or sprays. Non-nicotine medicine that you take by mouth. Find resources Find resources and other ways to help you quit smoking and remain smoke-free after you quit. They include: Online chats with a Veterinary surgeon. Phone quitlines. Printed Materials engineer. Support groups or group counseling. Text messaging programs. Mobile phone apps. Use apps on your mobile phone or tablet that can help you stick to your quit plan. Examples of free services include Quit Guide from the CDC and smokefree.gov  What can I do to make it easier to quit?  Talk to your family and friends. Ask them to support and encourage you. Call a phone quitline, such as 1-800-QUIT-NOW, reach out to support groups, or work with a Veterinary surgeon. Ask people who smoke to not smoke around you. Avoid places that make you want to smoke, such as: Bars. Parties. Smoke-break areas at work. Spend time with people who do not smoke. Lower  the stress in your life. Stress can make you want to smoke. Try these things to lower stress: Getting regular exercise. Doing deep-breathing exercises. Doing yoga. Meditating. What benefits will I see if I quit smoking? Over time, you may have: A better sense of smell and taste. Less coughing and sore throat. A slower heart rate. Lower blood pressure. Clearer skin. Better breathing. Fewer sick days. Summary Quitting smoking can be hard, but it is one of  the best things that you can do for your health. Do not give up if you cannot quit the first time. Some people need to try many times to quit. When you decide to quit smoking, make a plan to help you succeed. Quit smoking right away, not slowly over a period of time. When you start quitting, get help and support to keep you smoke-free. This information is not intended to replace advice given to you by your health care provider. Make sure you discuss any questions you have with your health care provider. Document Revised: 03/23/2021 Document Reviewed: 03/23/2021 Elsevier Patient Education  2024 [employer].

## 2023-10-02 ENCOUNTER — Ambulatory Visit: Admitting: Orthopaedic Surgery

## 2023-10-14 ENCOUNTER — Telehealth: Payer: Self-pay | Admitting: Orthopaedic Surgery

## 2023-10-14 MED ORDER — HYDROCODONE-ACETAMINOPHEN 5-325 MG PO TABS: 1.0000 | ORAL_TABLET | Freq: Four times a day (QID) | ORAL | 0 refills | Status: DC | PRN

## 2023-10-14 NOTE — Telephone Encounter (Signed)
 DR. BRENNA   Patient called wants refill on    HYDROcodone -acetaminophen  (NORCO/VICODIN) 5-325 MG tablet    Pharmacy:  [facility] in [place]

## 2023-10-29 MED ORDER — HYDROCODONE-ACETAMINOPHEN 5-325 MG PO TABS: 1.0000 | ORAL_TABLET | Freq: Four times a day (QID) | ORAL | 0 refills | Status: DC | PRN

## 2023-10-29 NOTE — Telephone Encounter (Signed)
Hydrocodone-Acetaminophen 5/325 MG Qty 20 Tablets  PATIENT USES [facility]

## 2023-11-05 ENCOUNTER — Telehealth: Payer: Self-pay | Admitting: Orthopaedic Surgery

## 2023-11-05 MED ORDER — HYDROCODONE-ACETAMINOPHEN 5-325 MG PO TABS: 1.0000 | ORAL_TABLET | Freq: Four times a day (QID) | ORAL | 0 refills | Status: DC | PRN

## 2023-11-05 NOTE — Telephone Encounter (Signed)
 Spoke with pt and she is understanding reason why needs to contact us  to get refill.

## 2023-11-05 NOTE — Telephone Encounter (Signed)
 Dr. Janae pt - pt lvm stating she would like to speak to someone to explain what the pharmacy said about her medication.  [phone]

## 2023-11-24 ENCOUNTER — Telehealth: Payer: Self-pay | Admitting: Orthopaedic Surgery

## 2023-11-24 MED ORDER — HYDROCODONE-ACETAMINOPHEN 5-325 MG PO TABS: 1.0000 | ORAL_TABLET | Freq: Four times a day (QID) | ORAL | 0 refills | Status: DC | PRN

## 2023-11-24 NOTE — Telephone Encounter (Signed)
 Dr. Janae pt - pt lvm requesting a refill for Hydrocodone  5-325 to be sent to [facility]

## 2023-12-02 ENCOUNTER — Telehealth: Payer: Self-pay | Admitting: Orthopaedic Surgery

## 2023-12-02 MED ORDER — HYDROCODONE-ACETAMINOPHEN 5-325 MG PO TABS
1.0000 | ORAL_TABLET | Freq: Four times a day (QID) | ORAL | 0 refills | Status: DC | PRN
Start: 2023-12-02 — End: 2023-12-09

## 2023-12-02 NOTE — Telephone Encounter (Signed)
 Dr. Janae pt - spoke w/the pt, she is requesting a refill for Hydrocodone  5-325 to be sent to [facility]

## 2023-12-05 ENCOUNTER — Encounter: Payer: Self-pay | Admitting: Radiology

## 2023-12-09 ENCOUNTER — Telehealth: Payer: Self-pay | Admitting: Orthopaedic Surgery

## 2023-12-09 MED ORDER — HYDROCODONE-ACETAMINOPHEN 5-325 MG PO TABS: 1.0000 | ORAL_TABLET | Freq: Four times a day (QID) | ORAL | 0 refills | Status: DC | PRN

## 2023-12-09 NOTE — Telephone Encounter (Signed)
 Dr. Janae pt - spoke w/the pt, she is requesting a refill for Hydrocodone  5-325 to be sent to [facility]

## 2023-12-16 ENCOUNTER — Telehealth: Payer: Self-pay | Admitting: Orthopaedic Surgery

## 2023-12-16 NOTE — Telephone Encounter (Signed)
 Dr. Janae pt - pt lvm requesting a refill for Hydrocodone  5-325 to be sent to [facility]

## 2023-12-17 MED ORDER — HYDROCODONE-ACETAMINOPHEN 5-325 MG PO TABS: 1.0000 | ORAL_TABLET | Freq: Four times a day (QID) | ORAL | 0 refills | Status: DC | PRN

## 2023-12-24 ENCOUNTER — Encounter: Payer: Self-pay | Admitting: Orthopaedic Surgery

## 2023-12-24 ENCOUNTER — Ambulatory Visit: Admitting: Orthopaedic Surgery

## 2023-12-24 DIAGNOSIS — M545 Low back pain, unspecified: Secondary | ICD-10-CM

## 2023-12-24 DIAGNOSIS — F17210 Nicotine dependence, cigarettes, uncomplicated: Secondary | ICD-10-CM | POA: Diagnosis not present

## 2023-12-24 DIAGNOSIS — G8929 Other chronic pain: Secondary | ICD-10-CM

## 2023-12-24 MED ORDER — HYDROCODONE-ACETAMINOPHEN 5-325 MG PO TABS: 1.0000 | ORAL_TABLET | Freq: Four times a day (QID) | ORAL | 0 refills | Status: DC | PRN

## 2023-12-24 NOTE — Progress Notes (Signed)
 I am better today.  She has good and bad days.  Today is a good day.  She has minimal back pain today.  She has no weakness.  Spine/Pelvis examination:  Inspection:  Overall, sacoiliac joint benign and hips nontender; without crepitus or defects.   Thoracic spine inspection: Alignment normal without kyphosis present   Lumbar spine inspection:  Alignment  with normal lumbar lordosis, without scoliosis apparent.   Thoracic spine palpation:  without tenderness of spinal processes   Lumbar spine palpation: without tenderness of lumbar area; without tightness of lumbar muscles    Range of Motion:   Lumbar flexion, forward flexion is normal without pain or tenderness    Lumbar extension is full without pain or tenderness   Left lateral bend is normal without pain or tenderness   Right lateral bend is normal without pain or tenderness   Straight leg raising is normal  Strength & tone: normal   Stability overall normal stability  Encounter Diagnoses  Name Primary?   Chronic midline low back pain without sciatica Yes   Cigarette nicotine  dependence without complication    I have informed the patient I will be retiring from medical practice and from this office on January 15, 2024.  The patient has been offered continuing care with Dr. Margrette or Dr. Onesimo of this office.  The patient may choose another provider and the records will be forwarded after proper signature and notification.  Patient understands and agrees.  I have reviewed the   Controlled Substance Reporting System web site prior to prescribing narcotic medicine for this patient.  Call if any problem.  Precautions discussed.  Return in six weeks.  Electronically Signed Lemond Stable, MD 9/10/202510:35 AM

## 2023-12-31 ENCOUNTER — Telehealth: Payer: Self-pay | Admitting: Orthopaedic Surgery

## 2023-12-31 ENCOUNTER — Ambulatory Visit: Admitting: Orthopaedic Surgery

## 2023-12-31 NOTE — Telephone Encounter (Signed)
 Dr. Janae pt - spoke w/the pt, she is requesting a refill for Hydrocodone  5-325 to be sent to [facility]

## 2024-01-02 MED ORDER — HYDROCODONE-ACETAMINOPHEN 5-325 MG PO TABS: 1.0000 | ORAL_TABLET | Freq: Four times a day (QID) | ORAL | 0 refills | Status: DC | PRN

## 2024-01-09 ENCOUNTER — Telehealth: Payer: Self-pay | Admitting: Orthopaedic Surgery

## 2024-01-09 NOTE — Telephone Encounter (Signed)
 DR. BRENNA   Patient wants refill on her pain medicine   HYDROcodone -acetaminophen  (NORCO/VICODIN) 5-325 MG tablet   Pharmacy:  [facility] in [place]

## 2024-01-12 MED ORDER — HYDROCODONE-ACETAMINOPHEN 5-325 MG PO TABS: 1.0000 | ORAL_TABLET | Freq: Four times a day (QID) | ORAL | 0 refills | Status: DC | PRN

## 2024-02-04 ENCOUNTER — Telehealth: Payer: Self-pay | Admitting: Orthopedic Surgery

## 2024-02-04 ENCOUNTER — Other Ambulatory Visit (INDEPENDENT_AMBULATORY_CARE_PROVIDER_SITE_OTHER)

## 2024-02-04 ENCOUNTER — Encounter: Payer: Self-pay | Admitting: Orthopedic Surgery

## 2024-02-04 ENCOUNTER — Ambulatory Visit: Admitting: Orthopedic Surgery

## 2024-02-04 DIAGNOSIS — M545 Low back pain, unspecified: Secondary | ICD-10-CM

## 2024-02-04 DIAGNOSIS — G8929 Other chronic pain: Secondary | ICD-10-CM

## 2024-02-04 MED ORDER — HYDROCODONE-ACETAMINOPHEN 5-325 MG PO TABS: 1.0000 | ORAL_TABLET | Freq: Four times a day (QID) | ORAL | 0 refills | Status: DC | PRN

## 2024-02-04 MED ORDER — TIZANIDINE HCL 4 MG PO TABS: 4.0000 mg | ORAL_TABLET | Freq: Three times a day (TID) | ORAL | 1 refills | Status: AC | PRN

## 2024-02-04 NOTE — Progress Notes (Signed)
 Chief Complaint  Patient presents with   Back Pain    Lumbar     58 year old female who was seen by Dr. Brenna for chronic lower back pain and opioid management  The patient indicates that she fell 8 years ago on some ice land on her lower back.  She never was able to get pain-free and has been seeing Dr. Brenna every 3 months to be reevaluated for ongoing opioid management and she gets 1 week refill on hydrocodone  5 mg  She takes 1 in the morning and 1 in the evening and has intermittent symptoms exacerbated by cold weather or standing for long periods of time or different things she may do at work  Review of systems reveals no bowel or bladder dysfunction or numbness or tingling in the legs all the pain is located in the lower back  Past Medical History:  Diagnosis Date   Depression    ETOH abuse    Hypertension    Renal disorder    Past Surgical History:  Procedure Laterality Date   BREAST SURGERY     cyst removal on breast     DILITATION & CURRETTAGE/HYSTROSCOPY WITH NOVASURE ABLATION N/A 02/27/2016   Procedure: DILATATION & CURETTAGE/HYSTEROSCOPY  Procedure #2;  Surgeon: Norleen LULLA Server, MD;  Location: [facility];  Service: Gynecology;  Laterality: N/A;   LAPAROSCOPIC BILATERAL SALPINGECTOMY N/A 02/27/2016   Procedure: LAPAROSCOPIC BILATERAL SALPINGECTOMY; LAPAROSCOPIC LYSIS OF ADHESIONS Procedure #1;  Surgeon: Norleen LULLA Server, MD;  Location: [facility];  Service: Gynecology;  Laterality: N/A;    Physical findings include the following  The patient's parents was normal her BMI is normal she has a got excellent body weight  She is awake and alert  She is oriented x 3  Her mood and affect are pleasant  She does have tenderness in her lower back Her pain increases with extension She can force flex down to just above her toes  Tiptoe walk test was normal  DG Lumbar Spine 2-3 Views Result Date: 02/04/2024 Patient with history of lower back pain Taking over care from  my prior provider no recent x-rays Spinal imaging: 3 images of the lumbar spine the patient's plumb line is to the left pelvic heights look a little off balance with the left side being higher lateral x-ray shows a mild spondylolisthesis of L4 on L5 with anterior superior endplate irregularities at L5 L3 and L2 disc spaces look fairly preserved in the lumbar area. Mild facet arthritis L5-S1 Impression Grade 1 spondylolisthesis of L4 on L5 Mild spondylosis     Encounter Diagnoses  Name Primary?   Chronic midline low back pain without sciatica Yes   Encounter for chronic pain management    Recommendations are for physical therapy Add a muscle relaxer Refill her hydrocodone  for the next 30 days Pain management referral  Meds ordered this encounter  Medications   tiZANidine (ZANAFLEX) 4 MG tablet    Sig: Take 1 tablet (4 mg total) by mouth every 8 (eight) hours as needed for muscle spasms.    Dispense:  30 tablet    Refill:  1   HYDROcodone -acetaminophen  (NORCO/VICODIN) 5-325 MG tablet    Sig: Take 1 tablet by mouth every 6 (six) hours as needed for moderate pain (pain score 4-6).    Dispense:  18 tablet    Refill:  0    Instructions regarding chronic pain management   You have chronic pain.  You are on chronic opioid therapy.  You will be referred to a chronic pain chronic opioid therapy specialist.  You have 30 days from today to continue getting your medications from this office.  After 30 days you would no longer get prescriptions for opioids from [facility].

## 2024-02-04 NOTE — Telephone Encounter (Signed)
 We did not send anything to [facility], dont know who that is

## 2024-02-04 NOTE — Telephone Encounter (Signed)
 Dr. Areatha pt - Morna w/[facility] called and stated they got a referral for this pt to see Dr. Carol and they don't have a Dr. Carol there.  She thinks the referral came to them in error.  [phone]

## 2024-02-04 NOTE — Progress Notes (Signed)
    02/04/2024   Chief Complaint  Patient presents with   Back Pain    Lumbar     No diagnosis found.  What pharmacy do you use ? ___________________________  DOI/DOS/ Date:    Unchanged

## 2024-02-04 NOTE — Patient Instructions (Addendum)
 [facility] [address]  [phone]  We will send referral there, you call next week to schedule, they will call you too.     Instructions regarding chronic pain management   You have chronic pain.  You are on chronic opioid therapy.  You will be referred to a chronic pain chronic opioid therapy specialist.  You have 30 days from today to continue getting your medications from this office.  After 30 days you would no longer get prescriptions for opioids from [facility].   Physical therapy has been ordered for you at [facility]. They should call you to schedule, [phone] is the phone number to call, if you want to call to schedule.

## 2024-02-11 ENCOUNTER — Other Ambulatory Visit: Payer: Self-pay | Admitting: Orthopedic Surgery

## 2024-02-11 ENCOUNTER — Telehealth: Payer: Self-pay | Admitting: Orthopedic Surgery

## 2024-02-11 DIAGNOSIS — G8929 Other chronic pain: Secondary | ICD-10-CM

## 2024-02-11 MED ORDER — HYDROCODONE-ACETAMINOPHEN 5-325 MG PO TABS: 1.0000 | ORAL_TABLET | Freq: Four times a day (QID) | ORAL | 0 refills | Status: DC | PRN

## 2024-02-11 NOTE — Progress Notes (Signed)
 Second of 4 pain medication prescriptions, patient given 30-day notice at last office visit

## 2024-02-11 NOTE — Telephone Encounter (Signed)
 Dr. Areatha pt - pt lvm requesting a refill for Hydrocodone  5-325, 18 tablets, every 6 hours PRN for moderate pain (pain score 4-6)

## 2024-02-16 ENCOUNTER — Encounter: Payer: Self-pay | Admitting: Radiology

## 2024-02-18 ENCOUNTER — Other Ambulatory Visit: Payer: Self-pay | Admitting: Orthopedic Surgery

## 2024-02-18 DIAGNOSIS — G8929 Other chronic pain: Secondary | ICD-10-CM

## 2024-02-18 NOTE — Telephone Encounter (Signed)
 Dr. Areatha pt - pt lvm requesting a refill for Hydrocodone  5-325, 18 tablets, every 6 hours PRN for moderate pain (pain score 4-6)

## 2024-02-19 MED ORDER — HYDROCODONE-ACETAMINOPHEN 5-325 MG PO TABS: 1.0000 | ORAL_TABLET | Freq: Four times a day (QID) | ORAL | 0 refills | Status: DC | PRN

## 2024-02-25 ENCOUNTER — Other Ambulatory Visit: Payer: Self-pay | Admitting: Orthopedic Surgery

## 2024-02-25 ENCOUNTER — Telehealth: Payer: Self-pay | Admitting: Orthopedic Surgery

## 2024-02-25 DIAGNOSIS — G8929 Other chronic pain: Secondary | ICD-10-CM

## 2024-02-25 MED ORDER — HYDROCODONE-ACETAMINOPHEN 5-325 MG PO TABS: 1.0000 | ORAL_TABLET | Freq: Four times a day (QID) | ORAL | 0 refills | Status: AC | PRN

## 2024-02-25 NOTE — Progress Notes (Signed)
 03/06/24 last rx   Meds ordered this encounter  Medications   HYDROcodone -acetaminophen  (NORCO/VICODIN) 5-325 MG tablet    Sig: Take 1 tablet by mouth every 6 (six) hours as needed for moderate pain (pain score 4-6).    Dispense:  18 tablet    Refill:  0    Instructions regarding chronic pain management   You have chronic pain.  You are on chronic opioid therapy.  You will be referred to a chronic pain chronic opioid therapy specialist.  You have 30 days from today to continue getting your medications from this office.  After 30 days you would no longer get prescriptions for opioids from [facility].

## 2024-02-25 NOTE — Telephone Encounter (Signed)
 Dr. Areatha pt - pt lvm requesting a refill for Hydrocodone  5-325, 18 tablets, every 6 hours PRN for moderate pain (pain score 4-6)

## 2024-02-26 ENCOUNTER — Telehealth: Payer: Self-pay | Admitting: Orthopedic Surgery

## 2024-02-26 NOTE — Telephone Encounter (Signed)
 Ok

## 2024-02-26 NOTE — Telephone Encounter (Addendum)
 She can get xrays on disk I was not aware she needed them   We can't get disk today [facility] will have to let her know when they are ready and they may have to do that at [facility], since we have had trouble with disk  If they need report, ok to fax while she is there/ I have just faxed it for her, that's all I can do.

## 2024-02-26 NOTE — Telephone Encounter (Signed)
 Dr. Areatha pt - pt lvm stating Dr. VEAR referred her for pain management, she is there today.  They are telling her that she should've brought her last x-rays w/her.  She stated that they are not going to prescribe her any meds w/out the x-rays.  She stated that she has been there for 2hrs and she's not leaving w/out her meds.  [phone]

## 2024-03-05 ENCOUNTER — Ambulatory Visit (HOSPITAL_COMMUNITY): Attending: Orthopedic Surgery

## 2024-03-05 ENCOUNTER — Other Ambulatory Visit: Payer: Self-pay

## 2024-03-05 DIAGNOSIS — Z7409 Other reduced mobility: Secondary | ICD-10-CM | POA: Diagnosis present

## 2024-03-05 DIAGNOSIS — G8929 Other chronic pain: Secondary | ICD-10-CM | POA: Diagnosis not present

## 2024-03-05 DIAGNOSIS — M5459 Other low back pain: Secondary | ICD-10-CM | POA: Insufficient documentation

## 2024-03-05 DIAGNOSIS — M549 Dorsalgia, unspecified: Secondary | ICD-10-CM | POA: Diagnosis present

## 2024-03-05 DIAGNOSIS — M545 Low back pain, unspecified: Secondary | ICD-10-CM | POA: Insufficient documentation

## 2024-03-05 NOTE — Therapy (Signed)
 OUTPATIENT PHYSICAL THERAPY THORACOLUMBAR EVALUATION   Patient Name: Kaylee Ochoa MRN: [id number] DOB:July 29, 1965, 58 y.o., female Today's Date: 03/05/2024  END OF SESSION:  PT End of Session - 03/05/24 1119     Visit Number 1    Number of Visits 13    Date for Recertification  04/02/24    Authorization Type [employer]    Authorization Time Period Auth requested    Progress Note Due on Visit 10    PT Start Time 1120    PT Stop Time 1202    PT Time Calculation (min) 42 min    Activity Tolerance Patient tolerated treatment well    Behavior During Therapy WFL for tasks assessed/performed          Past Medical History:  Diagnosis Date   Depression    ETOH abuse    Hypertension    Renal disorder    Past Surgical History:  Procedure Laterality Date   BREAST SURGERY     cyst removal on breast     DILITATION & CURRETTAGE/HYSTROSCOPY WITH NOVASURE ABLATION N/A 02/27/2016   Procedure: DILATATION & CURETTAGE/HYSTEROSCOPY  Procedure #2;  Surgeon: Norleen LULLA Server, MD;  Location: [facility];  Service: Gynecology;  Laterality: N/A;   LAPAROSCOPIC BILATERAL SALPINGECTOMY N/A 02/27/2016   Procedure: LAPAROSCOPIC BILATERAL SALPINGECTOMY; LAPAROSCOPIC LYSIS OF ADHESIONS Procedure #1;  Surgeon: Norleen LULLA Server, MD;  Location: [facility];  Service: Gynecology;  Laterality: N/A;   Patient Active Problem List   Diagnosis Date Noted   Chronic midline low back pain without sciatica 10/01/2016   Uterine fibroid, intramural 02/27/2016   Encounter for sterilization bilateral salpingectomy 02/27/2016   Dysmenorrhea 06/01/2015   Alcohol  dependence with alcohol -induced mood disorder (HCC)    Elevated LFTs    MDD (major depressive disorder) 09/14/2014   Bereavement 05/05/2014   Alcohol  use disorder, severe, dependence (HCC) 05/04/2014   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   SUBJECTIVE:                                                                                                                                                                                           SUBJECTIVE STATEMENT: Patient reports  she's had back pain for 8 years, fell in 2017 when she slipped on ice. MD gave her a pain medication and she's been using that over the years to manage pain. New MD has referred her to PT and changed her medications.  Reports pain feels like burning sensation, tinging, and soreness. Reports it starts at low back and creeps up to mid back. Worse with lifting, improves with laying down but still feels sore.  Reports pain starts to kick in 2 hours after getting up and going and its worse at night.   PERTINENT HISTORY:  N/A  PAIN:  Are you having pain? 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 Following up with pain clinic  OBJECTIVE:  Note: Objective measures were completed at Evaluation unless otherwise noted.  DIAGNOSTIC FINDINGS:  Spinal imaging: 3 images of the lumbar spine the patient's plumb line is to the left pelvic heights look a little off balance with the left side being higher lateral x-ray shows a mild spondylolisthesis of L4 on L5 with anterior superior endplate irregularities at L5 L3 and L2 disc spaces look fairly preserved in the lumbar area.   Mild facet arthritis L5-S1   Impression   Grade 1 spondylolisthesis of L4 on L5   Mild spondylosis  PATIENT SURVEYS:  Modified Oswestry:   Modified Oswestry Low Back Pain Disability Questionnaire: 18 / 50 = 36.0 %  Interpretation of scores: Score Category Description  0-20% Minimal Disability The patient can cope with most living activities. Usually no treatment is indicated apart from advice on lifting, sitting and exercise  21-40% Moderate Disability The patient experiences more pain and difficulty with sitting, lifting and standing. Travel and social life are more difficult and they may be disabled from work. Personal care, sexual activity and sleeping are not grossly affected, and the patient can usually be managed by conservative means  41-60% Severe Disability Pain remains the main problem in this group, but activities of daily living are affected. These patients require a detailed investigation  61-80% Crippled Back pain impinges on all aspects of the patient's life. Positive intervention is required  81-100% Bed-bound These patients are either bed-bound or exaggerating their symptoms  Bluford FORBES Zoe DELENA Karon DELENA, et al. Surgery versus conservative management of stable thoracolumbar fracture: the PRESTO feasibility RCT. [place] ([place]): [employer]; 2021 Nov. [facility] [employer] Assessment, No. 25.62.)  Appendix 3, Oswestry Disability Index category descriptors. Available from: [email or web address]  Minimally Clinically Important Difference (MCID) = 12.8%  COGNITION: Overall cognitive status: Within functional limits for tasks assessed     SENSATION: WFL Reports decreased sensation in R upper/mid thoracic region, intermittent   POSTURE:  Rounded shoulders Slightly increased thoracic kyphosis  PALPATION:  TTP in lower thoracic spine, muscle spasms in paraspinals with CPA, grade 2 Pain relief with grade 1-2 CPA throughout lumbar spine   LUMBAR ROM:   AROM Eval  Flexion WFL * at end range  Extension 50% * in mid back   Right lateral flexion ~1 inch past knee *  Left lateral flexion ~3 inch past knee  Right rotation 75% avail*  Left rotation 75% avail   (Blank rows = not tested)  *=painful  LOWER EXTREMITY ROM:     Active  Right eval Left eval  Hip flexion    Hip extension    Hip abduction    Hip adduction    Hip internal rotation    Hip external rotation    Knee flexion    Knee extension    Ankle dorsiflexion    Ankle plantarflexion    Ankle inversion    Ankle eversion     (Blank rows = not tested)  LOWER EXTREMITY MMT:    MMT Right eval Left eval  Hip flexion 4+ 4+  Hip extension 4- 4-  Hip abduction 4- 4-  Hip adduction    Hip internal rotation    Hip external rotation    Knee flexion    Knee extension    Ankle dorsiflexion 5 5  Ankle plantarflexion    Ankle inversion    Ankle eversion     (Blank rows = not tested)  LUMBAR SPECIAL TESTS:    FUNCTIONAL TESTS:  30 seconds chair stand test: 8.5 STS, pain increases around rep 6  GAIT: Distance walked: 75 ft in session Assistive device utilized: None Level of assistance: Complete Independence Comments: WFL, no significant deviations  TREATMENT DATE:  03/05/24: PT Eval and HEP                                                                                                                                  PATIENT EDUCATION:  Education details: PT evaluation, objective findings, POC, Importance of HEP, Precautions, Clinic policies, Anatomy and Physiology Person educated: Patient Education method: Explanation and Demonstration Education comprehension: verbalized understanding and returned demonstration  HOME EXERCISE PROGRAM: Access Code: [email or web address] URL: [email or web address] Date: 03/05/2024 Prepared by: Rosaria Powell-Butler  Exercises - Lying Prone  - 2 x daily - 7 x weekly - 3 sets - 5 min hold - Cat Cow  - 2 x daily - 7 x weekly - 3 sets - 10 reps - Supine Bridge  - 2 x daily - 7 x weekly - 3 sets - 10 reps - Sidelying Thoracic Rotation with Open Book  - 2 x daily - 7 x weekly - 3 sets - 10 reps  ASSESSMENT:  CLINICAL IMPRESSION: Patient is a 58 y.o. female who was seen today for physical therapy evaluation and treatment for  M54.50,G89.29 (ICD-10-CM) - Chronic midline low back pain without sciatica  . On this date, patient demonstrates impaired self perception of function  via Modified Oswestry, decreased/impaired lumbar ROM/mobility, decreased/impaired  LE/hip strength, and decreased endurance, all of which may be contributing to patient's increased pain, decreased activity tolerance, difficulty with transfers, and impairing their overall function/QOL. Patient appears to be more hindered with thoracic pain versus low back pain this date. Patient exhibits slight extension preference but instructed to use pain as her guide when performing HEP.  Patient will benefit from skilled physical therapy to address the above/below deficits in order to improve pain and overall function.    OBJECTIVE IMPAIRMENTS: decreased activity tolerance, decreased endurance, decreased mobility, decreased ROM, decreased strength, impaired perceived functional ability, increased muscle spasms, and pain.   ACTIVITY LIMITATIONS: carrying, lifting, bending, sitting,  standing, squatting, sleeping, and transfers  PARTICIPATION LIMITATIONS: meal prep, cleaning, laundry, driving, community activity, and yard work  PERSONAL FACTORS: Past/current experiences, Social background, and Time since onset of injury/illness/exacerbation are also affecting patient's functional outcome.   REHAB POTENTIAL: Good  CLINICAL DECISION MAKING: Stable/uncomplicated  EVALUATION COMPLEXITY: Low   GOALS: Goals reviewed with patient? No  SHORT TERM GOALS: Target date: 03/26/24 Patient will be independent with performance of HEP to demonstrate adequate self management of symptoms.  Baseline:  Goal status: INITIAL  2.   Patient will report at least a 25% improvement with function and/or pain reduction overall since beginning PT. Baseline:  Goal status: INITIAL   LONG TERM GOALS: Target date: 04/16/24 Patient will improve Modified Oswestry score by 12.8 % in order to demonstrate improved self-perceived disability and overall function while meeting MCID.  Baseline: Goal status: INITIAL   2.  Patient will improve  30 second sit to stand test by 2 STS  in order to demonstrate improved LE strength and endurance required for prolonged standing activities. Baseline:  Goal status: INITIAL   3.  Patient will report decreased pain rating of 4/10 or less at the end of her typical day to demonstrate progress with pain reduction and improved activity tolerance. Baseline:  Goal status: INITIAL   4.  Patient will report overall 50% improvement since beginning PT. Baseline:  Goal status: INITIAL   PLAN:  PT FREQUENCY: 2x/week  PT DURATION: 6 weeks  PLANNED INTERVENTIONS: 97164- PT Re-evaluation, 97110-Therapeutic exercises, 97530- Therapeutic activity, [id number]- Neuromuscular re-education, 97535- Self Care, 02859- Manual therapy, [id number]- Electrical stimulation (manual), [id number]- Ultrasound, 02987- Traction (mechanical), [phone] (1-2 muscles), 20561 (3+ muscles)- Dry Needling,  Patient/Family education, Taping, Joint mobilization, Spinal mobilization, Cryotherapy, and Moist heat.  PLAN FOR NEXT SESSION: Review goals and HEP, trapezius MMT, thoracic and lumbar activities, hip strengthening    12:28 PM, March 23, 2024 Rosaria Settler, PT, DPT  Re[facility] - [place]    Managed Medicaid Authorization Request Treatment Start Date: Mar 23, 2024  Visit Dx Codes:  M54.59 M54.9 Z74.09  Functional Tool Score:  Modified Oswestry Low Back Pain Disability Questionnaire: 18 / 50 = 36.0 % 30 seconds chair stand test: 8.5 STS  For all possible CPT codes, reference the Planned Interventions line above.     Check all conditions that are expected to impact treatment: {Conditions expected to impact treatment:None of these apply   If treatment provided at initial evaluation, no treatment charged due to lack of authorization.

## 2024-03-17 ENCOUNTER — Ambulatory Visit (HOSPITAL_COMMUNITY)

## 2024-03-17 ENCOUNTER — Encounter (HOSPITAL_COMMUNITY): Payer: Self-pay

## 2024-03-17 DIAGNOSIS — M549 Dorsalgia, unspecified: Secondary | ICD-10-CM | POA: Insufficient documentation

## 2024-03-17 DIAGNOSIS — M5459 Other low back pain: Secondary | ICD-10-CM | POA: Diagnosis present

## 2024-03-17 DIAGNOSIS — Z7409 Other reduced mobility: Secondary | ICD-10-CM | POA: Diagnosis present

## 2024-03-17 NOTE — Therapy (Signed)
 OUTPATIENT PHYSICAL THERAPY THORACOLUMBAR TREATMENT   Patient Name: Kaylee Ochoa MRN: [id number] DOB:12-26-1965, 58 y.o., female Today's Date: 03/17/2024  END OF SESSION:  PT End of Session - 03/17/24 1255     Visit Number 2    Number of Visits 13    Date for Recertification  04/02/24    Authorization Type [employer]    Authorization Time Period [employer] approved 5 visits from 03/05/2024-05/03/2024    Authorization - Visit Number 1    Authorization - Number of Visits 5    Progress Note Due on Visit 10    PT Start Time 1258    PT Stop Time 1332    PT Time Calculation (min) 34 min    Activity Tolerance Patient tolerated treatment well    Behavior During Therapy WFL for tasks assessed/performed          Past Medical History:  Diagnosis Date   Depression    ETOH abuse    Hypertension    Renal disorder    Past Surgical History:  Procedure Laterality Date   BREAST SURGERY     cyst removal on breast     DILITATION & CURRETTAGE/HYSTROSCOPY WITH NOVASURE ABLATION N/A 02/27/2016   Procedure: DILATATION & CURETTAGE/HYSTEROSCOPY  Procedure #2;  Surgeon: Norleen LULLA Server, MD;  Location: [facility];  Service: Gynecology;  Laterality: N/A;   LAPAROSCOPIC BILATERAL SALPINGECTOMY N/A 02/27/2016   Procedure: LAPAROSCOPIC BILATERAL SALPINGECTOMY; LAPAROSCOPIC LYSIS OF ADHESIONS Procedure #1;  Surgeon: Norleen LULLA Server, MD;  Location: [facility];  Service: Gynecology;  Laterality: N/A;   Patient Active Problem List   Diagnosis Date Noted   Chronic midline low back pain without sciatica 10/01/2016   Uterine fibroid, intramural 02/27/2016   Encounter for sterilization bilateral salpingectomy 02/27/2016   Dysmenorrhea 06/01/2015   Alcohol  dependence with alcohol -induced mood disorder (HCC)    Elevated LFTs    MDD (major depressive disorder) 09/14/2014   Bereavement 05/05/2014   Alcohol  use disorder, severe, dependence (HCC) 05/04/2014   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   SUBJECTIVE:  SUBJECTIVE STATEMENT: 03/17/24:  Pt reports pain scale 6/10 constant burning from lower back up to bra strap.  Feels the weather play a part and waiting on approval for pain medication.    Eval:  Patient reports she's had back pain for 8 years, fell in 2017 when she slipped on ice. MD gave her a pain medication and she's been using that over the years to manage pain. New MD has referred her to PT and changed her medications.  Reports pain feels like burning sensation, tinging, and soreness. Reports it starts at low back and creeps up to mid back. Worse with lifting, improves with laying down but still feels sore.  Reports pain starts to kick in 2 hours after getting up and going and its worse at night.   PERTINENT HISTORY:  N/A  PAIN:  Are you having pain? 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 Following up with pain clinic  OBJECTIVE:  Note: Objective measures were completed at Evaluation unless otherwise noted.  DIAGNOSTIC FINDINGS:  Spinal imaging: 3 images of the lumbar spine the patient's plumb line is to the left pelvic heights look a little off balance with the left side being higher lateral x-ray shows a mild spondylolisthesis of L4 on L5 with anterior superior endplate irregularities at L5 L3 and L2 disc spaces look fairly preserved in the lumbar area.   Mild facet arthritis L5-S1   Impression   Grade 1 spondylolisthesis of L4 on L5   Mild spondylosis  PATIENT SURVEYS:  Modified Oswestry:   Modified Oswestry Low Back Pain Disability Questionnaire: 18 / 50 = 36.0 %  Interpretation of scores: Score Category Description  0-20% Minimal Disability The patient can cope with most living activities. Usually no treatment is indicated apart from advice on lifting, sitting and exercise  21-40% Moderate Disability The patient experiences more pain and difficulty with sitting, lifting and standing. Travel and social life are more difficult and they may be disabled from work. Personal care, sexual activity and sleeping are not grossly affected, and the patient can usually be managed by conservative means  41-60% Severe Disability Pain remains the main problem in this group, but activities of daily living are affected. These patients require a detailed investigation  61-80% Crippled Back pain impinges on all aspects of the patient's life. Positive intervention is required  81-100% Bed-bound These  patients are either bed-bound or exaggerating their symptoms  Bluford FORBES Zoe DELENA Karon DELENA, et al. Surgery versus conservative management of stable thoracolumbar fracture: the PRESTO feasibility RCT. [place] ([place]): [employer]; 2021 Nov. [facility] Technology Assessment, No. 25.62.) Appendix 3, Oswestry Disability Index category descriptors. Available from: Findjewelers.cz  Minimally Clinically Important Difference (MCID) =  12.8%  COGNITION: Overall cognitive status: Within functional limits for tasks assessed     SENSATION: WFL Reports decreased sensation in R upper/mid thoracic region, intermittent   POSTURE:  Rounded shoulders Slightly increased thoracic kyphosis  PALPATION: TTP in lower thoracic spine, muscle spasms in paraspinals with CPA, grade 2 Pain relief with grade 1-2 CPA throughout lumbar spine   LUMBAR ROM:   AROM Eval  Flexion WFL * at end range  Extension 50% * in mid back   Right lateral flexion ~1 inch past knee *  Left lateral flexion ~3 inch past knee  Right rotation 75% avail*  Left rotation 75% avail   (Blank rows = not tested)  *=painful  LOWER EXTREMITY ROM:     Active  Right eval Left eval  Hip flexion    Hip extension    Hip abduction    Hip adduction    Hip internal rotation    Hip external rotation    Knee flexion    Knee extension    Ankle dorsiflexion    Ankle plantarflexion    Ankle inversion    Ankle eversion     (Blank rows = not tested)  LOWER EXTREMITY MMT:    MMT Right eval Left eval  Hip flexion 4+ 4+  Hip extension 4- 4-  Hip abduction 4- 4-  Hip adduction    Hip internal rotation    Hip external rotation    Knee flexion    Knee extension    Ankle dorsiflexion 5 5  Ankle plantarflexion    Ankle inversion    Ankle eversion    Upper trap 5 5  Mid trap 4- 4-  Lower trap     (Blank rows = not tested)  LUMBAR SPECIAL TESTS:    FUNCTIONAL TESTS:  30 seconds chair stand  test: 8.5 STS, pain increases around rep 6  GAIT: Distance walked: 75 ft in session Assistive device utilized: None Level of assistance: Complete Independence Comments: WFL, no significant deviations  TREATMENT DATE:  03/17/24: Reviewed goals  Educated importance of HEP compliance MMT for traps Prone: POE x 2 min Press-up 5x 10 Heel squeeze: 10x 5 Seated:  3D Thoracic excursion 5x   03/05/24: PT Eval and HEP                                                                                                                                 PATIENT EDUCATION:  Education details: PT evaluation, objective findings, POC, Importance of HEP, Precautions, Clinic policies, Anatomy and Physiology Person educated: Patient Education method: Explanation and Demonstration Education comprehension: verbalized understanding and returned demonstration  HOME EXERCISE PROGRAM: Access Code: [email or web address] URL: [email or web address] Date: 03/05/2024 Prepared by: Rosaria Powell-Butler  Exercises - Lying Prone  - 2 x daily - 7 x weekly - 3 sets - 5 min hold - Cat Cow  - 2 x daily - 7 x weekly - 3 sets - 10  reps - Supine Bridge  - 2 x daily - 7 x weekly - 3 sets - 10 reps - Sidelying Thoracic Rotation with Open Book  - 2 x daily - 7 x weekly - 3 sets - 10 reps  03/17/24: - Prone Press Up  - 2 x daily - 7 x weekly - 1 sets - 5 reps - 10 hold - Discussed benefits with walking program  ASSESSMENT:  CLINICAL IMPRESSION: 03/17/24:  Reviewed goals and educated importance of HEP compliance for maximal benefits, reports compliance with HEP 3 days a week without questions.  MMT complete for traps with noted weakness.  Session focus with core and proximal strengthening and mobility.  Positive reports with prone position and lumbar extension with reports of no pain in back.  Add prone press- up and thoracic excursion for mobility to HEP with printout given and verbalized understanding.  Pain reduced  to 4/10 at EOS.  Eval:  Patient is a 58 y.o. female who was seen today for physical therapy evaluation and treatment for  M54.50,G89.29 (ICD-10-CM) - Chronic midline low back pain without sciatica  . On this date, patient demonstrates impaired self perception of function via Modified Oswestry, decreased/impaired lumbar ROM/mobility, decreased/impaired  LE/hip strength, and decreased endurance, all of which may be contributing to patient's increased pain, decreased activity tolerance, difficulty with transfers, and impairing their overall function/QOL. Patient appears to be more hindered with thoracic pain versus low back pain this date. Patient exhibits slight extension preference but instructed to use pain as her guide when performing HEP.  Patient will benefit from skilled physical therapy to address the above/below deficits in order to improve pain and overall function.    OBJECTIVE IMPAIRMENTS: decreased activity tolerance, decreased endurance, decreased mobility, decreased ROM, decreased strength, impaired perceived functional ability, increased muscle spasms, and pain.   ACTIVITY LIMITATIONS: carrying, lifting, bending, sitting, standing, squatting, sleeping, and transfers  PARTICIPATION LIMITATIONS: meal prep, cleaning, laundry, driving, community activity, and yard work  PERSONAL FACTORS: Past/current experiences, Social background, and Time since onset of injury/illness/exacerbation are also affecting patient's functional outcome.   REHAB POTENTIAL: Good  CLINICAL DECISION MAKING: Stable/uncomplicated  EVALUATION COMPLEXITY: Low   GOALS: Goals reviewed with patient? No  SHORT TERM GOALS: Target date: 03/26/24 Patient will be independent with performance of HEP to demonstrate adequate self management of symptoms.  Baseline:  Goal status: INITIAL  2.   Patient will report at least a 25% improvement with function and/or pain reduction overall since beginning PT. Baseline:  Goal  status: INITIAL   LONG TERM GOALS: Target date: 04/16/24 Patient will improve Modified Oswestry score by 12.8 % in order to demonstrate improved self-perceived disability and overall function while meeting MCID.  Baseline: Goal status: INITIAL   2.  Patient will improve  30 second sit to stand test by 2 STS  in order to demonstrate improved LE strength and endurance required for prolonged standing activities. Baseline:  Goal status: INITIAL   3.  Patient will report decreased pain rating of 4/10 or less at the end of her typical day to demonstrate progress with pain reduction and improved activity tolerance. Baseline:  Goal status: INITIAL   4.  Patient will report overall 50% improvement since beginning PT. Baseline:  Goal status: INITIAL   PLAN:  PT FREQUENCY: 2x/week  PT DURATION: 6 weeks  PLANNED INTERVENTIONS: 97164- PT Re-evaluation, 97110-Therapeutic exercises, 97530- Therapeutic activity, [id number]- Neuromuscular re-education, 97535- Self Care, [id number]- Manual therapy, [id number]- Electrical stimulation (manual), [id number]- Ultrasound, 02987-  Traction (mechanical), 79439 (1-2 muscles), 20561 (3+ muscles)- Dry Needling, Patient/Family education, Taping, Joint mobilization, Spinal mobilization, Cryotherapy, and Moist heat.  PLAN FOR NEXT SESSION: Thoracic and lumbar activities, hip strengthening  Augustin Mclean, LPTA/CLT; CBIS [phone]  3:29 PM, 03/17/24

## 2024-03-25 ENCOUNTER — Ambulatory Visit (HOSPITAL_COMMUNITY): Admitting: Physical Therapy

## 2024-03-25 DIAGNOSIS — M5459 Other low back pain: Secondary | ICD-10-CM

## 2024-03-25 DIAGNOSIS — Z7409 Other reduced mobility: Secondary | ICD-10-CM

## 2024-03-25 DIAGNOSIS — M549 Dorsalgia, unspecified: Secondary | ICD-10-CM

## 2024-03-25 NOTE — Therapy (Signed)
 OUTPATIENT PHYSICAL THERAPY THORACOLUMBAR TREATMENT   Patient Name: Kaylee Ochoa MRN: [id number] DOB:September 20, 1965, 58 y.o., female Today's Date: 03/25/2024  END OF SESSION:  PT End of Session - 03/25/24 1028     Visit Number 3    Number of Visits 13    Date for Recertification  04/02/24    Authorization Type [employer]    Authorization Time Period [employer] approved 5 visits from 03/05/2024-05/03/2024    Authorization - Number of Visits 5    Progress Note Due on Visit 10    PT Start Time 0950    PT Stop Time 1036    PT Time Calculation (min) 46 min    Activity Tolerance Patient tolerated treatment well    Behavior During Therapy WFL for tasks assessed/performed           Past Medical History:  Diagnosis Date   Depression    ETOH abuse    Hypertension    Renal disorder    Past Surgical History:  Procedure Laterality Date   BREAST SURGERY     cyst removal on breast     DILITATION & CURRETTAGE/HYSTROSCOPY WITH NOVASURE ABLATION N/A 02/27/2016   Procedure: DILATATION & CURETTAGE/HYSTEROSCOPY  Procedure #2;  Surgeon: Norleen LULLA Server, MD;  Location: [facility];  Service: Gynecology;  Laterality: N/A;   LAPAROSCOPIC BILATERAL SALPINGECTOMY N/A 02/27/2016   Procedure: LAPAROSCOPIC BILATERAL SALPINGECTOMY; LAPAROSCOPIC LYSIS OF ADHESIONS Procedure #1;  Surgeon: Norleen LULLA Server, MD;  Location: [facility];  Service: Gynecology;  Laterality: N/A;   Patient Active Problem List   Diagnosis Date Noted   Chronic midline low back pain without sciatica 10/01/2016   Uterine fibroid, intramural 02/27/2016   Encounter for sterilization bilateral salpingectomy 02/27/2016   Dysmenorrhea 06/01/2015   Alcohol  dependence with alcohol -induced mood disorder (HCC)    Elevated LFTs    MDD (major depressive disorder) 09/14/2014   Bereavement 05/05/2014   Alcohol  use disorder, severe, dependence (HCC) 05/04/2014   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  ONSET DATE: Since 2017.   SUBJECTIVE:  SUBJECTIVE STATEMENT: 03/25/24:  Pt reports she was sore after last visit but got better after a couple days.  Pain remains around 5/10 and has one area of numbness Rt thoracic spine region.     Eval:  Patient reports she's had back pain for 8 years, fell in 2017 when she slipped on ice. MD gave her a pain medication and she's been using that over the years to manage pain. New MD has referred her to PT and changed her medications.  Reports pain feels like burning sensation, tinging, and soreness. Reports it starts at low back and creeps up to mid back. Worse with lifting, improves with laying down but still feels sore.  Reports pain starts to kick in 2 hours after getting up and going and its worse at night.   PERTINENT HISTORY:  N/A  PAIN:  Are you having pain? 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 Following up with pain clinic  OBJECTIVE:  Note: Objective measures were completed at Evaluation unless otherwise noted.  DIAGNOSTIC FINDINGS:  Spinal imaging: 3 images of the lumbar spine the patient's plumb line is to the left pelvic heights look a little off balance with the left side being higher lateral x-ray shows a mild spondylolisthesis of L4 on L5 with anterior superior endplate irregularities at L5 L3 and L2 disc spaces look fairly preserved in the lumbar area.   Mild facet arthritis L5-S1   Impression   Grade 1 spondylolisthesis of L4 on L5   Mild spondylosis  PATIENT SURVEYS:  Modified Oswestry:   Modified Oswestry Low Back Pain Disability Questionnaire: 18 / 50 = 36.0 %  Interpretation of scores: Score Category Description  0-20% Minimal Disability The patient can cope with most living activities. Usually no treatment is indicated apart from advice on lifting, sitting and exercise  21-40% Moderate Disability The patient experiences more pain and difficulty with sitting, lifting and standing. Travel and social life are more difficult and they may be disabled from work. Personal care, sexual activity and sleeping are not grossly affected, and the patient can usually be managed by conservative means  41-60% Severe Disability Pain remains the main problem in this group, but activities of daily living are affected. These patients require a detailed investigation  61-80% Crippled Back pain impinges on all aspects of the patients life. Positive intervention is required  81-100% Bed-bound These patients are either bed-bound or exaggerating their symptoms  Bluford FORBES Zoe DELENA Karon DELENA, et al. Surgery versus conservative management of stable thoracolumbar fracture: the PRESTO feasibility RCT. [place] ([place]): [employer]; 2021 Nov. [facility] Technology Assessment, No. 25.62.) Appendix 3, Oswestry Disability Index category descriptors. Available from: Findjewelers.cz  Minimally Clinically Important  Difference (MCID) = 12.8%  COGNITION: Overall cognitive status: Within functional limits for tasks assessed     SENSATION: WFL Reports decreased sensation in R upper/mid thoracic region, intermittent   POSTURE:  Rounded shoulders Slightly increased thoracic kyphosis  PALPATION: TTP in lower thoracic spine, muscle spasms in paraspinals with CPA, grade 2 Pain relief with grade 1-2 CPA throughout lumbar spine   LUMBAR ROM:   AROM Eval  Flexion WFL * at end range  Extension 50% * in mid back   Right lateral flexion ~1 inch past knee *  Left lateral flexion ~3 inch past knee  Right rotation 75% avail*  Left rotation 75% avail   (Blank rows = not tested)  *=painful  LOWER EXTREMITY MMT:    MMT Right eval Left eval  Hip flexion 4+ 4+  Hip extension 4- 4-  Hip abduction 4- 4-  Hip adduction    Hip internal rotation    Hip external rotation    Knee flexion    Knee extension    Ankle dorsiflexion 5 5  Ankle plantarflexion    Ankle inversion    Ankle eversion    Upper trap 5 5  Mid trap 4- 4-  Lower trap     (Blank rows = not tested)  LUMBAR SPECIAL TESTS:    FUNCTIONAL TESTS:  30 seconds chair stand test: 8.5 STS, pain increases around rep 6  GAIT: Distance walked: 75 ft in session Assistive device utilized: None Level of assistance: Complete Independence Comments: WFL, no significant deviations  TREATMENT DATE:  03/25/24: Nustep seat 8, level 3, 5 minutes UE/LE Seated: 3D Thoracic excursion with UE movements 5X each Prone:  POE 2 minutes  Press ups 2X5  Heelsqueeze 10X5 2 sets Supine: bridge  10X  SLR 10X each LE   03/17/24: Reviewed goals  Educated importance of HEP compliance MMT for traps Prone: POE x 2 min Press-up 5x 10 Heel squeeze: 10x 5 Seated:  3D Thoracic excursion 5x   03/05/24: PT Eval and HEP                                                                                                                                 PATIENT EDUCATION:  Education details: PT evaluation, objective findings, POC, Importance of HEP, Precautions, Clinic policies, Anatomy and Physiology Person educated: Patient Education method: Explanation and Demonstration Education comprehension: verbalized understanding and returned demonstration  HOME EXERCISE PROGRAM: Access Code: [email or web address] URL: [email or web address] Date: 03/05/2024 Prepared by: Rosaria Powell-Butler  Exercises - Lying Prone  - 2 x daily - 7 x weekly - 3 sets - 5 min hold - Cat Cow  - 2 x daily - 7 x weekly - 3 sets - 10 reps - Supine Bridge  - 2 x daily - 7 x weekly - 3 sets - 10 reps - Sidelying Thoracic Rotation with Open Book  - 2 x daily - 7 x weekly - 3 sets -  10 reps  03/17/24: - Prone Press Up  - 2 x daily - 7 x weekly - 1 sets - 5 reps - 10 hold - Discussed benefits with walking program  ASSESSMENT:  CLINICAL IMPRESSION: 03/25/24:  Began on nustep for mm warmup and continued with established therex including thoracic mobility and lumbar extension based exercises.  Pt with good form completing theses activities and without pain voiced or visualized.  Added supine bridge for glute strenghtening and SLR to work on core and LE strength.  Pt reported feeling stronger and overall improving since beginning therapy.  Pt will continue to benefit from skilled therapy.     Eval:  Patient is a 58 y.o. female who was seen today for physical therapy evaluation and treatment for  M54.50,G89.29 (ICD-10-CM) - Chronic midline low back pain without sciatica  . On this date, patient demonstrates impaired  self perception of function via Modified Oswestry, decreased/impaired lumbar ROM/mobility, decreased/impaired  LE/hip strength, and decreased endurance, all of which may be contributing to patient's increased pain, decreased activity tolerance, difficulty with transfers, and impairing their overall function/QOL. Patient appears to be more hindered with thoracic pain versus low back pain this date. Patient exhibits slight extension preference but instructed to use pain as her guide when performing HEP.  Patient will benefit from skilled physical therapy to address the above/below deficits in order to improve pain and overall function.    OBJECTIVE IMPAIRMENTS: decreased activity tolerance, decreased endurance, decreased mobility, decreased ROM, decreased strength, impaired perceived functional ability, increased muscle spasms, and pain.   ACTIVITY LIMITATIONS: carrying, lifting, bending, sitting, standing, squatting, sleeping, and transfers  PARTICIPATION LIMITATIONS: meal prep, cleaning, laundry, driving, community activity, and yard work  PERSONAL FACTORS: Past/current experiences, Social background, and Time since onset of injury/illness/exacerbation are also affecting patient's functional outcome.   REHAB POTENTIAL: Good  CLINICAL DECISION MAKING: Stable/uncomplicated  EVALUATION COMPLEXITY: Low   GOALS: Goals reviewed with patient? No  SHORT TERM GOALS: Target date: 03/26/24 Patient will be independent with performance of HEP to demonstrate adequate self management of symptoms.  Baseline:  Goal status: INITIAL  2.   Patient will report at least a 25% improvement with function and/or pain reduction overall since beginning PT. Baseline:  Goal status: INITIAL   LONG TERM GOALS: Target date: 04/16/24 Patient will improve Modified Oswestry score by 12.8 % in order to demonstrate improved self-perceived disability and overall function while meeting MCID.  Baseline: Goal status:  INITIAL   2.  Patient will improve  30 second sit to stand test by 2 STS  in order to demonstrate improved LE strength and endurance required for prolonged standing activities. Baseline:  Goal status: INITIAL   3.  Patient will report decreased pain rating of 4/10 or less at the end of her typical day to demonstrate progress with pain reduction and improved activity tolerance. Baseline:  Goal status: INITIAL   4.  Patient will report overall 50% improvement since beginning PT. Baseline:  Goal status: INITIAL   PLAN:  PT FREQUENCY: 2x/week  PT DURATION: 6 weeks  PLANNED INTERVENTIONS: 97164- PT Re-evaluation, 97110-Therapeutic exercises, 97530- Therapeutic activity, [id number]- Neuromuscular re-education, 97535- Self Care, [id number]- Manual therapy, [id number]- Electrical stimulation (manual), [id number]- Ultrasound, 02987- Traction (mechanical), [phone] (1-2 muscles), 20561 (3+ muscles)- Dry Needling, Patient/Family education, Taping, Joint mobilization, Spinal mobilization, Cryotherapy, and Moist heat.  PLAN FOR NEXT SESSION: Thoracic and lumbar activities, hip strengthening. Begin postural strengthening  Greig KATHEE Fuse, PTA/CLT [facility]  Ph: [phone]  10:53 AM, 03/25/2024

## 2024-03-31 ENCOUNTER — Ambulatory Visit (HOSPITAL_COMMUNITY)

## 2024-03-31 ENCOUNTER — Encounter (HOSPITAL_COMMUNITY): Payer: Self-pay

## 2024-03-31 DIAGNOSIS — M549 Dorsalgia, unspecified: Secondary | ICD-10-CM

## 2024-03-31 DIAGNOSIS — M5459 Other low back pain: Secondary | ICD-10-CM

## 2024-03-31 DIAGNOSIS — Z7409 Other reduced mobility: Secondary | ICD-10-CM

## 2024-03-31 NOTE — Therapy (Signed)
 OUTPATIENT PHYSICAL THERAPY THORACOLUMBAR TREATMENT   Patient Name: Kaylee Ochoa MRN: [id number] DOB:[birth year], 58 y.o., female Today's Date: 03/31/2024  END OF SESSION:  PT End of Session - 03/31/24 0948     Visit Number 4    Number of Visits 13    Date for Recertification  04/02/24    Authorization Type [employer]    Authorization Time Period [employer] approved 5 visits from 03/05/2024-05/03/2024    Authorization - Visit Number 3    Authorization - Number of Visits 5    Progress Note Due on Visit 10    PT Start Time [phone]    PT Stop Time 1026    PT Time Calculation (min) 39 min    Activity Tolerance Patient tolerated treatment well    Behavior During Therapy WFL for tasks assessed/performed           Past Medical History:  Diagnosis Date   Depression    ETOH abuse    Hypertension    Renal disorder    Past Surgical History:  Procedure Laterality Date   BREAST SURGERY     cyst removal on breast     DILITATION & CURRETTAGE/HYSTROSCOPY WITH NOVASURE ABLATION N/A 02/27/2016   Procedure: DILATATION & CURETTAGE/HYSTEROSCOPY  Procedure #2;  Surgeon: Norleen LULLA Server, MD;  Location: [facility];  Service: Gynecology;  Laterality: N/A;   LAPAROSCOPIC BILATERAL SALPINGECTOMY N/A 02/27/2016   Procedure: LAPAROSCOPIC BILATERAL SALPINGECTOMY; LAPAROSCOPIC LYSIS OF ADHESIONS Procedure #1;  Surgeon: Norleen LULLA Server, MD;  Location: [facility];  Service: Gynecology;  Laterality: N/A;   Patient Active Problem List   Diagnosis Date Noted   Chronic midline low back pain without sciatica 10/01/2016   Uterine fibroid, intramural 02/27/2016   Encounter for sterilization bilateral salpingectomy 02/27/2016   Dysmenorrhea 06/01/2015   Alcohol  dependence with alcohol -induced mood disorder (HCC)    Elevated LFTs    MDD (major depressive disorder) 09/14/2014   Bereavement 05/05/2014   Alcohol  use disorder, severe, dependence (HCC) 05/04/2014   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   SUBJECTIVE:  SUBJECTIVE STATEMENT: 03/31/24:  Pain scale 5/10 burning in middle back.  Pain medication taken 30 minutes prior session today.  Eval:  Patient reports she's had back pain for 8 years, fell in 2017 when she slipped on ice. MD gave her a pain medication and she's been using that over the years to manage pain. New MD has referred her to PT and changed her medications.  Reports pain feels like burning sensation, tinging, and soreness. Reports it starts at low back and creeps up to mid back. Worse with lifting, improves with laying down but still feels sore.  Reports pain starts to kick in 2 hours after getting up and going and its worse at night.   PERTINENT HISTORY:  N/A  PAIN:  Are you having pain? 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 Following up with pain clinic  OBJECTIVE:  Note: Objective measures were completed at Evaluation unless otherwise noted.  DIAGNOSTIC FINDINGS:  Spinal imaging: 3 images of the lumbar spine the patient's plumb line is to the left pelvic heights look a little off balance with the left side being higher lateral x-ray shows a mild spondylolisthesis of L4 on L5 with anterior superior endplate irregularities at L5 L3 and L2 disc spaces look fairly preserved in the lumbar area.   Mild facet arthritis L5-S1   Impression   Grade 1 spondylolisthesis of L4 on L5   Mild spondylosis  PATIENT SURVEYS:  Modified Oswestry:   Modified Oswestry Low Back Pain Disability Questionnaire: 18 / 50 = 36.0 %  Interpretation of scores: Score Category Description  0-20% Minimal Disability The patient can cope with most living activities. Usually no treatment is indicated apart from advice on lifting, sitting and exercise  21-40% Moderate Disability The patient experiences more pain and difficulty with sitting, lifting and standing. Travel and social life are more difficult and they may be disabled from work. Personal care, sexual activity and sleeping are not grossly affected, and the patient can usually be managed by conservative means  41-60% Severe Disability Pain remains the main problem in this group, but activities of daily living are affected. These patients require a detailed investigation  61-80% Crippled Back pain impinges on all aspects of the patients life. Positive intervention is required  81-100% Bed-bound These patients are either bed-bound or exaggerating their  symptoms  Bluford FORBES Zoe DELENA Karon DELENA, et al. Surgery versus conservative management of stable thoracolumbar fracture: the PRESTO feasibility RCT. [place] ([place]): [employer]; 2021 Nov. [facility] Technology Assessment, No. 25.62.) Appendix 3, Oswestry Disability Index category descriptors. Available from: Findjewelers.cz  Minimally Clinically Important Difference (MCID) = 12.8%  COGNITION: Overall cognitive status: Within functional limits for tasks assessed  SENSATION: WFL Reports decreased sensation in R upper/mid thoracic region, intermittent   POSTURE:  Rounded shoulders Slightly increased thoracic kyphosis  PALPATION: TTP in lower thoracic spine, muscle spasms in paraspinals with CPA, grade 2 Pain relief with grade 1-2 CPA throughout lumbar spine   LUMBAR ROM:   AROM Eval  Flexion WFL * at end range  Extension 50% * in mid back   Right lateral flexion ~1 inch past knee *  Left lateral flexion ~3 inch past knee  Right rotation 75% avail*  Left rotation 75% avail   (Blank rows = not tested)  *=painful  LOWER EXTREMITY MMT:    MMT Right eval Left eval  Hip flexion 4+ 4+  Hip extension 4- 4-  Hip abduction 4- 4-  Hip adduction    Hip internal rotation    Hip external rotation    Knee flexion    Knee extension    Ankle dorsiflexion 5 5  Ankle plantarflexion    Ankle inversion    Ankle eversion    Upper trap 5 5  Mid trap 4- 4-  Lower trap     (Blank rows = not tested)  LUMBAR SPECIAL TESTS:    FUNCTIONAL TESTS:  30 seconds chair stand test: 8.5 STS, pain increases around rep 6  GAIT: Distance walked: 75 ft in session Assistive device utilized: None Level of assistance: Complete Independence Comments: WFL, no significant deviations  TREATMENT DATE:  03/31/24: Nustep seat 8, level 3, 5 minutes UE/LE Standing:  RTB shoulder extension 10x RTB Rows 10x Wall arch 12x 5 Squat front of chair- crepitus Lt  knee Quadruped: Bird Dog 5x 5 Prone: Press up 5x 10    03/25/24: Nustep seat 8, level 3, 5 minutes UE/LE Seated: 3D Thoracic excursion with UE movements 5X each Prone:  POE 2 minutes  Press ups 2X5  Heelsqueeze 10X5 2 sets Supine: bridge 10X  SLR 10X each LE   03/17/24: Reviewed goals  Educated importance of HEP compliance MMT for traps Prone: POE x 2 min Press-up 5x 10 Heel squeeze: 10x 5 Seated:  3D Thoracic excursion 5x   03/05/24: PT Eval and HEP                                                                                                                                 PATIENT EDUCATION:  Education details: PT evaluation, objective findings, POC, Importance of HEP, Precautions, Clinic policies, Anatomy and Physiology Person educated: Patient Education method: Explanation and Demonstration Education comprehension: verbalized understanding and returned demonstration  HOME EXERCISE PROGRAM: Access Code: [email or web address] URL: [email or web address] Date: 03/05/2024 Prepared by: Rosaria Powell-Butler  Exercises - Lying Prone  - 2 x daily - 7 x weekly - 3 sets - 5 min hold - Cat Cow  - 2 x daily - 7 x weekly - 3 sets - 10 reps - Supine Bridge  - 2 x daily - 7 x weekly - 3  sets - 10 reps - Sidelying Thoracic Rotation with Open Book  - 2 x daily - 7 x weekly - 3 sets - 10 reps  03/17/24: - Prone Press Up  - 2 x daily - 7 x weekly - 1 sets - 5 reps - 10 hold - Discussed benefits with walking program  ASSESSMENT:  CLINICAL IMPRESSION: 03/31/24:  Session focus with core and proximal strengthening.  Added several new interventions for postural, core and functional strengthening.  Began theraband postural strenghtening with cueing for controlled movements and increased hold time, dead bug with initial balance instability that improved with reps and squats for functioanl strengthening complete in front of chair with cueing for mechanics.  No reports of  increased pain through session, limited by appropriate levels of fatigue.   Eval:  Patient is a 58 y.o. female who was seen today for physical therapy evaluation and treatment for  M54.50,G89.29 (ICD-10-CM) - Chronic midline low back pain without sciatica  . On this date, patient demonstrates impaired self perception of function via Modified Oswestry, decreased/impaired lumbar ROM/mobility, decreased/impaired  LE/hip strength, and decreased endurance, all of which may be contributing to patient's increased pain, decreased activity tolerance, difficulty with transfers, and impairing their overall function/QOL. Patient appears to be more hindered with thoracic pain versus low back pain this date. Patient exhibits slight extension preference but instructed to use pain as her guide when performing HEP.  Patient will benefit from skilled physical therapy to address the above/below deficits in order to improve pain and overall function.    OBJECTIVE IMPAIRMENTS: decreased activity tolerance, decreased endurance, decreased mobility, decreased ROM, decreased strength, impaired perceived functional ability, increased muscle spasms, and pain.   ACTIVITY LIMITATIONS: carrying, lifting, bending, sitting, standing, squatting, sleeping, and transfers  PARTICIPATION LIMITATIONS: meal prep, cleaning, laundry, driving, community activity, and yard work  PERSONAL FACTORS: Past/current experiences, Social background, and Time since onset of injury/illness/exacerbation are also affecting patient's functional outcome.   REHAB POTENTIAL: Good  CLINICAL DECISION MAKING: Stable/uncomplicated  EVALUATION COMPLEXITY: Low   GOALS: Goals reviewed with patient? No  SHORT TERM GOALS: Target date: 03/26/24 Patient will be independent with performance of HEP to demonstrate adequate self management of symptoms.  Baseline:  Goal status: INITIAL  2.   Patient will report at least a 25% improvement with function and/or  pain reduction overall since beginning PT. Baseline:  Goal status: INITIAL   LONG TERM GOALS: Target date: 04/16/24 Patient will improve Modified Oswestry score by 12.8 % in order to demonstrate improved self-perceived disability and overall function while meeting MCID.  Baseline: Goal status: INITIAL   2.  Patient will improve  30 second sit to stand test by 2 STS  in order to demonstrate improved LE strength and endurance required for prolonged standing activities. Baseline:  Goal status: INITIAL   3.  Patient will report decreased pain rating of 4/10 or less at the end of her typical day to demonstrate progress with pain reduction and improved activity tolerance. Baseline:  Goal status: INITIAL   4.  Patient will report overall 50% improvement since beginning PT. Baseline:  Goal status: INITIAL   PLAN:  PT FREQUENCY: 2x/week  PT DURATION: 6 weeks  PLANNED INTERVENTIONS: 97164- PT Re-evaluation, 97110-Therapeutic exercises, 97530- Therapeutic activity, [id number]- Neuromuscular re-education, 97535- Self Care, [id number]- Manual therapy, [id number]- Electrical stimulation (manual), [id number]- Ultrasound, 02987- Traction (mechanical), [phone] (1-2 muscles), [id number] (3+ muscles)- Dry Needling, Patient/Family education, Taping, Joint mobilization, Spinal mobilization, Cryotherapy, and Moist heat.  PLAN FOR  NEXT SESSION: Thoracic and lumbar activities, hip strengthening. Progress functional strengthening.    Augustin Mclean, LPTA/CLT; [facility] [phone] 3:35 PM, 03/31/2024

## 2024-04-02 ENCOUNTER — Encounter (HOSPITAL_COMMUNITY): Payer: Self-pay

## 2024-04-02 ENCOUNTER — Ambulatory Visit (HOSPITAL_COMMUNITY)

## 2024-04-02 DIAGNOSIS — M5459 Other low back pain: Secondary | ICD-10-CM

## 2024-04-02 DIAGNOSIS — Z7409 Other reduced mobility: Secondary | ICD-10-CM

## 2024-04-02 DIAGNOSIS — M549 Dorsalgia, unspecified: Secondary | ICD-10-CM

## 2024-04-02 NOTE — Therapy (Signed)
 PHYSICAL THERAPY DISCHARGE SUMMARY  Visits from Start of Care: 5  Current functional level related to goals / functional outcomes: See last visit on 04/02/24   Remaining deficits: See last visit on 04/02/24   Education / Equipment: HEP   Patient agrees to discharge. Patient goals were met. Patient is being discharged due to being pleased with the current functional level.  2:50 PM, 04/02/2024 Rosaria Settler, PT, DPT [facility] [place]

## 2024-04-02 NOTE — Therapy (Signed)
 " OUTPATIENT PHYSICAL THERAPY THORACOLUMBAR TREATMENT Progress Note Reporting Period 03/05/24 to 04/02/24  See note below for Objective Data and Assessment of Progress/Goals.       Patient Name: Kaylee Ochoa MRN: [id number] DOB:Sep 03, 1965, 58 y.o., female Today's Date: 04/02/2024  END OF SESSION:  PT End of Session - 04/02/24 0845     Visit Number 5    Number of Visits 13    Authorization Type [employer]    Authorization Time Period [employer] approved 5 visits from 03/05/2024-05/03/2024    Authorization - Visit Number 4    Authorization - Number of Visits 5    Progress Note Due on Visit 10    PT Start Time 0847    PT Stop Time 0941    PT Time Calculation (min) 54 min           Past Medical History:  Diagnosis Date   Depression    ETOH abuse    Hypertension    Renal disorder    Past Surgical History:  Procedure Laterality Date   BREAST SURGERY     cyst removal on breast     DILITATION & CURRETTAGE/HYSTROSCOPY WITH NOVASURE ABLATION N/A 02/27/2016   Procedure: DILATATION & CURETTAGE/HYSTEROSCOPY  Procedure #2;  Surgeon: Norleen LULLA Server, MD;  Location: [facility];  Service: Gynecology;  Laterality: N/A;   LAPAROSCOPIC BILATERAL SALPINGECTOMY N/A 02/27/2016   Procedure: LAPAROSCOPIC BILATERAL SALPINGECTOMY; LAPAROSCOPIC LYSIS OF ADHESIONS Procedure #1;  Surgeon: Norleen LULLA Server, MD;  Location: [facility];  Service: Gynecology;  Laterality: N/A;   Patient Active Problem List   Diagnosis Date Noted   Chronic midline low back pain without sciatica 10/01/2016   Uterine fibroid, intramural 02/27/2016   Encounter for sterilization bilateral salpingectomy 02/27/2016   Dysmenorrhea 06/01/2015   Alcohol  dependence with alcohol -induced mood disorder (HCC)    Elevated LFTs    MDD (major depressive disorder) 09/14/2014   Bereavement 05/05/2014   Alcohol  use disorder, severe, dependence (HCC) 05/04/2014   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   SUBJECTIVE:  SUBJECTIVE STATEMENT: 04/02/24:  Pt reports some soreness following last session.  Pain scale 5/10 burning in center of middle to lower back.  Feels she has improved awareness of posture and improved 30% since beginning therapy.  Reports she is taking pain medication less frequently.  Eval:  Patient reports she's had back pain for 8 years, fell in 2017 when she slipped on ice. MD gave her a pain medication and she's been using that over the years to manage pain. New MD has referred her to PT and changed her medications.  Reports pain feels like burning sensation, tinging, and soreness. Reports it starts at low back and creeps up to mid back. Worse with lifting, improves with laying down but still feels sore.  Reports pain starts to kick in 2 hours after getting up and going and its worse at night.   PERTINENT HISTORY:  N/A  PAIN:  Are you having pain? Yes: NPRS scale: 5/10 current (can get up to 8/10) Pain location: low back up to mid back Pain description: burning, tingling, soreness Aggravating factors: lifting, driving a lot (3 hours sitting), Repetitive motion like bending and extending when covering pool Relieving factors: medication, laying down on floor on stomach at least 10 min (needs flat surface)  PRECAUTIONS: None  RED  FLAGS: None   WEIGHT BEARING RESTRICTIONS: No  FALLS:  Has patient fallen in last 6 months? 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 Following up with pain clinic  OBJECTIVE:  Note: Objective measures were completed at Evaluation unless otherwise noted.  DIAGNOSTIC FINDINGS:  Spinal imaging: 3 images of the lumbar spine the patient's plumb line is to the left pelvic heights look a little off balance with the left side being higher lateral x-ray shows a mild spondylolisthesis of L4 on L5 with anterior superior endplate irregularities at L5 L3 and L2 disc spaces look fairly preserved in the lumbar area.   Mild facet arthritis L5-S1   Impression   Grade 1 spondylolisthesis of L4 on L5   Mild spondylosis  PATIENT SURVEYS:  Modified Oswestry:   Modified Oswestry Low Back Pain Disability Questionnaire: 18 / 50 = 36.0 %   Modified Oswestry:  MODIFIED OSWESTRY DISABILITY SCALE  Date: 04/02/24: Score  Pain intensity 4 =  Pain medication provides me with little relief from pain.  2. Personal care (washing, dressing, etc.) 0 =  I can take care of myself normally without causing increased pain.  3. Lifting 2 = Pain prevents me from lifting heavy weights off the floor,but I can manage if the weights are conveniently positioned (e.g. on a table)  4. Walking 0 = Pain does not prevent me from walking any distance  5. Sitting 0 =  I can sit in any chair as long as I like.  6. Standing 0 =  I can stand as long as I want without increased pain.  7. Sleeping 0 = Pain does not prevent me from sleeping well.  8. Social Life 1 =  My social life is normal, but it increases my level of pain.   9. Traveling 1 =  I can travel anywhere, but it increases my pain.  10.  Employment/ Homemaking 1 = My normal homemaking/job activities increase my pain, but I can still perform all that is required of me  Total 9/50= 18%   Interpretation of scores: Score Category Description  0-20% Minimal Disability The patient can cope with most living activities. Usually no treatment is indicated apart from advice on lifting, sitting and exercise  21-40% Moderate Disability The patient experiences more pain and difficulty with sitting, lifting and standing. Travel and social life are more difficult and they may be disabled from work. Personal care, sexual activity and sleeping are not grossly affected, and the patient can usually be managed by conservative means  41-60% Severe Disability Pain remains the main problem in this group, but activities of daily living are affected. These patients require a detailed investigation  61-80% Crippled Back pain impinges on all aspects of the patients life. Positive intervention is required  81-100% Bed-bound These patients are either bed-bound or exaggerating their symptoms  Bluford FORBES Zoe DELENA Karon DELENA, et al. Surgery versus conservative management of stable thoracolumbar fracture: the PRESTO feasibility RCT. [place]): [employer]; 2021 Nov. [facility] Technology Assessment, No. 25.62.) Appendix 3, Oswestry Disability Index category descriptors. Available from: [email or web address].cz  Minimally Clinically Important Difference (MCID) = 12.8%  Interpretation of scores: Score Category Description  0-20% Minimal Disability The patient can cope with most living activities. Usually no treatment is indicated apart from advice on lifting, sitting and exercise  21-40% Moderate Disability The patient experiences more pain and difficulty with sitting, lifting and standing. Travel and social life are more difficult and they may be disabled  from work. Personal care, sexual activity and sleeping are not grossly affected, and the patient can usually be managed by conservative means  41-60% Severe Disability Pain remains the main problem in this group, but activities of daily living are affected. These patients require a detailed investigation  61-80% Crippled Back pain impinges on all aspects of the patients life. Positive intervention is required  81-100% Bed-bound These patients are either bed-bound or exaggerating their symptoms  Bluford FORBES Zoe DELENA Karon DELENA, et al. Surgery versus conservative management of stable thoracolumbar fracture: the PRESTO feasibility RCT. [place]): [employer]; 2021 Nov. [facility] Technology Assessment, No. 25.62.) Appendix 3, Oswestry Disability Index category descriptors. Available from: [email or web address].cz  Minimally Clinically Important Difference (MCID) = 12.8%  COGNITION: Overall cognitive status: Within functional limits for tasks assessed     SENSATION: WFL Reports decreased sensation in R upper/mid thoracic region, intermittent   POSTURE:  Rounded shoulders Slightly increased thoracic kyphosis  PALPATION: TTP in lower thoracic spine, muscle spasms in paraspinals with CPA, grade 2 Pain relief with grade 1-2 CPA throughout lumbar spine   LUMBAR ROM:   AROM Eval 04/02/24:  Flexion WFL * at end range [facility] pain free  Extension 50% * in mid back  [facility]  Right lateral flexion ~1 inch past knee * 3 in past knee  Left lateral flexion ~3 inch past knee 4in past knee  Right rotation 75% avail* WFL   Left rotation 75% avail WFL   (Blank rows = not tested)  *=painful  LOWER EXTREMITY MMT:    MMT Right eval Left eval Right 04/02/24 Left 04/02/24  Hip flexion 4+ 4+ 5 5  Hip extension 4- 4- 4 4+  Hip abduction 4- 4- 4+ 4+  Hip adduction      Hip internal rotation      Hip external rotation      Knee flexion  5 5  Knee extension      Ankle  dorsiflexion 5 5    Ankle plantarflexion      Ankle inversion      Ankle eversion      Upper trap 5 5    Mid trap 4- 4- 4 4-  Lower trap   4 4-   (Blank rows = not tested)  LUMBAR SPECIAL TESTS:    FUNCTIONAL TESTS:  30 seconds chair stand test: 8.5 STS, pain increases around rep 6 04/02/24: 30 second chair stand test: 13 no LBP, pain in Lt knee  GAIT: Distance walked: 75 ft in session Assistive device utilized: None Level of assistance: Complete Independence Comments: WFL, no significant deviations  TREATMENT DATE:  04/02/24: 511ft no AD no increased pain. ODI 9/50= 18% was 36% 30 second chair stand test: 13 no LBP, pain in Lt knee ROM MMT see above Discussed current HEP Prone:  rows 2# 10  Shoulder extension 2# 10x  03/31/24: Nustep seat 8, level 3, 5 minutes UE/LE Standing:  RTB shoulder extension 10x RTB Rows 10x Wall arch 12x 5 Squat front of chair- crepitus Lt knee Quadruped: Bird Dog 5x 5 Prone: Press up 5x 10    03/25/24: Nustep seat 8, level 3, 5 minutes UE/LE Seated: 3D Thoracic excursion with UE movements 5X each Prone:  POE 2 minutes  Press ups 2X5  Heelsqueeze 10X5 2 sets Supine: bridge 10X  SLR 10X each LE   03/17/24: Reviewed goals  Educated importance of HEP compliance MMT for traps Prone: POE x 2 min Press-up 5x 10 Heel squeeze: 10x 5 Seated:  3D Thoracic excursion 5x   03/05/24: PT Eval and HEP                                                                                                                                 PATIENT EDUCATION:  Education details: PT evaluation, objective findings, POC, Importance of HEP, Precautions, Clinic policies, Anatomy and Physiology Person educated: Patient Education method: Explanation and Demonstration Education comprehension: verbalized understanding and returned demonstration  HOME EXERCISE PROGRAM: Access Code: [email or web address] URL: [email or web address] Date:  03/05/2024 Prepared by: Rosaria Powell-Butler  Exercises - Lying Prone  - 2 x daily - 7 x weekly - 3 sets - 5 min hold - Cat Cow  - 2 x daily - 7 x weekly - 3 sets - 10 reps - Supine Bridge  - 2 x daily - 7 x weekly - 3 sets - 10 reps - Sidelying Thoracic Rotation with Open Book  - 2 x daily - 7 x weekly - 3 sets - 10 reps  03/17/24: - Prone Press Up  - 2 x daily - 7 x weekly - 1 sets - 5 reps - 10 hold - Discussed benefits with walking program  ASSESSMENT:  CLINICAL IMPRESSION: 04/02/24:  Reviewed goals with the following findings:  Pt has met 2/2 STG and 3/4 LTGs.  Pt reports compliance with HEP 2x/week and feels she has improved by 30%.  Objective findings include improve lumbar mobility, proximal strengthening and improved self perceived functional ability with Oswestry survey.  Reviewed findings with pt and discussed goals.  Pt continues to have weakness in postural musculature and limited by pain though reports improvements.  Added postural strengthening exercises to HEP with printout given and verbalized understanding.  Pt stated she plans to increase compliance with HEP, has gym and pool at home.  DC to HEP.   Eval:  Patient is a 58 y.o. female who was seen today for physical therapy evaluation and treatment for  M54.50,G89.29 (ICD-10-CM) - Chronic midline low back pain without sciatica  . On this date, patient demonstrates impaired self perception of function via Modified Oswestry, decreased/impaired lumbar ROM/mobility, decreased/impaired  LE/hip strength, and decreased endurance, all of which may be contributing to patient's increased pain, decreased activity tolerance, difficulty with transfers, and impairing their overall function/QOL. Patient appears to be more hindered with thoracic pain versus low back pain this date. Patient exhibits slight extension preference but instructed to use pain as her guide when performing HEP.  Patient will benefit from skilled physical therapy to  address the above/below deficits in order to improve pain and overall function.    OBJECTIVE IMPAIRMENTS: decreased activity tolerance, decreased endurance, decreased mobility, decreased ROM, decreased strength, impaired perceived functional ability, increased muscle spasms, and pain.   ACTIVITY LIMITATIONS: carrying, lifting, bending, sitting, standing, squatting, sleeping, and transfers  PARTICIPATION LIMITATIONS: meal prep, cleaning, laundry, driving, community activity, and yard work  PERSONAL FACTORS: Past/current experiences, Social background, and Time since onset of injury/illness/exacerbation are also affecting patient's functional outcome.   REHAB POTENTIAL: Good  CLINICAL DECISION MAKING: Stable/uncomplicated  EVALUATION COMPLEXITY: Low   GOALS: Goals reviewed with patient? No  SHORT TERM GOALS: Target date: 03/26/24 Patient will be independent with performance of HEP to demonstrate adequate self management of symptoms.  Baseline: 04/02/24:  Reports compliance with HEP 2 days a week Goal status:MET  2.   Patient will report at least a 25% improvement with function and/or pain reduction overall since beginning PT. Baseline: 04/02/24:  Feels improvements by 30% Goal status: MET   LONG TERM GOALS: Target date: 04/16/24 Patient will improve Modified Oswestry score by 12.8 % in order to demonstrate improved self-perceived disability and overall function while meeting MCID.  Baseline: 04/02/24:  9/50= 18% Goal status: MET   2.  Patient will improve  30 second sit to stand test by 2 STS  in order to demonstrate improved LE strength and endurance required for prolonged standing activities. Baseline: 04/02/24:  30 second STS improved to 13 was 8.5 Goal status: MET   3.  Patient will report decreased pain rating of 4/10 or less at the end of her typical day to demonstrate progress with pain reduction and improved activity tolerance. Baseline: 04/02/24:average pain scale  4/10 Goal status: MET   4.  Patient will report overall 50% improvement since beginning PT. Baseline: 04/02/24:  Feels improvements by 30% Goal status: In progress   PLAN:  PT FREQUENCY: 2x/week  PT DURATION: 6 weeks  PLANNED INTERVENTIONS: 97164- PT Re-evaluation, 97110-Therapeutic exercises, 97530- Therapeutic activity, [id number]- Neuromuscular re-education, 97535- Self Care, 02859- Manual therapy, [id number]- Electrical stimulation (manual), [id number]- Ultrasound, [id number]- Traction (mechanical), [id number] (1-2 muscles), 20561 (3+ muscles)- Dry Needling, Patient/Family education, Taping, Joint mobilization, Spinal mobilization, Cryotherapy, and Moist heat.  PLAN FOR NEXT SESSION: DC to HEP  Augustin Mclean, LPTA/CLT; WILLAIM  [phone] 10:52 AM, 04/02/2024       "

## 2024-04-05 ENCOUNTER — Ambulatory Visit (HOSPITAL_COMMUNITY)

## 2024-04-12 ENCOUNTER — Ambulatory Visit (HOSPITAL_COMMUNITY)

## 2024-04-16 ENCOUNTER — Ambulatory Visit (HOSPITAL_COMMUNITY)

## 2024-04-20 ENCOUNTER — Ambulatory Visit (HOSPITAL_COMMUNITY)

## 2024-04-22 ENCOUNTER — Ambulatory Visit (HOSPITAL_COMMUNITY)
